# Patient Record
Sex: Male | Born: 1965
Health system: Southern US, Community
[De-identification: ages and names within clinical notes are randomized; demographics above are authoritative.]

## PROBLEM LIST (undated history)

## (undated) ENCOUNTER — Ambulatory Visit: Admission: EM | Source: Home / Self Care

## (undated) DIAGNOSIS — E042 Nontoxic multinodular goiter: Secondary | ICD-10-CM

## (undated) DIAGNOSIS — F528 Other sexual dysfunction not due to a substance or known physiological condition: Secondary | ICD-10-CM

## (undated) DIAGNOSIS — E119 Type 2 diabetes mellitus without complications: Secondary | ICD-10-CM

## (undated) DIAGNOSIS — E785 Hyperlipidemia, unspecified: Secondary | ICD-10-CM

## (undated) DIAGNOSIS — R7989 Other specified abnormal findings of blood chemistry: Secondary | ICD-10-CM

## (undated) DIAGNOSIS — J309 Allergic rhinitis, unspecified: Secondary | ICD-10-CM

## (undated) DIAGNOSIS — N183 Chronic kidney disease, stage 3 unspecified: Secondary | ICD-10-CM

## (undated) DIAGNOSIS — N189 Chronic kidney disease, unspecified: Secondary | ICD-10-CM

## (undated) DIAGNOSIS — H532 Diplopia: Secondary | ICD-10-CM

## (undated) HISTORY — DX: Other sexual dysfunction not due to a substance or known physiological condition: F52.8

## (undated) HISTORY — DX: Diplopia: H53.2

## (undated) HISTORY — DX: Hyperlipidemia, unspecified: E78.5

## (undated) HISTORY — DX: Nontoxic multinodular goiter: E04.2

## (undated) HISTORY — DX: Chronic kidney disease, unspecified: N18.9

## (undated) HISTORY — DX: Type 2 diabetes mellitus without complications: E11.9

## (undated) HISTORY — DX: Chronic kidney disease, stage 3 unspecified: N18.30

## (undated) HISTORY — DX: Allergic rhinitis, unspecified: J30.9

## (undated) HISTORY — DX: Other specified abnormal findings of blood chemistry: R79.89

---

## 1998-07-18 ENCOUNTER — Encounter: Payer: Self-pay | Admitting: Internal Medicine

## 1998-07-18 ENCOUNTER — Ambulatory Visit (HOSPITAL_COMMUNITY): Admission: RE | Admit: 1998-07-18 | Discharge: 1998-07-18 | Payer: Self-pay | Admitting: Internal Medicine

## 2002-02-08 ENCOUNTER — Encounter: Payer: Self-pay | Admitting: Family Medicine

## 2002-02-08 ENCOUNTER — Encounter: Admission: RE | Admit: 2002-02-08 | Discharge: 2002-02-08 | Payer: Self-pay | Admitting: Family Medicine

## 2003-06-11 ENCOUNTER — Encounter: Admission: RE | Admit: 2003-06-11 | Discharge: 2003-09-09 | Payer: Self-pay | Admitting: Family Medicine

## 2003-09-18 ENCOUNTER — Encounter: Admission: RE | Admit: 2003-09-18 | Discharge: 2003-09-18 | Payer: Self-pay | Admitting: Family Medicine

## 2004-07-15 ENCOUNTER — Emergency Department (HOSPITAL_COMMUNITY): Admission: EM | Admit: 2004-07-15 | Discharge: 2004-07-15 | Payer: Self-pay | Admitting: Emergency Medicine

## 2006-07-29 ENCOUNTER — Ambulatory Visit: Payer: Self-pay | Admitting: Family Medicine

## 2006-08-06 ENCOUNTER — Ambulatory Visit: Payer: Self-pay | Admitting: Family Medicine

## 2006-11-03 ENCOUNTER — Ambulatory Visit: Payer: Self-pay | Admitting: Family Medicine

## 2006-11-04 ENCOUNTER — Ambulatory Visit: Payer: Self-pay | Admitting: Family Medicine

## 2006-11-04 LAB — CONVERTED CEMR LAB
ALT: 30 units/L (ref 0–40)
AST: 38 units/L — ABNORMAL HIGH (ref 0–37)
Chol/HDL Ratio, serum: 4.9
Cholesterol: 178 mg/dL (ref 0–200)
Creatinine,U: 191.1 mg/dL
HCT: 43.8 % (ref 39.0–52.0)
HDL: 36.1 mg/dL — ABNORMAL LOW (ref >39.0)
Hemoglobin: 14.5 g/dL (ref 13.0–17.0)
Hgb A1c MFr Bld: 5.9 % (ref 4.6–6.0)
LDL Cholesterol: 109 mg/dL — ABNORMAL HIGH (ref 0–99)
MCHC: 33 g/dL (ref 30.0–36.0)
MCV: 88.1 fL (ref 78.0–100.0)
Microalb Creat Ratio: 28.3 mg/g (ref 0.0–30.0)
Microalb, Ur: 5.4 mg/dL — ABNORMAL HIGH (ref 0.0–1.9)
Platelets: 227 10*3/uL (ref 150–400)
RBC: 4.98 M/uL (ref 4.22–5.81)
RDW: 11.3 % — ABNORMAL LOW (ref 11.5–14.6)
Triglyceride fasting, serum: 164 mg/dL — ABNORMAL HIGH (ref 0–149)
VLDL: 33 mg/dL (ref 0–40)
WBC: 4.2 10*3/uL — ABNORMAL LOW (ref 4.5–10.5)

## 2006-12-07 ENCOUNTER — Ambulatory Visit: Payer: Self-pay | Admitting: Family Medicine

## 2006-12-07 LAB — CONVERTED CEMR LAB
ALT: 33 units/L (ref 0–40)
AST: 38 units/L — ABNORMAL HIGH (ref 0–37)

## 2007-02-02 ENCOUNTER — Ambulatory Visit: Payer: Self-pay | Admitting: Family Medicine

## 2007-02-02 LAB — CONVERTED CEMR LAB
ALT: 30 units/L (ref 0–40)
AST: 35 units/L (ref 0–37)
BUN: 17 mg/dL (ref 6–23)
CO2: 30 meq/L (ref 19–32)
Calcium: 9.4 mg/dL (ref 8.4–10.5)
Chloride: 106 meq/L (ref 96–112)
Cholesterol: 172 mg/dL (ref 0–200)
Creatinine, Ser: 1 mg/dL (ref 0.4–1.5)
Creatinine,U: 191.4 mg/dL
GFR calc Af Amer: 105 mL/min
GFR calc non Af Amer: 87 mL/min
Glucose, Bld: 123 mg/dL — ABNORMAL HIGH (ref 70–99)
HDL: 37.5 mg/dL — ABNORMAL LOW (ref >39.0)
Hgb A1c MFr Bld: 5.6 % (ref 4.6–6.0)
LDL Cholesterol: 103 mg/dL — ABNORMAL HIGH (ref 0–99)
Microalb Creat Ratio: 46.5 mg/g — ABNORMAL HIGH (ref 0.0–30.0)
Microalb, Ur: 8.9 mg/dL — ABNORMAL HIGH (ref 0.0–1.9)
Potassium: 3.5 meq/L (ref 3.5–5.1)
Sodium: 141 meq/L (ref 135–145)
Total CHOL/HDL Ratio: 4.6
Triglycerides: 158 mg/dL — ABNORMAL HIGH (ref 0–149)
VLDL: 32 mg/dL (ref 0–40)

## 2007-03-11 ENCOUNTER — Ambulatory Visit: Payer: Self-pay | Admitting: Family Medicine

## 2007-03-13 LAB — CONVERTED CEMR LAB
ALT: 27 units/L (ref 0–40)
AST: 34 units/L (ref 0–37)
Cholesterol: 138 mg/dL (ref 0–200)
Creatinine,U: 183.7 mg/dL
HDL: 35.8 mg/dL — ABNORMAL LOW (ref >39.0)
Hgb A1c MFr Bld: 5.7 % (ref 4.6–6.0)
LDL Cholesterol: 80 mg/dL (ref 0–99)
Microalb Creat Ratio: 30.5 mg/g — ABNORMAL HIGH (ref 0.0–30.0)
Microalb, Ur: 5.6 mg/dL — ABNORMAL HIGH (ref 0.0–1.9)
Total CHOL/HDL Ratio: 3.9
Triglycerides: 109 mg/dL (ref 0–149)
VLDL: 22 mg/dL (ref 0–40)

## 2007-03-15 DIAGNOSIS — J309 Allergic rhinitis, unspecified: Secondary | ICD-10-CM

## 2007-03-15 DIAGNOSIS — E785 Hyperlipidemia, unspecified: Secondary | ICD-10-CM | POA: Insufficient documentation

## 2007-03-15 DIAGNOSIS — F528 Other sexual dysfunction not due to a substance or known physiological condition: Secondary | ICD-10-CM

## 2007-03-15 DIAGNOSIS — E119 Type 2 diabetes mellitus without complications: Secondary | ICD-10-CM

## 2007-03-15 HISTORY — DX: Type 2 diabetes mellitus without complications: E11.9

## 2007-03-15 HISTORY — DX: Hyperlipidemia, unspecified: E78.5

## 2007-03-15 HISTORY — DX: Allergic rhinitis, unspecified: J30.9

## 2007-03-15 HISTORY — DX: Other sexual dysfunction not due to a substance or known physiological condition: F52.8

## 2007-04-12 ENCOUNTER — Telehealth (INDEPENDENT_AMBULATORY_CARE_PROVIDER_SITE_OTHER): Payer: Self-pay | Admitting: *Deleted

## 2007-04-18 ENCOUNTER — Telehealth (INDEPENDENT_AMBULATORY_CARE_PROVIDER_SITE_OTHER): Payer: Self-pay | Admitting: *Deleted

## 2007-05-06 ENCOUNTER — Ambulatory Visit: Payer: Self-pay | Admitting: Family Medicine

## 2007-08-03 ENCOUNTER — Ambulatory Visit: Payer: Self-pay | Admitting: Family Medicine

## 2007-08-04 ENCOUNTER — Encounter (INDEPENDENT_AMBULATORY_CARE_PROVIDER_SITE_OTHER): Payer: Self-pay | Admitting: *Deleted

## 2007-08-04 ENCOUNTER — Telehealth (INDEPENDENT_AMBULATORY_CARE_PROVIDER_SITE_OTHER): Payer: Self-pay | Admitting: *Deleted

## 2007-08-04 LAB — CONVERTED CEMR LAB
ALT: 35 units/L (ref 0–53)
AST: 36 units/L (ref 0–37)
BUN: 18 mg/dL (ref 6–23)
CO2: 30 meq/L (ref 19–32)
Calcium: 9.3 mg/dL (ref 8.4–10.5)
Chloride: 106 meq/L (ref 96–112)
Cholesterol: 185 mg/dL (ref 0–200)
Creatinine, Ser: 1.1 mg/dL (ref 0.4–1.5)
Creatinine,U: 219 mg/dL
GFR calc Af Amer: 94 mL/min
GFR calc non Af Amer: 78 mL/min
Glucose, Bld: 110 mg/dL — ABNORMAL HIGH (ref 70–99)
HDL: 35.8 mg/dL — ABNORMAL LOW (ref >39.0)
Hgb A1c MFr Bld: 5.5 % (ref 4.6–6.0)
LDL Cholesterol: 130 mg/dL — ABNORMAL HIGH (ref 0–99)
Microalb Creat Ratio: 22.4 mg/g (ref 0.0–30.0)
Microalb, Ur: 4.9 mg/dL — ABNORMAL HIGH (ref 0.0–1.9)
Potassium: 3.8 meq/L (ref 3.5–5.1)
Sodium: 143 meq/L (ref 135–145)
Total CHOL/HDL Ratio: 5.2
Triglycerides: 98 mg/dL (ref 0–149)
VLDL: 20 mg/dL (ref 0–40)

## 2007-08-19 ENCOUNTER — Ambulatory Visit: Payer: Self-pay | Admitting: Family Medicine

## 2007-11-21 ENCOUNTER — Ambulatory Visit: Payer: Self-pay | Admitting: Family Medicine

## 2007-11-22 ENCOUNTER — Encounter (INDEPENDENT_AMBULATORY_CARE_PROVIDER_SITE_OTHER): Payer: Self-pay | Admitting: *Deleted

## 2007-11-22 LAB — CONVERTED CEMR LAB
ALT: 39 units/L (ref 0–53)
AST: 35 units/L (ref 0–37)
Cholesterol: 117 mg/dL (ref 0–200)
HDL: 33.1 mg/dL — ABNORMAL LOW (ref >39.0)
Hgb A1c MFr Bld: 5.5 % (ref 4.6–6.0)
LDL Cholesterol: 69 mg/dL (ref 0–99)
PSA: 0.89 ng/mL (ref 0.10–4.00)
Total CHOL/HDL Ratio: 3.5
Triglycerides: 74 mg/dL (ref 0–149)
VLDL: 15 mg/dL (ref 0–40)

## 2008-02-23 ENCOUNTER — Encounter: Payer: Self-pay | Admitting: Internal Medicine

## 2008-02-28 ENCOUNTER — Ambulatory Visit: Payer: Self-pay | Admitting: Internal Medicine

## 2008-03-07 ENCOUNTER — Telehealth (INDEPENDENT_AMBULATORY_CARE_PROVIDER_SITE_OTHER): Payer: Self-pay | Admitting: *Deleted

## 2008-03-07 LAB — CONVERTED CEMR LAB
ALT: 43 units/L (ref 0–53)
AST: 41 units/L — ABNORMAL HIGH (ref 0–37)
BUN: 25 mg/dL — ABNORMAL HIGH (ref 6–23)
Basophils Absolute: 0 10*3/uL (ref 0.0–0.1)
Basophils Relative: 0.8 % (ref 0.0–1.0)
CO2: 30 meq/L (ref 19–32)
Calcium: 9.2 mg/dL (ref 8.4–10.5)
Chloride: 105 meq/L (ref 96–112)
Cholesterol: 155 mg/dL (ref 0–200)
Creatinine, Ser: 1.1 mg/dL (ref 0.4–1.5)
Eosinophils Absolute: 0.2 10*3/uL (ref 0.0–0.7)
Eosinophils Relative: 4.2 % (ref 0.0–5.0)
GFR calc Af Amer: 94 mL/min
GFR calc non Af Amer: 77 mL/min
Glucose, Bld: 129 mg/dL — ABNORMAL HIGH (ref 70–99)
HCT: 41.4 % (ref 39.0–52.0)
HDL: 30.8 mg/dL — ABNORMAL LOW (ref >39.0)
Hemoglobin: 13.7 g/dL (ref 13.0–17.0)
LDL Cholesterol: 109 mg/dL — ABNORMAL HIGH (ref 0–99)
Lymphocytes Relative: 25.9 % (ref 12.0–46.0)
MCHC: 33.1 g/dL (ref 30.0–36.0)
MCV: 88.1 fL (ref 78.0–100.0)
Monocytes Absolute: 0.8 10*3/uL (ref 0.1–1.0)
Monocytes Relative: 19.4 % — ABNORMAL HIGH (ref 3.0–12.0)
Neutro Abs: 2 10*3/uL (ref 1.4–7.7)
Neutrophils Relative %: 49.7 % (ref 43.0–77.0)
Platelets: 198 10*3/uL (ref 150–400)
Potassium: 3.6 meq/L (ref 3.5–5.1)
RBC: 4.69 M/uL (ref 4.22–5.81)
RDW: 11.8 % (ref 11.5–14.6)
Sodium: 140 meq/L (ref 135–145)
TSH: 0.69 microintl units/mL (ref 0.35–5.50)
Total CHOL/HDL Ratio: 5
Triglycerides: 75 mg/dL (ref 0–149)
VLDL: 15 mg/dL (ref 0–40)
WBC: 4 10*3/uL — ABNORMAL LOW (ref 4.5–10.5)

## 2008-03-13 ENCOUNTER — Ambulatory Visit: Payer: Self-pay | Admitting: Internal Medicine

## 2008-06-14 ENCOUNTER — Ambulatory Visit: Payer: Self-pay | Admitting: Internal Medicine

## 2008-06-19 ENCOUNTER — Telehealth (INDEPENDENT_AMBULATORY_CARE_PROVIDER_SITE_OTHER): Payer: Self-pay | Admitting: *Deleted

## 2008-06-19 LAB — CONVERTED CEMR LAB
ALT: 65 units/L — ABNORMAL HIGH (ref 0–53)
AST: 52 units/L — ABNORMAL HIGH (ref 0–37)
Cholesterol: 191 mg/dL (ref 0–200)
HDL: 33.3 mg/dL — ABNORMAL LOW (ref >39.0)
Hgb A1c MFr Bld: 5.9 % (ref 4.6–6.0)
LDL Cholesterol: 140 mg/dL — ABNORMAL HIGH (ref 0–99)
Total CHOL/HDL Ratio: 5.7
Triglycerides: 87 mg/dL (ref 0–149)
VLDL: 17 mg/dL (ref 0–40)

## 2008-11-02 ENCOUNTER — Telehealth (INDEPENDENT_AMBULATORY_CARE_PROVIDER_SITE_OTHER): Payer: Self-pay | Admitting: *Deleted

## 2008-11-28 ENCOUNTER — Ambulatory Visit: Payer: Self-pay | Admitting: Internal Medicine

## 2008-11-28 LAB — CONVERTED CEMR LAB
Creatinine,U: 209.1 mg/dL
Hgb A1c MFr Bld: 5.8 % (ref 4.6–6.0)
Microalb Creat Ratio: 47.8 mg/g — ABNORMAL HIGH (ref 0.0–30.0)
Microalb, Ur: 10 mg/dL — ABNORMAL HIGH (ref 0.0–1.9)

## 2008-12-03 ENCOUNTER — Telehealth (INDEPENDENT_AMBULATORY_CARE_PROVIDER_SITE_OTHER): Payer: Self-pay | Admitting: *Deleted

## 2008-12-03 ENCOUNTER — Ambulatory Visit: Payer: Self-pay | Admitting: Internal Medicine

## 2008-12-05 ENCOUNTER — Ambulatory Visit: Payer: Self-pay | Admitting: Internal Medicine

## 2008-12-10 ENCOUNTER — Encounter: Payer: Self-pay | Admitting: Internal Medicine

## 2008-12-10 LAB — CONVERTED CEMR LAB
BUN: 18 mg/dL (ref 6–23)
CO2: 31 meq/L (ref 19–32)
Calcium: 9.3 mg/dL (ref 8.4–10.5)
Chloride: 106 meq/L (ref 96–112)
Cholesterol: 179 mg/dL (ref 0–200)
Creatinine, Ser: 1.1 mg/dL (ref 0.4–1.5)
GFR calc Af Amer: 94 mL/min
GFR calc non Af Amer: 77 mL/min
Glucose, Bld: 122 mg/dL — ABNORMAL HIGH (ref 70–99)
HDL: 35.4 mg/dL — ABNORMAL LOW (ref >39.0)
Hgb A1c MFr Bld: 5.8 % (ref 4.6–6.0)
LDL Cholesterol: 123 mg/dL — ABNORMAL HIGH (ref 0–99)
Potassium: 3.6 meq/L (ref 3.5–5.1)
Sodium: 141 meq/L (ref 135–145)
Total CHOL/HDL Ratio: 5.1
Triglycerides: 101 mg/dL (ref 0–149)
VLDL: 20 mg/dL (ref 0–40)

## 2009-02-26 ENCOUNTER — Ambulatory Visit: Payer: Self-pay | Admitting: Internal Medicine

## 2009-06-04 ENCOUNTER — Encounter: Payer: Self-pay | Admitting: Internal Medicine

## 2009-06-04 LAB — HM DIABETES EYE EXAM: HM Diabetic Eye Exam: NORMAL

## 2009-06-24 ENCOUNTER — Encounter: Payer: Self-pay | Admitting: Internal Medicine

## 2009-06-27 ENCOUNTER — Ambulatory Visit: Payer: Self-pay | Admitting: Internal Medicine

## 2009-07-03 ENCOUNTER — Telehealth: Payer: Self-pay | Admitting: Internal Medicine

## 2009-07-03 LAB — CONVERTED CEMR LAB
ALT: 31 units/L (ref 0–53)
AST: 33 units/L (ref 0–37)
BUN: 19 mg/dL (ref 6–23)
Basophils Absolute: 0 10*3/uL (ref 0.0–0.1)
Basophils Relative: 0.1 % (ref 0.0–3.0)
CO2: 29 meq/L (ref 19–32)
Calcium: 9.4 mg/dL (ref 8.4–10.5)
Chloride: 101 meq/L (ref 96–112)
Cholesterol: 182 mg/dL (ref 0–200)
Creatinine, Ser: 1.1 mg/dL (ref 0.4–1.5)
Creatinine,U: 185.3 mg/dL
Eosinophils Absolute: 0.2 10*3/uL (ref 0.0–0.7)
Eosinophils Relative: 3.2 % (ref 0.0–5.0)
GFR calc non Af Amer: 92.95 mL/min (ref >60)
Glucose, Bld: 109 mg/dL — ABNORMAL HIGH (ref 70–99)
HCT: 40.2 % (ref 39.0–52.0)
HDL: 35.6 mg/dL — ABNORMAL LOW (ref >39.00)
Hemoglobin: 13.9 g/dL (ref 13.0–17.0)
LDL Cholesterol: 126 mg/dL — ABNORMAL HIGH (ref 0–99)
Lymphocytes Relative: 18 % (ref 12.0–46.0)
Lymphs Abs: 0.9 10*3/uL (ref 0.7–4.0)
MCHC: 34.6 g/dL (ref 30.0–36.0)
MCV: 85.2 fL (ref 78.0–100.0)
Microalb Creat Ratio: 20.5 mg/g (ref 0.0–30.0)
Microalb, Ur: 3.8 mg/dL — ABNORMAL HIGH (ref 0.0–1.9)
Monocytes Absolute: 0.9 10*3/uL (ref 0.1–1.0)
Monocytes Relative: 18.4 % — ABNORMAL HIGH (ref 3.0–12.0)
Neutro Abs: 2.8 10*3/uL (ref 1.4–7.7)
Neutrophils Relative %: 60.3 % (ref 43.0–77.0)
PSA: 0.91 ng/mL (ref 0.10–4.00)
Platelets: 205 10*3/uL (ref 150.0–400.0)
Potassium: 3.8 meq/L (ref 3.5–5.1)
RBC: 4.71 M/uL (ref 4.22–5.81)
RDW: 12 % (ref 11.5–14.6)
Sodium: 138 meq/L (ref 135–145)
TSH: 0.4 microintl units/mL (ref 0.35–5.50)
Total CHOL/HDL Ratio: 5
Triglycerides: 104 mg/dL (ref 0.0–149.0)
VLDL: 20.8 mg/dL (ref 0.0–40.0)
WBC: 4.8 10*3/uL (ref 4.5–10.5)

## 2009-07-04 ENCOUNTER — Encounter (INDEPENDENT_AMBULATORY_CARE_PROVIDER_SITE_OTHER): Payer: Self-pay | Admitting: *Deleted

## 2009-07-04 LAB — CONVERTED CEMR LAB: Hgb A1c MFr Bld: 5.6 % (ref 4.6–6.5)

## 2009-07-12 ENCOUNTER — Ambulatory Visit: Payer: Self-pay | Admitting: Internal Medicine

## 2009-07-12 LAB — HM DIABETES FOOT EXAM

## 2009-11-20 ENCOUNTER — Ambulatory Visit: Payer: Self-pay | Admitting: Internal Medicine

## 2009-11-21 ENCOUNTER — Ambulatory Visit: Payer: Self-pay | Admitting: Internal Medicine

## 2009-11-25 LAB — CONVERTED CEMR LAB
ALT: 34 units/L (ref 0–53)
AST: 28 units/L (ref 0–37)
Cholesterol: 119 mg/dL (ref 0–200)
HDL: 35.4 mg/dL — ABNORMAL LOW (ref >39.00)
Hgb A1c MFr Bld: 9 % — ABNORMAL HIGH (ref 4.6–6.5)
LDL Cholesterol: 64 mg/dL (ref 0–99)
Total CHOL/HDL Ratio: 3
Triglycerides: 97 mg/dL (ref 0.0–149.0)
VLDL: 19.4 mg/dL (ref 0.0–40.0)

## 2009-12-03 ENCOUNTER — Ambulatory Visit: Payer: Self-pay | Admitting: Internal Medicine

## 2009-12-04 LAB — CONVERTED CEMR LAB: Hgb A1c MFr Bld: 10.3 % — ABNORMAL HIGH (ref 4.6–6.5)

## 2009-12-09 ENCOUNTER — Ambulatory Visit: Payer: Self-pay | Admitting: Internal Medicine

## 2009-12-09 ENCOUNTER — Telehealth (INDEPENDENT_AMBULATORY_CARE_PROVIDER_SITE_OTHER): Payer: Self-pay | Admitting: *Deleted

## 2009-12-09 ENCOUNTER — Encounter (INDEPENDENT_AMBULATORY_CARE_PROVIDER_SITE_OTHER): Payer: Self-pay | Admitting: *Deleted

## 2009-12-09 LAB — CONVERTED CEMR LAB: Blood Glucose, Fingerstick: 422

## 2009-12-11 ENCOUNTER — Telehealth (INDEPENDENT_AMBULATORY_CARE_PROVIDER_SITE_OTHER): Payer: Self-pay | Admitting: *Deleted

## 2009-12-13 ENCOUNTER — Telehealth (INDEPENDENT_AMBULATORY_CARE_PROVIDER_SITE_OTHER): Payer: Self-pay | Admitting: *Deleted

## 2009-12-16 ENCOUNTER — Telehealth (INDEPENDENT_AMBULATORY_CARE_PROVIDER_SITE_OTHER): Payer: Self-pay | Admitting: *Deleted

## 2009-12-23 ENCOUNTER — Telehealth (INDEPENDENT_AMBULATORY_CARE_PROVIDER_SITE_OTHER): Payer: Self-pay | Admitting: *Deleted

## 2009-12-24 ENCOUNTER — Ambulatory Visit: Payer: Self-pay | Admitting: Internal Medicine

## 2009-12-24 DIAGNOSIS — H532 Diplopia: Secondary | ICD-10-CM

## 2009-12-24 HISTORY — DX: Diplopia: H53.2

## 2009-12-30 ENCOUNTER — Ambulatory Visit: Payer: Self-pay | Admitting: Endocrinology

## 2010-01-01 ENCOUNTER — Telehealth: Payer: Self-pay | Admitting: Endocrinology

## 2010-01-09 ENCOUNTER — Encounter: Admission: RE | Admit: 2010-01-09 | Discharge: 2010-01-09 | Payer: Self-pay | Admitting: Endocrinology

## 2010-01-13 ENCOUNTER — Ambulatory Visit: Payer: Self-pay | Admitting: Endocrinology

## 2010-03-21 ENCOUNTER — Ambulatory Visit: Payer: Self-pay | Admitting: Endocrinology

## 2010-03-21 DIAGNOSIS — E042 Nontoxic multinodular goiter: Secondary | ICD-10-CM | POA: Insufficient documentation

## 2010-03-21 HISTORY — DX: Nontoxic multinodular goiter: E04.2

## 2010-03-21 LAB — CONVERTED CEMR LAB
Cholesterol: 157 mg/dL (ref 0–200)
HDL: 40.9 mg/dL (ref >39.00)
Hgb A1c MFr Bld: 7.3 % — ABNORMAL HIGH (ref 4.6–6.5)
LDL Cholesterol: 97 mg/dL (ref 0–99)
Total CHOL/HDL Ratio: 4
Triglycerides: 95 mg/dL (ref 0.0–149.0)
VLDL: 19 mg/dL (ref 0.0–40.0)

## 2010-06-19 ENCOUNTER — Ambulatory Visit: Payer: Self-pay | Admitting: Endocrinology

## 2010-06-19 LAB — CONVERTED CEMR LAB
Cholesterol: 132 mg/dL (ref 0–200)
Direct LDL: 56.7 mg/dL
HDL: 31.6 mg/dL — ABNORMAL LOW (ref >39.00)
Hgb A1c MFr Bld: 8.4 % — ABNORMAL HIGH (ref 4.6–6.5)
Total CHOL/HDL Ratio: 4
Triglycerides: 224 mg/dL — ABNORMAL HIGH (ref 0.0–149.0)
VLDL: 44.8 mg/dL — ABNORMAL HIGH (ref 0.0–40.0)

## 2010-09-22 ENCOUNTER — Ambulatory Visit: Payer: Self-pay | Admitting: Endocrinology

## 2010-09-22 LAB — CONVERTED CEMR LAB: Hgb A1c MFr Bld: 8.1 % — ABNORMAL HIGH (ref 4.6–6.5)

## 2010-11-12 ENCOUNTER — Ambulatory Visit
Admission: RE | Admit: 2010-11-12 | Discharge: 2010-11-12 | Payer: Self-pay | Source: Home / Self Care | Attending: Internal Medicine | Admitting: Internal Medicine

## 2010-11-12 ENCOUNTER — Other Ambulatory Visit: Payer: Self-pay | Admitting: Internal Medicine

## 2010-11-12 LAB — CBC WITH DIFFERENTIAL/PLATELET
Basophils Absolute: 0 10*3/uL (ref 0.0–0.1)
Basophils Relative: 0.9 % (ref 0.0–3.0)
Eosinophils Absolute: 0.4 10*3/uL (ref 0.0–0.7)
Eosinophils Relative: 8.4 % — ABNORMAL HIGH (ref 0.0–5.0)
HCT: 37.7 % — ABNORMAL LOW (ref 39.0–52.0)
Hemoglobin: 13.1 g/dL (ref 13.0–17.0)
Lymphocytes Relative: 17.1 % (ref 12.0–46.0)
Lymphs Abs: 0.8 10*3/uL (ref 0.7–4.0)
MCHC: 34.9 g/dL (ref 30.0–36.0)
MCV: 88 fl (ref 78.0–100.0)
Monocytes Absolute: 0.9 10*3/uL (ref 0.1–1.0)
Monocytes Relative: 20 % — ABNORMAL HIGH (ref 3.0–12.0)
Neutro Abs: 2.4 10*3/uL (ref 1.4–7.7)
Neutrophils Relative %: 53.6 % (ref 43.0–77.0)
Platelets: 214 10*3/uL (ref 150.0–400.0)
RBC: 4.28 Mil/uL (ref 4.22–5.81)
RDW: 12.1 % (ref 11.5–14.6)
WBC: 4.4 10*3/uL — ABNORMAL LOW (ref 4.5–10.5)

## 2010-11-12 LAB — HEPATIC FUNCTION PANEL
ALT: 72 U/L — ABNORMAL HIGH (ref 0–53)
AST: 52 U/L — ABNORMAL HIGH (ref 0–37)
Albumin: 4.3 g/dL (ref 3.5–5.2)
Alkaline Phosphatase: 96 U/L (ref 39–117)
Bilirubin, Direct: 0.2 mg/dL (ref 0.0–0.3)
Total Bilirubin: 1.4 mg/dL — ABNORMAL HIGH (ref 0.3–1.2)
Total Protein: 8 g/dL (ref 6.0–8.3)

## 2010-11-12 LAB — PSA: PSA: 1.02 ng/mL (ref 0.10–4.00)

## 2010-11-12 LAB — BASIC METABOLIC PANEL
BUN: 27 mg/dL — ABNORMAL HIGH (ref 6–23)
CO2: 28 mEq/L (ref 19–32)
Calcium: 10.2 mg/dL (ref 8.4–10.5)
Chloride: 105 mEq/L (ref 96–112)
Creatinine, Ser: 1.3 mg/dL (ref 0.4–1.5)
GFR: 76.19 mL/min (ref >60.00)
Glucose, Bld: 80 mg/dL (ref 70–99)
Potassium: 4.5 mEq/L (ref 3.5–5.1)
Sodium: 139 mEq/L (ref 135–145)

## 2010-11-27 NOTE — Assessment & Plan Note (Signed)
Summary: 3 MO FU -OYU   Vital Signs:  Patient profile:   45 year old male Height:      72 inches (182.88 cm) Weight:      213.50 pounds (97.05 kg) BMI:     29.06 O2 Sat:      94 % on Room air Temp:     97.2 degrees F (36.22 degrees C) oral Pulse rate:   70 / minute BP sitting:   120 / 78  (left arm) Cuff size:   large  Vitals Entered By: Brenton Grills MA (June 19, 2010 8:12 AM)  O2 Flow:  Room air CC: 3 month F/U/aj Is Patient Diabetic? Yes   Referring Provider:  Willow Ora MD Primary Provider:  Nolon Rod. Paz MD  CC:  3 month F/U/aj.  History of Present Illness: the status of at least 3 ongoing medical problems is addressed today: dm:  pt states he feels well in general.  no cbg record, but states cbg's vary from 78 (10 am, after he took am insulin but did not eat) up to 200's (am).  no hypoglycemic sxs dyslipidemia:  he takes zocor as rx'ed allergic rhinitis:  nasonex works well, but it is expensive.  Current Medications (verified): 1)  Lisinopril 10 Mg  Tabs (Lisinopril) .... Take One Tablet Daily 2)  Nasonex 50 Mcg/act Susp (Mometasone Furoate) .... 2 Sprays On Each Side Once Daily 3)  Claritin 10 Mg Tabs (Loratadine) .Marland Kitchen.. 1 By Mouth At Bedtime As Needed Allergies 4)  One Touch Ultra Test Strips .... Checks Bx 3x/day Dx 250.00 5)  Pen Needles 5/16" 31g X 8 Mm Misc (Insulin Pen Needle) .... As Directed 6)  Onetouch Delica Lancets  Misc (Lancets) .... Checks Blood Sugar 3x/day Dx 250.00 7)  Humalog Mix 75/25 Kwikpen 75-25 % Susp (Insulin Lispro Prot & Lispro) .... 40 Units Each Am, and Pen Needles Once Daily 8)  Simvastatin 80 Mg Tabs (Simvastatin) .Marland Kitchen.. 1 Once Daily  Allergies (verified): No Known Drug Allergies  Past History:  Past Medical History: Last updated: 11/20/2009 Allergic rhinitis Diabetes mellitus, type II HYPERLIPIDEMIA  ERECTILE DYSFUNCTION   Review of Systems  The patient denies weight loss, weight gain, and syncope.    Physical  Exam  General:  normal appearance.   Pulses:  dorsalis pedis intact bilat.   Extremities:  no deformity.  no ulcer on the feet.  feet are of normal color and temp.  no edema  Neurologic:  sensation is intact to touch on the feet  Additional Exam:  Hemoglobin A1C       [H]  8.4 %                       4.6-6.5  Cholesterol LDL   56.7 mg/dL   Impression & Recommendations:  Problem # 1:  DIABETES MELLITUS, TYPE II (ICD-250.00) needs increased rx  Problem # 2:  ALLERGIC RHINITIS (ICD-477.9) well-controlled  Problem # 3:  HYPERLIPIDEMIA (ICD-272.4) well-controlled  Medications Added to Medication List This Visit: 1)  Humalog Mix 75/25 Kwikpen 75-25 % Susp (Insulin lispro prot & lispro) .... 45 units each am, and pen needles once daily 2)  Fluticasone Propionate 50 Mcg/act Susp (Fluticasone propionate) .Marland Kitchen.. 1 spray each nostril once daily  Other Orders: TLB-Lipid Panel (80061-LIPID) TLB-A1C / Hgb A1C (Glycohemoglobin) (83036-A1C) Est. Patient Level IV (03474)  Patient Instructions: 1)  blood tests are being ordered for you today.  please call [phone] to hear your test results.  2)  pending the test results, please continue humalog 75/25, 40 units once daily. 3)  Please schedule a follow-up appointment in 3 months. 4)  change nasonex to fluticasone spray 1 spray each nostril once daily. 5)  (update: i left message on phone-tree:  increase humalog 75/25 to 45 units each am). Prescriptions: FLUTICASONE PROPIONATE 50 MCG/ACT SUSP (FLUTICASONE PROPIONATE) 1 spray each nostril once daily  #1 device x 11   Entered and Authorized by:   Minus Breeding MD   Signed by:   Minus Breeding MD on 06/19/2010   Method used:   Electronically to        [facility] # [phone]* (retail)       [address] [place], [place]  [address]       Ph: [phone]       Fax: [phone]   RxID:   [id number]

## 2010-11-27 NOTE — Progress Notes (Signed)
Summary: redraw labs  Phone Note From Other Clinic   Caller: Tonya Call For: Chemira Summary of Call: Received labs that were drawn today and the green showed a pour off from the purple.  Labs need to be recollected per Clydie Braun. (which means that when the blood was drawn it was poured from a purple tube into a green tube).  Patient needs to be called and have him come back in. Ardyth Man  December 03, 2008 5:28 PM  Initial call taken by: Ardyth Man,  December 03, 2008 5:28 PM  Follow-up for Phone Call        left message on machine .............Marland KitchenDoristine Devoid  December 04, 2008 8:42 AM   spoke with patient will comeback for labs to be done tomorrow informed to be done at no charge............Marland KitchenDoristine Devoid  December 04, 2008 9:25 AM

## 2010-11-27 NOTE — Assessment & Plan Note (Signed)
Summary: 3 month f/u//ca   Vital Signs:  Patient Profile:   45 Years Old Male Height:     72 inches Weight:      207.8 pounds BMI:     28.28 Pulse rate:   68 / minute BP sitting:   118 / 70  Vitals Entered By: Shary Decamp (June 14, 2008 11:13 AM)                 Chief Complaint:  rov -- avg fasting BS 114-120.  History of Present Illness: f/u    Prior Medication List:  ACTOPLUS MET 15-500 MG  TABS (PIOGLITAZONE HCL-METFORMIN HCL) 1 by mouth two times a day LISINOPRIL 10 MG  TABS (LISINOPRIL) TAKE ONE TABLET DAILY LIPITOR 10 MG  TABS (ATORVASTATIN CALCIUM) TAKE ONE TABLET DAILY VERAMYST 27.5 MCG/SPRAY  SUSP (FLUTICASONE FUROATE) as directed   Current Allergies (reviewed today): No known allergies   Past Medical History:    Reviewed history from 03/13/2008 and no changes required:       Allergic rhinitis       Diabetes mellitus, type II       HYPERLIPIDEMIA (ICD-272.4)       ERECTILE DYSFUNCTION (ICD-302.72)         Past Surgical History:    Reviewed history from 03/13/2008 and no changes required:       no     Review of Systems       now on Actos  denies swelling--SOB-nausea CBGs -- fasting are 114-120 (thinks better in Actos than in Avandia)   Physical Exam  General:     alert and well-developed.   Lungs:     normal respiratory effort, no intercostal retractions, no accessory muscle use, and normal breath sounds.   Heart:     normal rate, regular rhythm, and no murmur.   Extremities:     no pretibial edema bilaterally     Impression & Recommendations:  Problem # 1:  HYPERLIPIDEMIA (ICD-272.4) labs will see if profile improved now that he is on  actos His updated medication list for this problem includes:    Lipitor 10 Mg Tabs (Atorvastatin calcium) .Marland Kitchen... Take one tablet daily  Orders: Venipuncture (16109) TLB-Lipid Panel (80061-LIPID) TLB-ALT (SGPT) (84460-ALT) TLB-AST (SGOT) (84450-SGOT)   Problem # 2:  DIABETES MELLITUS, TYPE  II (ICD-250.00) recently switched from avandia labs His updated medication list for this problem includes:    Actoplus Met 15-500 Mg Tabs (Pioglitazone hcl-metformin hcl) .Marland Kitchen... 1 by mouth two times a day    Lisinopril 10 Mg Tabs (Lisinopril) .Marland Kitchen... Take one tablet daily  Orders: TLB-A1C / Hgb A1C (Glycohemoglobin) (83036-A1C)   Complete Medication List: 1)  Actoplus Met 15-500 Mg Tabs (Pioglitazone hcl-metformin hcl) .Marland Kitchen.. 1 by mouth two times a day 2)  Lisinopril 10 Mg Tabs (Lisinopril) .... Take one tablet daily 3)  Lipitor 10 Mg Tabs (Atorvastatin calcium) .... Take one tablet daily 4)  Veramyst 27.5 Mcg/spray Susp (Fluticasone furoate) .... As directed   Patient Instructions: 1)  Please schedule a follow-up appointment in 4 months.   ]

## 2010-11-27 NOTE — Progress Notes (Signed)
Summary: FYI FASTING CBG  Phone Note Call from Patient Call back at [facility] Phone [phone]   Caller: Patient Summary of Call: pt called with fasting CBG.  BS this am fasting =377,   BS fasting yesterday am =334 .   Pt called back says checked BS  2hours after breakfast and it was 452  .Kandice Hams  December 13, 2009 8:31 AM  Initial call taken by: Kandice Hams,  December 13, 2009 8:31 AM  Follow-up for Phone Call        increase Lantus from 20 to 25 units call with CBGs  in 3 days Follow-up by: [facility] E. Paz MD,  December 13, 2009 10:19 AM  Additional Follow-up for Phone Call Additional follow up Details #1::        pt informed of Dr Drue Novel recommnedations re; CBGs .Kandice Hams  December 13, 2009 11:05 AM  Additional Follow-up by: Kandice Hams,  December 13, 2009 11:06 AM

## 2010-11-27 NOTE — Progress Notes (Signed)
Summary: Insulin  Phone Note Call from Patient Call back at Home Phone [phone]   Caller: Patient Summary of Call: pt was told to stopped Janumet and take 30u of insulin at bedtime. pt states that he increase insulin by 5u because insulin was in the high 200s but it has not helped. please advise Initial call taken by: Margaret Pyle, CMA,  January 01, 2010 4:50 PM  Follow-up for Phone Call        increase to 45 units once daily  Follow-up by: Minus Breeding MD,  January 01, 2010 5:47 PM  Additional Follow-up for Phone Call Additional follow up Details #1::        left mesage on pt's vm to call the office back Additional Follow-up by: Sherese ChristopherMarch 10, 2011 8:21 AM    Additional Follow-up for Phone Call Additional follow up Details #2::    spoke to pt, and informed him of the above. Follow-up by: Sherese Christopher January 02, 2010 11:01 AM  New/Updated Medications: LANTUS SOLOSTAR 100 UNIT/ML SOLN (INSULIN GLARGINE) 45 units at bedtime

## 2010-11-27 NOTE — Assessment & Plan Note (Signed)
Summary: elevated BS, followup from ED/alr   Vital Signs:  Patient profile:   45 year old [place] Height:      72 inches Weight:      208 pounds BMI:     28.31 Pulse rate:   88 / minute BP sitting:   136 / 80  Vitals Entered By: Shary Decamp (December 09, 2009 12:48 PM) CC: elevated glucose CBG Result 422 Comments  - went to ED over weekend for CBG 619, was given insulin  - fasting @ 5:31am CBG was 324  - 9:12am today - 378  - pt states that he is lightheaded & after eating wants to sleep Shary Decamp  December 09, 2009 12:50 PM    History of Present Illness: went to the ER 12/06/2009  [facility] [place]) labs are reviewed: Blood sugar is 619, potassium 3.5, sodium 130, creatinine 1.3 urinalysis without ketones hemoglobin A1c is 11.6 WBCs 4.8, hemoglobin 14.0, platelets 193 he received NPH 40 units, regular insulin 10 units  (unable to scan the  copies due to the poor quality of the fax)  Current Medications (verified): 1)  Lisinopril 10 Mg  Tabs (Lisinopril) .... Take One Tablet Daily 2)  Janumet 50-1000 Mg Tabs (Sitagliptin-Metformin Hcl) .Marland Kitchen.. 1 By Mouth Two Times A Day 3)  Simvastatin 20 Mg Tabs (Simvastatin) .Marland Kitchen.. 1 By Mouth At Bedtime 4)  Nasonex 50 Mcg/act Susp (Mometasone Furoate) .... 2 Sprays On Each Side Once Daily 5)  Claritin 10 Mg Tabs (Loratadine) .Marland Kitchen.. 1 By Mouth At Bedtime As Needed Allergies  Allergies (verified): No Known Drug Allergies  Past History:  Past Medical History: Reviewed history from 11/20/2009 and no changes required. Allergic rhinitis Diabetes mellitus, type II HYPERLIPIDEMIA  ERECTILE DYSFUNCTION   Past Surgical History: Reviewed history from 03/13/2008 and no changes required. no  Review of Systems       few hours after the ER visit (after  40 units of NPH)  his   CBG went down to 184 his blood sugars has been around 300 to 340  fasting @ 5:31am today CBG was 324 9:12am today - 378, after breakfast  - pt states that he is lightheaded  & after eating wants to sleep  Physical Exam  General:  alert, well-developed, and well-nourished.  no apparent distress Psych:  Cognition and judgment appear intact. Alert and cooperative with normal attention span and concentration.     Impression & Recommendations:  Problem # 1:  DIABETES MELLITUS, TYPE II (ICD-250.00) poorly controlled discussed with the patient the need to start insulin I am not sure if he will be able to go back to oral medications alone  for now will start Lantus 15 units, first dose now, then one shot every night review with the patient in low blood sugar symptoms see instructions His updated medication list for this problem includes:    Lisinopril 10 Mg Tabs (Lisinopril) .Marland Kitchen... Take one tablet daily    Janumet 50-1000 Mg Tabs (Sitagliptin-metformin hcl) .Marland Kitchen... 1 by mouth two times a day    Lantus Solostar 100 Unit/ml Soln (Insulin glargine) .Marland KitchenMarland KitchenMarland KitchenMarland Kitchen 15 u at bedtime  Complete Medication List: 1)  Lisinopril 10 Mg Tabs (Lisinopril) .... Take one tablet daily 2)  Janumet 50-1000 Mg Tabs (Sitagliptin-metformin hcl) .Marland Kitchen.. 1 by mouth two times a day 3)  Simvastatin 20 Mg Tabs (Simvastatin) .Marland Kitchen.. 1 by mouth at bedtime 4)  Nasonex 50 Mcg/act Susp (Mometasone furoate) .... 2 sprays on each side once daily 5)  Claritin 10 Mg  Tabs (Loratadine) .Marland Kitchen.. 1 by mouth at bedtime as needed allergies 6)  One Touch Ultra Test Strips  .... Checks bx 3x/day dx 250.00 7)  Lantus Solostar 100 Unit/ml Soln (Insulin glargine) .Marland Kitchen.. 15 u at bedtime 8)  Pen Needles 5/16" 31g X 8 Mm Misc (Insulin pen needle) .... As directed 9)  Onetouch Delica Lancets Misc (Lancets) .... Checks blood sugar 3x/day dx 250.00  Other Orders: Capillary Blood Glucose/CBG (04540) Capillary Blood Glucose/CBG (98119)  Patient Instructions: 1)  take Lantus 15 units every night 2)  check your sugar  two or 3 times a day 3)  check the sugar every morning for sure  4)  call with readings in two days 5)  watch for   low sugar symptoms 6)  continue all other medications as before 7)  Please schedule a follow-up appointment in 1 month.  Prescriptions: ONETOUCH DELICA LANCETS  MISC (LANCETS) checks blood sugar 3x/day dx 250.00  #100 x 0   Entered by:   Shary Decamp   Authorized by:   Nolon Rod. Osker Ayoub MD   Signed by:   Shary Decamp on 12/09/2009   Method used:   Print then Give to Patient   RxID:   [id number] PEN NEEDLES 5/16" 31G X 8 MM MISC (INSULIN PEN NEEDLE) as directed  #100 x 1   Entered by:   Shary Decamp   Authorized by:   Nolon Rod. Clyda Smyth MD   Signed by:   Shary Decamp on 12/09/2009   Method used:   Print then Give to Patient   RxID:   [id number] LANTUS SOLOSTAR 100 UNIT/ML SOLN (INSULIN GLARGINE) 15 u at bedtime  #1 mo supply x 1   Entered by:   Shary Decamp   Authorized by:   Nolon Rod. Cindra Austad MD   Signed by:   Shary Decamp on 12/09/2009   Method used:   Print then Give to Patient   RxID:   [id number] ONE TOUCH ULTRA TEST STRIPS checks bx 3x/day dx 250.00  #100 x 11   Entered by:   Shary Decamp   Authorized by:   Nolon Rod. Mckala Pantaleon MD   Signed by:   Shary Decamp on 12/09/2009   Method used:   Print then Give to Patient   RxID:   [id number]

## 2010-11-27 NOTE — Progress Notes (Signed)
  Phone Note Call from Patient   Caller: Patient Summary of Call: pt went to ED [place]  friday night, BS went  up to 619, was given 40 unit IV, and a 10 unit shot per patient BS this am 331. Pt is on Janumet 50-1000  bid   Ov scheduled Initial call taken by: Kandice Hams,  December 09, 2009 9:06 AM

## 2010-11-27 NOTE — Assessment & Plan Note (Signed)
Summary: 3 MONTH OV    PH   Vital Signs:  Patient Profile:   45 Years Old Male Weight:      210 pounds Temp:     98 degrees F oral Pulse rate:   72 / minute Resp:     14 per minute BP sitting:   102 / 70  (right arm)  Pt. in pain?   no  Vitals Entered By: Ardyth Man (May 06, 2007 12:49 PM)                Chief Complaint:  3 month follow up and Type 2 diabetes mellitus follow-up.  History of Present Illness:  Type 2 Diabetes Mellitus Follow-Up      This is a 45 year old man who presents for Type 2 diabetes mellitus follow-up.  The patient denies polyuria, polydipsia, blurred vision, self managed hypoglycemia, hypoglycemia requiring help, weight loss, weight gain, and numbness of extremities.  The patient denies the following symptoms: neuropathic pain, chest pain, vomiting, orthostatic symptoms, intermittent claudication, vision loss, and foot ulcer.  Since the last visit the patient reports good dietary compliance, exercising regularly, and monitoring blood glucose.  The patient has been measuring capillary blood glucose before breakfast and after dinner.  Since the last visit, the patient reports having had eye care by an ophthalmologist.   Missed several days secondary to confusion with medication at the pharmacy.        Hyperlipidemia Follow-Up      The patient also presents for Hyperlipidemia follow-up.  The patient denies muscle aches, GI upset, constipation, and fatigue.  The patient denies the following symptoms: chest pain/pressure.  Compliance with medications (by patient report) has been near 100%.  Dietary compliance has been good.          Physical Exam  General:     overweight-appearing.   Neck:     No deformities, masses, or tenderness noted. Lungs:     Normal respiratory effort, chest expands symmetrically. Lungs are clear to auscultation, no crackles or wheezes. Heart:     Normal rate and regular rhythm. S1 and S2 normal without gallop, murmur,  click, rub or other extra sounds.  Diabetes Management Exam:    Foot Exam (with socks and/or shoes not present):       Sensory-Pinprick/Light touch:          Left medial foot (L-4): normal          Left dorsal foot (L-5): normal          Left lateral foot (S-1): normal          Right medial foot (L-4): normal          Right dorsal foot (L-5): normal          Right lateral foot (S-1): normal       Sensory-Monofilament:          Left foot: normal          Right foot: normal       Inspection:          Left foot: normal          Right foot: normal       Nails:          Left foot: normal          Right foot: normal    Impression & Recommendations:  Problem # 1:  DIABETES MELLITUS, TYPE II (ICD-250.00) At goal His updated medication list for this problem  includes:    Avandamet 11-998 Mg Tabs (Rosiglitazone-metformin) .Marland Kitchen... Take one tablet twice daily    Lisinopril 10 Mg Tabs (Lisinopril) .Marland Kitchen... Take one tablet daily  Labs Reviewed: HgBA1c: 5.7 (03/11/2007)   Creat: 1.0 (02/02/2007)      Problem # 2:  HYPERLIPIDEMIA (ICD-272.4) At goal His updated medication list for this problem includes:    Lipitor 10 Mg Tabs (Atorvastatin calcium) .Marland Kitchen... Take one tablet daily  Labs Reviewed: Chol: 138 (03/11/2007)   HDL: 35.8 (03/11/2007)   LDL: 80 (03/11/2007)   TG: 109 (03/11/2007) SGOT: 34 (03/11/2007)   SGPT: 27 (03/11/2007)   Medications Added to Medication List This Visit: 1)  Lisinopril 10 Mg Tabs (Lisinopril) .... Take one tablet daily 2)  Lipitor 10 Mg Tabs (Atorvastatin calcium) .... Take one tablet daily   Patient Instructions: 1)  Please schedule a follow-up appointment in 3 months. 2)  Please return for lab work one (1) week before your next appointment.  (HgbA1c, Lipids, BMET,AST,ALT, microalbumin,) 250.00, 272.4, 401.1    Prescriptions: LISINOPRIL 10 MG  TABS (LISINOPRIL) TAKE ONE TABLET DAILY  #30 x 3   Entered by:   Ardyth Man   Authorized by:   Leanne Chang MD   Signed by:   Leanne Chang MD on 05/06/2007   Method used:   Historical   RxID:   [id number]

## 2010-11-27 NOTE — Assessment & Plan Note (Signed)
Summary: ro4 6 months,cbs   Vital Signs:  Patient Profile:   45 Years Old Male Height:     72 inches Weight:      216.0 pounds Pulse rate:   64 / minute Pulse rhythm:   regular BP sitting:   130 / 96  (left arm) Cuff size:   large  Vitals Entered By: Shary Decamp (November 28, 2008 11:22 AM)                 Chief Complaint:  rov - not fasting; pt out of lipitor x several months -- did not refill because of $$.  History of Present Illness: rov - not fasting cholesterol--pt out of lipitor x several months -- did not refill because of $$. Diet is improving  DM-- back on Avandament (LFTs were elevated w/ Actosplusmet)    Updated Prior Medication List: LISINOPRIL 10 MG  TABS (LISINOPRIL) TAKE ONE TABLET DAILY AVANDAMET 11-998 MG TABS (ROSIGLITAZONE-METFORMIN) two times a day  Current Allergies (reviewed today): No known allergies   Past Medical History:    Reviewed history from 03/13/2008 and no changes required:       Allergic rhinitis       Diabetes mellitus, type II       HYPERLIPIDEMIA (ICD-272.4)       ERECTILE DYSFUNCTION (ICD-302.72)         Past Surgical History:    Reviewed history from 03/13/2008 and no changes required:       no   Family History:    CAD - M (CABG) onset  in her early 4s    DM - M    HTN - M    Stroke - no    colon Ca - no    prostate Ca - F  Social History:    Reviewed history from 03/13/2008 and no changes required:       Divorced       1 child   Risk Factors: Tobacco use:  never Passive smoke exposure:  no Drug use:  no Alcohol use:  no Exercise:  yes    Times per week:  2    Type:  lift weights -- walking   Review of Systems  CV      Denies chest pain or discomfort and swelling of feet.  Resp      Denies shortness of breath.   Physical Exam  General:     alert and well-developed.   Lungs:     normal respiratory effort, no intercostal retractions, no accessory muscle use, and normal breath sounds.     Heart:     normal rate, regular rhythm, and no murmur.   Extremities:     no pretibial edema bilaterally     Impression & Recommendations:  Problem # 1:  HYPERLIPIDEMIA (ICD-272.4) labs lipitor $$, has never try a different med, will re-Rx another statin w/  results ( if appropiate)  The following medications were removed from the medication list:    Lipitor 10 Mg Tabs (Atorvastatin calcium) .Marland Kitchen... Take one tablet daily  Orders: Venipuncture (04540)  Labs Reviewed: Chol: 191 (06/14/2008)   HDL: 33.3 (06/14/2008)   LDL: 140 (06/14/2008)   TG: 87 (06/14/2008) SGOT: 52 (06/14/2008)   SGPT: 65 (06/14/2008)   Problem # 2:  DIABETES MELLITUS, TYPE II (ICD-250.00) labs  Actoplusmet caused increased LFTs  start ASA gi s/e discussed   His updated medication list for this problem includes:    Lisinopril 10 Mg Tabs (  Lisinopril) .Marland Kitchen... Take one tablet daily    Avandamet 11-998 Mg Tabs (Rosiglitazone-metformin) .Marland Kitchen..Marland Kitchen Two times a day    Aspirin Adult Low Strength 81 Mg Tbec (Aspirin) .Marland Kitchen... 1 a day   Problem # 3:  HEALTH SCREENING (ICD-V70.0) declined H1N1, explained benefits   Complete Medication List: 1)  Lisinopril 10 Mg Tabs (Lisinopril) .... Take one tablet daily 2)  Avandamet 11-998 Mg Tabs (Rosiglitazone-metformin) .... Two times a day 3)  Aspirin Adult Low Strength 81 Mg Tbec (Aspirin) .Marland Kitchen.. 1 a day   Patient Instructions: 1)  came back fasting  2)  A1C microalb BMP DX DM 3)  FLP hepatic panel Dx cholesterol 4)  Please schedule a follow-up appointment in 3 months.   Prescriptions: LISINOPRIL 10 MG  TABS (LISINOPRIL) TAKE ONE TABLET DAILY  #30 Each x 6   Entered and Authorized by:   Nolon Rod. Paz MD   Signed by:   Nolon Rod. Paz MD on 11/28/2008   Method used:   Print then Give to Patient   RxID:   [id number] AVANDAMET 11-998 MG TABS (ROSIGLITAZONE-METFORMIN) two times a day  #60 Tablet x 6   Entered and Authorized by:   Nolon Rod. Paz MD   Signed by:   Nolon Rod. Paz MD  on 11/28/2008   Method used:   Print then Give to Patient   RxID:   [id number]

## 2010-11-27 NOTE — Assessment & Plan Note (Signed)
Summary: 3 mos f/u #/cd   Vital Signs:  Patient profile:   45 year old male Height:      72 inches (182.88 cm) Weight:      209 pounds (95.00 kg) BMI:     28.45 O2 Sat:      95 % on Room air Temp:     97.8 degrees F (36.56 degrees C) oral Pulse rate:   87 / minute BP sitting:   120 / 72  (left arm) Cuff size:   large  Vitals Entered By: Brenton Grills CMA Duncan Dull) (September 22, 2010 8:14 AM)  O2 Flow:  Room air CC: 3 month F/U/pt is no longer taking Claritin/aj Is Patient Diabetic? Yes   Referring Provider:  Willow Ora MD Primary Provider:  Nolon Rod. Paz MD  CC:  3 month F/U/pt is no longer taking Claritin/aj.  History of Present Illness: pt states he feels well in general.  he takes 48 units of insulin each am.  no cbg record, but states cbg's are less than 100 during the day, except 180 before breakfast.    Current Medications (verified): 1)  Lisinopril 10 Mg  Tabs (Lisinopril) .... Take One Tablet Daily. Due For Office Visit. 2)  Claritin 10 Mg Tabs (Loratadine) .Marland Kitchen.. 1 By Mouth At Bedtime As Needed Allergies 3)  One Touch Ultra Test Strips .... Checks Bx 3x/day Dx 250.00 4)  Pen Needles 5/16" 31g X 8 Mm Misc (Insulin Pen Needle) .... As Directed 5)  Onetouch Delica Lancets  Misc (Lancets) .... Checks Blood Sugar 3x/day Dx 250.00 6)  Humalog Mix 75/25 Kwikpen 75-25 % Susp (Insulin Lispro Prot & Lispro) .... 45 Units Each Am, and Pen Needles Once Daily 7)  Simvastatin 80 Mg Tabs (Simvastatin) .Marland Kitchen.. 1 Once Daily 8)  Fluticasone Propionate 50 Mcg/act Susp (Fluticasone Propionate) .Marland Kitchen.. 1 Spray Each Nostril Once Daily  Allergies (verified): No Known Drug Allergies  Past History:  Past Medical History: Last updated: 11/20/2009 Allergic rhinitis Diabetes mellitus, type II HYPERLIPIDEMIA  ERECTILE DYSFUNCTION   Review of Systems  The patient denies hypoglycemia.    Physical Exam  General:  normal appearance.   Skin:  injection sites at anterior abdomen are without  lesions.   Additional Exam:   Hemoglobin A1C       [H]  8.1 %      Impression & Recommendations:  Problem # 1:  DIABETES MELLITUS, TYPE II (ICD-250.00) he needs some adjustment in his therapy  Medications Added to Medication List This Visit: 1)  Humalog Mix 75/25 Kwikpen 75-25 % Susp (Insulin lispro prot & lispro) .... 40 units each am, and 5 units with the evening meal, and pen needles two times a day  Other Orders: TLB-A1C / Hgb A1C (Glycohemoglobin) (83036-A1C) Est. Patient Level III (54098)  Patient Instructions: 1)  blood tests are being ordered for you today.  please call [phone] to hear your test results. 2)  pending the test results, please change humalog 75/25, to 40 units each am, and 5 units with the evening meal.   3)  Please schedule a follow-up appointment in 3 months. Prescriptions: FLUTICASONE PROPIONATE 50 MCG/ACT SUSP (FLUTICASONE PROPIONATE) 1 spray each nostril once daily  #1 device x 11   Entered and Authorized by:   Minus Breeding MD   Signed by:   Minus Breeding MD on 09/22/2010   Method used:   Electronically to        [facility] (retail)  [address]       [place], [place]  [address]       Ph: [phone]       Fax: [phone]   RxID:   [id number] LISINOPRIL 10 MG  TABS (LISINOPRIL) TAKE ONE TABLET DAILY. DUE FOR OFFICE VISIT.  #90 Each x 3   Entered and Authorized by:   Minus Breeding MD   Signed by:   Minus Breeding MD on 09/22/2010   Method used:   Electronically to        [facility] (retail)       [address]       [place], [place]  [address]       Ph: [phone]       Fax: [phone]   RxID:   [id number] SIMVASTATIN 80 MG TABS (SIMVASTATIN) 1 once daily  #90 Each x 3   Entered and Authorized by:   Minus Breeding MD   Signed by:   Minus Breeding MD on 09/22/2010   Method used:   Electronically to        [facility] (retail)       [address]       [place], [place]   [address]       Ph: [phone]       Fax: [phone]   RxID:   [id number] HUMALOG MIX 75/25 KWIKPEN 75-25 % SUSP (INSULIN LISPRO PROT & LISPRO) 40 units each am, and 5 units with the evening meal, and pen needles two times a day  #1 box x 11   Entered and Authorized by:   Minus Breeding MD   Signed by:   Minus Breeding MD on 09/22/2010   Method used:   Print then Give to Patient   RxID:   [id number] HUMALOG MIX 75/25 KWIKPEN 75-25 % SUSP (INSULIN LISPRO PROT & LISPRO) 40 units each am, and 5 units with the evening meal, and pen needles two times a day  #1 box x 11   Entered and Authorized by:   Minus Breeding MD   Signed by:   Minus Breeding MD on 09/22/2010   Method used:   Print then Give to Patient   RxID:   [id number]    Orders Added: 1)  TLB-A1C / Hgb A1C (Glycohemoglobin) [83036-A1C] 2)  Est. Patient Level III [82[address]]

## 2010-11-27 NOTE — Letter (Signed)
Summary: Results Follow up Letter   at [facility]  [address] [place], [place] [address]   Phone: [phone]  Fax: [phone]    07/04/2009 MRN: [id number]  Wayne Webb [address] [place], [place]  [address]  Dear Mr. JUARBE,  The following are the results of your recent test(s):  Test         Result    Pap Smear:        Normal _____  Not Normal _____ Comments: ______________________________________________________ Cholesterol: LDL(Bad cholesterol):         Your goal is less than:         HDL (Good cholesterol):       Your goal is more than: Comments:  ______________________________________________________ Mammogram:        Normal _____  Not Normal _____ Comments:  ___________________________________________________________________ Hemoccult:        Normal _____  Not normal _______ Comments:    _____________________________________________________________________ Other Tests:   Attached is a copy of your lab results.  Please schedule a follow up visit with Dr Drue Novel in 2 months.  Call me if you have any questions. Alena Bills [phone] ext [phone]

## 2010-11-27 NOTE — Assessment & Plan Note (Signed)
Summary: new endo consult/type 2 diabetes mellitis/bcbs/#/lb   Vital Signs:  Patient profile:   45 year old male Height:      72 inches (182.88 cm) Weight:      209.13 pounds (95.06 kg) O2 Sat:      96 % on Room air Temp:     97.7 degrees F (36.50 degrees C) oral Pulse rate:   91 / minute BP sitting:   108 / 60  (left arm) Cuff size:   large  Vitals Entered By: Josph Macho RMA (December 30, 2009 10:49 AM)  O2 Flow:  Room air CC: New Endo: Diabetes/ CF Is Patient Diabetic? Yes   Referring Provider:  Willow Ora MD Primary Provider:  Nolon Rod. Paz MD  CC:  New Endo: Diabetes/ CF.  History of Present Illness: pt states 7 years h/o dm.  he denies knowing of any chronic complications.  he has been on insulin x 1 month.  he takes lantus 30 units qd.  no cbg record, but states cbg's are improved to the mid-100's in am. pt says his diet is "good," and exercise is "ok."   symptomatically, pt states 2 years of slight blurry vision, but no associated numbness of the feet.   Current Medications (verified): 1)  Lisinopril 10 Mg  Tabs (Lisinopril) .... Take One Tablet Daily 2)  Janumet 50-1000 Mg Tabs (Sitagliptin-Metformin Hcl) .Marland Kitchen.. 1 By Mouth Two Times A Day 3)  Simvastatin 20 Mg Tabs (Simvastatin) .Marland Kitchen.. 1 By Mouth At Bedtime 4)  Nasonex 50 Mcg/act Susp (Mometasone Furoate) .... 2 Sprays On Each Side Once Daily 5)  Claritin 10 Mg Tabs (Loratadine) .Marland Kitchen.. 1 By Mouth At Bedtime As Needed Allergies 6)  One Touch Ultra Test Strips .... Checks Bx 3x/day Dx 250.00 7)  Lantus Solostar 100 Unit/ml Soln (Insulin Glargine) .... 30 Units At Bedtime 8)  Pen Needles 5/16" 31g X 8 Mm Misc (Insulin Pen Needle) .... As Directed 9)  Onetouch Delica Lancets  Misc (Lancets) .... Checks Blood Sugar 3x/day Dx 250.00  Allergies (verified): No Known Drug Allergies  Past History:  Past Medical History: Last updated: 11/20/2009 Allergic rhinitis Diabetes mellitus, type II HYPERLIPIDEMIA  ERECTILE DYSFUNCTION    Family History: Reviewed history from 11/28/2008 and no changes required. CAD - M (CABG) onset  in her early 71s DM - M HTN - M Stroke - no colon Ca - no prostate Ca - F  Social History: Reviewed history from 11/20/2009 and no changes required. Divorced.  engaged to be remarried 1 child tobacco - quit 1995 works at [employer]. ETOH - no exercise 3x/week diet-- does watch , gain wt during Xmas but now loosing wt   Review of Systems  The patient denies syncope.         denies chest pain, sob, n/v, cramps, excessive diaphoresis, memory loss, hypoglycemia, and easy bruising.  he has lost a few lbs, due to his efforts.  headache and polyuria are much better.  he has mild depression.   Physical Exam  General:  normal appearance.   Head:  head: no deformity eyes: no periorbital swelling, no proptosis external nose and ears are normal mouth: no lesion seen Neck:  there is a diffuse goiter, approx 2x normal size.  no nodule palpable. Lungs:  Clear to auscultation bilaterally. Normal respiratory effort.  Heart:  Regular rate and rhythm without murmurs or gallops noted. Normal S1,S2.   Msk:  muscle bulk and strength are grossly normal.  no  obvious joint swelling.  gait is normal and steady  Pulses:  dorsalis pedis intact bilat.  no carotid bruit  Extremities:  no deformity.  no ulcer on the feet.  feet are of normal color and temp.  no edema  Neurologic:  cn 2-12 grossly intact.   readily moves all 4's.   sensation is intact to touch on the feet  Skin:  normal texture and temp.  no rash.  not diaphoretic  Cervical Nodes:  No significant adenopathy.  Psych:  Alert and cooperative; normal mood and affect; normal attention span and concentration.   Additional Exam:  Hemoglobin A1C       [H]  10.3 %     Impression & Recommendations:  Problem # 1:  DIABETES MELLITUS, TYPE II (ICD-250.00) needs increased rx  Problem # 2:  UNSPECIFIED DISORDER OF THYROID  (ICD-246.9) goiter is suggested by exam  Problem # 3:  blurry vision ? due to #1  Other Orders: Radiology Referral (Radiology) Consultation Level IV [phone])  Patient Instructions: 1)  we discussed the importance of diet and exercise therapy and the risks of diabetes.  you should see an eye doctor every year. 2)  it is very important to keep good control of blood pressure and cholesterol, especially in those with diabetes.  stopping smoking also reduces the damage diabetes does to your body.  please discuss these with your doctor.  you should take an aspirin every day, unless you have been advised by a doctor not to. 3)  check your blood sugar 2 times a day.  vary the time of day when you check, between before the 3 meals, and at bedtime.  also check if you have symptoms of your blood sugar being too high or too low.  please keep a record of the readings and bring it to your next appointment here.  please call us sooner if you are having low blood sugar episodes. 4)  i told pt we will need to take this complex situation in stages 5)  stop janumet. 6)  then, if necesssary, increase the lantus until your blood sugar returns to the low-100's. 7)  check thyroid ultrasound.  you will be called with a day and time for an appointment. 8)  Please schedule a follow-up appointment in 2 weeks.

## 2010-11-27 NOTE — Letter (Signed)
Summary: Results Follow-up Letter  [place] at [facility]  [address] [place], [place] [address]   Phone: [phone]  Fax: [phone]    08/04/2007        Wayne Webb [address] [place], [place]  [address]  Dear [name],   The following are the results of your recent test(s):  Test     Result     Pap Smear    Normal_______  Not Normal_____       Comments: _________________________________________________________ Cholesterol LDL(Bad cholesterol):          Your goal is less than:         HDL (Good cholesterol):        Your goal is more than: _________________________________________________________ Other Tests:   _________________________________________________________  Please call for an appointment Or Please see attached._________________________________________________________ _________________________________________________________ _________________________________________________________  Sincerely,  Ardyth Man Cylinder at [facility]

## 2010-11-27 NOTE — Letter (Signed)
Summary: negative---Diabetic Eye Examination  Diabetic Eye Examination   Imported By: Freddy Jaksch 03/07/2008 10:41:32  _____________________________________________________________________  External Attachment:    Type:   Image     Comment:   External Document

## 2010-11-27 NOTE — Assessment & Plan Note (Signed)
Summary: ro4--3 month ov//ph   Vital Signs:  Patient profile:   45 year old male Height:      72 inches Weight:      210.2 pounds Pulse rate:   84 / minute Pulse rhythm:   regular BP sitting:   144 / 92  (left arm) Cuff size:   large  Vitals Entered By: Shary Decamp (Feb 26, 2009 10:11 AM) Comments rov - fasting Shary Decamp  Feb 26, 2009 10:11 AM    History of Present Illness: ROV feels well  to have his eyes check soon  Current Medications (verified): 1)  Lisinopril 10 Mg  Tabs (Lisinopril) .... Take One Tablet Daily 2)  Avandamet 11-998 Mg Tabs (Rosiglitazone-Metformin) .... Two Times A Day  Allergies (verified): No Known Drug Allergies  Past History:  Past Medical History:    Reviewed history from 03/13/2008 and no changes required:    Allergic rhinitis    Diabetes mellitus, type II    HYPERLIPIDEMIA (ICD-272.4)    ERECTILE DYSFUNCTION (ICD-302.72)  Past Surgical History:    Reviewed history from 03/13/2008 and no changes required:    no  Review of Systems CV:  Denies chest pain or discomfort and swelling of feet. Resp:  Denies cough and shortness of breath. GI:  Denies bloody stools, nausea, and vomiting. Endo:  no symptoms of low CBGs.  Physical Exam  General:  alert and well-developed.   Lungs:  normal respiratory effort, no intercostal retractions, no accessory muscle use, and normal breath sounds.   Heart:  normal rate, regular rhythm, and no murmur.   Extremities:  no pretibial edema bilaterally    Impression & Recommendations:  Problem # 1:  DIABETES MELLITUS, TYPE II (ICD-250.00) well control on avandia, h/o increased LFTs w/  actos we discussed risk of CV events w/avandia and agreed to switch to janumet BP slightly  elevated, on ACEi, see instructions  The following medications were removed from the medication list:    Avandamet 11-998 Mg Tabs (Rosiglitazone-metformin) .Marland Kitchen..Marland Kitchen Two times a day    Aspirin Adult Low Strength 81 Mg Tbec  (Aspirin) .Marland Kitchen... 1 a day His updated medication list for this problem includes:    Lisinopril 10 Mg Tabs (Lisinopril) .Marland Kitchen... Take one tablet daily    Janumet 50-1000 Mg Tabs (Sitagliptin-metformin hcl) .Marland Kitchen... 1 by mouth two times a day  Problem # 2:  HYPERLIPIDEMIA (ICD-272.4) never got our letter regards last FLP so did not start simvastatin. Will re asses FLP on RTC  and consider drugs then (I don't like to change too many meds in one day) The following medications were removed from the medication list:    Simvastatin 20 Mg Tabs (Simvastatin) .Marland Kitchen... 1 by mouth qhs  Complete Medication List: 1)  Lisinopril 10 Mg Tabs (Lisinopril) .... Take one tablet daily 2)  Janumet 50-1000 Mg Tabs (Sitagliptin-metformin hcl) .Marland Kitchen.. 1 by mouth two times a day  Patient Instructions: 1)  Check your blood pressure 2 or 3 times a week. If it is more than 140/85 consistently,please let us know  2)  schedule a PHYSICAL  in 8 to 10 weeks, fasting  Prescriptions: JANUMET 50-1000 MG TABS (SITAGLIPTIN-METFORMIN HCL) 1 by mouth two times a day  #60 x 3   Entered and Authorized by:   Elita Quick E. Paz MD   Signed by:   Nolon Rod. Paz MD on 02/26/2009   Method used:   Print then Give to Patient   RxID:   [id number]

## 2010-11-27 NOTE — Letter (Signed)
Summary: Results Follow-up Letter  [place] at [facility]  [address] [place], [place] [address]   Phone: [phone]  Fax: [phone]    11/22/2007        Wayne Webb [address] [place], [place]  [address]  Dear [name],   The following are the results of your recent test(s):  Test     Result     Pap Smear    Normal_______  Not Normal_____       Comments: _________________________________________________________ Cholesterol LDL(Bad cholesterol):          Your goal is less than:         HDL (Good cholesterol):        Your goal is more than: _________________________________________________________ Other Tests:   _________________________________________________________  Please call for an appointment Or _Please see attached.________________________________________________________ _________________________________________________________ _________________________________________________________  Sincerely,  Ardyth Man [place] Walk at [facility]

## 2010-11-27 NOTE — Progress Notes (Signed)
Summary: rx mail order - dr Blossom Hoops  Phone Note Call from Patient   Caller: Patient Summary of Call: needs rx for mail order avandamet 2/1000mg  - 2 a day   pt called again @ 2:13 [phone] wanting to know if the rx could be faxed to [facility] [phone] is there fax number if your able to do that. tiffany lewis Initial call taken by: Okey Regal [place],  April 12, 2007 2:02 PM  Follow-up for Phone Call        LM for pt. that we can't fax to [facility] unless they send a request for medication refills.  I let the pt. know that he may pick up the rx tomorrow afternoon, But, to please call first. ...................................................................Ardyth Man  April 12, 2007 2:56 PM   Follow-up by: Ardyth Man,  April 12, 2007 2:56 PM

## 2010-11-27 NOTE — Letter (Signed)
Summary: Diabetic Eye Exam/Dr. Marcille Buffy  Diabetic Eye Exam/Dr. Marcille Buffy   Imported By: Lanelle Bal 06/26/2009 13:07:55  _____________________________________________________________________  External Attachment:    Type:   Image     Comment:   External Document

## 2010-11-27 NOTE — Progress Notes (Signed)
Summary: [facility]--rx  Phone Note Refill Request   Refills Requested: Medication #1:  LISINOPRIL 10 MG  TABS TAKE ONE TABLET DAILY [facility] [address]--ph-[phone] fax--[phone]-- last filled--12.8.09  Initial call taken by: Freddy Jaksch,  November 02, 2008 9:01 AM      Prescriptions: LISINOPRIL 10 MG  TABS (LISINOPRIL) TAKE ONE TABLET DAILY  #30 Each x 3   Entered by:   Kandice Hams   Authorized by:   Nolon Rod. Paz MD   Signed by:   Kandice Hams on 11/02/2008   Method used:   Faxed to ...       [facility] [address] (retail)       [address]       [place], [place]  [address]       Ph: [phone]       Fax: [phone]   RxID:   [id number]

## 2010-11-27 NOTE — Progress Notes (Signed)
Summary: FYI BLOOD SUGAR READINGS  Phone Note Call from Patient   Caller: Patient Summary of Call: Pt called with BS readings; 5:22am BS =337, after cereal  BS =351,  took meds around 5:30 this am Yesterday 5:31am BS=524,   10:46 am BS=432,  4:15 pm BS=386, last night 8pm BS=351, Pt took insulin 11pm last night Initial call taken by: Kandice Hams,  December 11, 2009 8:40 AM  Follow-up for Phone Call        increase lantus from 15 to 20u at bedtime, call w/ fasting CBGs on Friday Follow-up by: [facility] E. Paz MD,  December 11, 2009 12:11 PM  Additional Follow-up for Phone Call Additional follow up Details #1::        pt informed of Dr Drue Novel recommendations Kandice Hams  December 11, 2009 12:15 PM  Additional Follow-up by: Kandice Hams,  December 11, 2009 12:15 PM

## 2010-11-27 NOTE — Letter (Signed)
Summary: Handout Printed  Printed Handout:  - *[facility] Patient Instructions 

## 2010-11-27 NOTE — Assessment & Plan Note (Signed)
Summary: 4 MTH FU/NS/KDC   Vital Signs:  Patient profile:   45 year old male Height:      72 inches Weight:      207 pounds BMI:     28.18 Pulse rate:   72 / minute BP sitting:   120 / 80  Vitals Entered By: Shary Decamp (November 20, 2009 1:35 PM) CC: ROV - NOT FASTING Is Patient Diabetic? Yes   History of Present Illness: ROV, feels well  Current Medications (verified): 1)  Lisinopril 10 Mg  Tabs (Lisinopril) .... Take One Tablet Daily 2)  Janumet 50-1000 Mg Tabs (Sitagliptin-Metformin Hcl) .Marland Kitchen.. 1 By Mouth Two Times A Day 3)  Simvastatin 20 Mg Tabs (Simvastatin) .Marland Kitchen.. 1 By Mouth At Bedtime 4)  Nasonex 50 Mcg/act Susp (Mometasone Furoate) .... 2 Sprays On Each Side Once Daily 5)  Claritin 10 Mg Tabs (Loratadine) .Marland Kitchen.. 1 By Mouth At Bedtime As Needed Allergies  Allergies (verified): No Known Drug Allergies  Past History:  Past Medical History: Allergic rhinitis Diabetes mellitus, type II HYPERLIPIDEMIA  ERECTILE DYSFUNCTION   Past Surgical History: Reviewed history from 03/13/2008 and no changes required. no  Social History: Divorced 1 child tobacco - quit 1995 ETOH - no exercise 3x/week diet-- does watch , gain wt during Xmas but now loosing wt   Review of Systems       no recent CBGs he has dry cough in the mornings that gets better after he cough up  mild amount of  sputum.  Symptoms started with the winter two months ago had a single episode of chest pain: Left-sided, sharp, lasting 5 minutes, no associated nausea, diaphoresis.  No radiation, at rest   Physical Exam  General:  alert, well-developed, and well-nourished.   Chest Wall:  no tender to palpation Lungs:  normal respiratory effort, no intercostal retractions, no accessory muscle use, and normal breath sounds.   Heart:  normal rate, regular rhythm, no murmur, and no gallop.   Extremities:  lower extremity: no edema, symmetric   Impression & Recommendations:  Problem # 1:  HYPERLIPIDEMIA  (ICD-272.4) labs  His updated medication list for this problem includes:    Simvastatin 20 Mg Tabs (Simvastatin) .Marland Kitchen... 1 by mouth at bedtime  Labs Reviewed: SGOT: 33 (06/27/2009)   SGPT: 31 (06/27/2009)   HDL:35.60 (06/27/2009), 35.4 (12/05/2008)  LDL:126 (06/27/2009), 123 (12/05/2008)  Chol:182 (06/27/2009), 179 (12/05/2008)  Trig:104.0 (06/27/2009), 101 (12/05/2008)  Problem # 2:  DIABETES MELLITUS, TYPE II (ICD-250.00) labs  He has a mild seasonal cough, if he continue with this ,  consider discontinue lisinopril   His updated medication list for this problem includes:    Lisinopril 10 Mg Tabs (Lisinopril) .Marland Kitchen... Take one tablet daily    Janumet 50-1000 Mg Tabs (Sitagliptin-metformin hcl) .Marland Kitchen... 1 by mouth two times a day  Labs Reviewed: Creat: 1.1 (06/27/2009)     Last Eye Exam: normal (06/04/2009) Reviewed HgBA1c results: 5.6 (06/27/2009)  5.8 (12/05/2008)  Problem # 3:  CHEST PAIN (ICD-786.50) see review of systems, single episode of chest pain EKG today essentially unchanged from previous patient to let  us know if chest pain resurface  Orders: EKG w/ Interpretation (93[address])  Complete Medication List: 1)  Lisinopril 10 Mg Tabs (Lisinopril) .... Take one tablet daily 2)  Janumet 50-1000 Mg Tabs (Sitagliptin-metformin hcl) .Marland Kitchen.. 1 by mouth two times a day 3)  Simvastatin 20 Mg Tabs (Simvastatin) .Marland Kitchen.. 1 by mouth at bedtime 4)  Nasonex 50 Mcg/act Susp (Mometasone furoate) .Marland KitchenMarland KitchenMarland Kitchen  2 sprays on each side once daily 5)  Claritin 10 Mg Tabs (Loratadine) .Marland Kitchen.. 1 by mouth at bedtime as needed allergies   Patient Instructions: 1)  come back fasting: 2)  FLP , AST, ALT----dx  high cholesterol 3)  hemoglobin A1c-----dx  diabetes 4)  Please schedule a follow-up appointment in 4 months .

## 2010-11-27 NOTE — Miscellaneous (Signed)
Summary: eye exam  NEGATIVE for diabetic retinopathy  Clinical Lists Changes  Observations: Added new observation of DMEYEEXAMNXT: 05/2010 (06/24/2009 13:52) Added new observation of DMEYEEXMRES: normal (06/04/2009 13:52) Added new observation of EYE EXAM BY: Marcille Buffy, MD (HP) (06/04/2009 13:52) Added new observation of DIAB EYE EX: normal (06/04/2009 13:52)       Diabetes Management Exam:    Eye Exam:       Eye Exam done elsewhere          Date: 06/04/2009          Results: normal          Done by: Marcille Buffy, MD (HP)

## 2010-11-27 NOTE — Progress Notes (Signed)
Summary: PT NEEDS SOME MEDS UNTILL HE GETS MAIL ORDER  Phone Note Outgoing Call Call back at [facility] Phone [phone]   Caller: Patient Reason for Call: Refill Medication Summary of Call: PT HAS BEEN OUT OF MED SINCE FRIDAY AND NEEDS TO TAKE HIS MED AVANDAMET.PT ALREADY SEND RX IN MAIL ORDER BUT IT WILL TAKE 7-10 DAYS TO GET.CAN SOME MED BE CALLED IN UNTILL HE GETS HIS OVER MAIL. PHARMACY IS [facility] ON [address]. Initial call taken by: Job Founds,  April 18, 2007 9:09 AM Action Taken: Phone Call Completed Summary of Call: Rx called into [facility] in [place] and pt. aware. ...................................................................Ardyth Man  April 18, 2007 10:00 AM

## 2010-11-27 NOTE — Assessment & Plan Note (Signed)
Summary: BS/ double vision/alr   Vital Signs:  Patient profile:   45 year old male Height:      72 inches Weight:      206.6 pounds BMI:     28.12 Pulse rate:   74 / minute BP sitting:   110 / 70  Vitals Entered By: Shary Decamp (December 24, 2009 9:03 AM) CC: blurred vision, getting worse since blood sugar has improved, fasting blood sugar this am was 221 Is Patient Diabetic? Yes   History of Present Illness: here at my request besides blurred vision he is also having diplopia for one week diplopia is steady  Current Medications (verified): 1)  Lisinopril 10 Mg  Tabs (Lisinopril) .... Take One Tablet Daily 2)  Janumet 50-1000 Mg Tabs (Sitagliptin-Metformin Hcl) .Marland Kitchen.. 1 By Mouth Two Times A Day 3)  Simvastatin 20 Mg Tabs (Simvastatin) .Marland Kitchen.. 1 By Mouth At Bedtime 4)  Nasonex 50 Mcg/act Susp (Mometasone Furoate) .... 2 Sprays On Each Side Once Daily 5)  Claritin 10 Mg Tabs (Loratadine) .Marland Kitchen.. 1 By Mouth At Bedtime As Needed Allergies 6)  One Touch Ultra Test Strips .... Checks Bx 3x/day Dx 250.00 7)  Lantus Solostar 100 Unit/ml Soln (Insulin Glargine) .... 30 Units At Bedtime 8)  Pen Needles 5/16" 31g X 8 Mm Misc (Insulin Pen Needle) .... As Directed 9)  Onetouch Delica Lancets  Misc (Lancets) .... Checks Blood Sugar 3x/day Dx 250.00  Allergies (verified): No Known Drug Allergies  Past History:  Past Medical History: Reviewed history from 11/20/2009 and no changes required. Allergic rhinitis Diabetes mellitus, type II HYPERLIPIDEMIA  ERECTILE DYSFUNCTION   Past Surgical History: Reviewed history from 03/13/2008 and no changes required. no  Social History: Reviewed history from 11/20/2009 and no changes required. Divorced 1 child tobacco - quit 1995 ETOH - no exercise 3x/week diet-- does watch , gain wt during Xmas but now loosing wt   Review of Systems       admits to mild headache, mostly in the left temple, denies stiff neck mild dizziness on and off during the  day, mostly when he stands up.  At the time of the dizziness, his CBGs has been in the 150s to 180s  Physical Exam  General:  alert and well-developed.  alert and well-developed.   Ears:  EOMI pupils equal and reactive ant. chambers normal  Neurologic:  speech normal motor, and gait are within normal as well face symmetric   Impression & Recommendations:  Problem # 1:  DIPLOPIA (ICD-368.2) patient complaining of blurred vision and diplopia in the sitting of uncontrolled diabetes. Currently his sugars are going down as we titrate up his insulin unclear to me why he has diplopia, his neurological exam is essentially normal. Will refer to ophthalmology,  if they  recommend so, will proceed with a neurological workup (MRI?)  Orders: Ophthalmology Referral (Ophthalmology)  Problem # 2:  DIABETES MELLITUS, TYPE II (ICD-250.00) currently on 30 u lantus , CBGs varies from 124 to 200 patient  quite concerned about his sugars and the fact that his previously well controlled DM is now requiring insulin  will refer to endocrinology   His updated medication list for this problem includes:    Lisinopril 10 Mg Tabs (Lisinopril) .Marland Kitchen... Take one tablet daily    Janumet 50-1000 Mg Tabs (Sitagliptin-metformin hcl) .Marland Kitchen... 1 by mouth two times a day    Lantus Solostar 100 Unit/ml Soln (Insulin glargine) .Marland KitchenMarland KitchenMarland KitchenMarland Kitchen 30 units at bedtime  His updated medication list for  this problem includes:    Lisinopril 10 Mg Tabs (Lisinopril) .Marland Kitchen... Take one tablet daily    Janumet 50-1000 Mg Tabs (Sitagliptin-metformin hcl) .Marland Kitchen... 1 by mouth two times a day    Lantus Solostar 100 Unit/ml Soln (Insulin glargine) .Marland KitchenMarland KitchenMarland KitchenMarland Kitchen 30 units at bedtime  Orders: Endocrinology Referral (Endocrine)  Complete Medication List: 1)  Lisinopril 10 Mg Tabs (Lisinopril) .... Take one tablet daily 2)  Janumet 50-1000 Mg Tabs (Sitagliptin-metformin hcl) .Marland Kitchen.. 1 by mouth two times a day 3)  Simvastatin 20 Mg Tabs (Simvastatin) .Marland Kitchen.. 1 by mouth at  bedtime 4)  Nasonex 50 Mcg/act Susp (Mometasone furoate) .... 2 sprays on each side once daily 5)  Claritin 10 Mg Tabs (Loratadine) .Marland Kitchen.. 1 by mouth at bedtime as needed allergies 6)  One Touch Ultra Test Strips  .... Checks bx 3x/day dx 250.00 7)  Lantus Solostar 100 Unit/ml Soln (Insulin glargine) .... 30 units at bedtime 8)  Pen Needles 5/16" 31g X 8 Mm Misc (Insulin pen needle) .... As directed 9)  Onetouch Delica Lancets Misc (Lancets) .... Checks blood sugar 3x/day dx 250.00  Patient Instructions: 1)  continue w/ lantus 30u a day

## 2010-11-27 NOTE — Assessment & Plan Note (Signed)
Summary: CPX--DISCUSS LABS///[facility]   Vital Signs:  Patient profile:   45 year old male Height:      72 inches Weight:      210.2 pounds BMI:     28.61 Pulse rate:   80 / minute Pulse rhythm:   regular BP sitting:   130 / 82  (left arm) Cuff size:   large  Vitals Entered By: Shary Decamp (July 12, 2009 9:50 AM) CC: cpx Comments  - c/o of PN drip @ night -- "constantly trying to clear throat in the morning" Shary Decamp  July 12, 2009 9:53 AM    History of Present Illness: CPX ---c/o of PN drip @ night  x a while -- "constantly trying to clear throat in the morning" ---high chol, likes to discuss labs ---needs to dicuss DM labs   Current Medications (verified): 1)  Lisinopril 10 Mg  Tabs (Lisinopril) .... Take One Tablet Daily 2)  Janumet 50-1000 Mg Tabs (Sitagliptin-Metformin Hcl) .Marland Kitchen.. 1 By Mouth Two Times A Day 3)  Simvastatin 20 Mg Tabs (Simvastatin) .Marland Kitchen.. 1 By Mouth At Bedtime  Allergies (verified): No Known Drug Allergies  Past History:  Past Medical History: Allergic rhinitis Diabetes mellitus, type II HYPERLIPIDEMIA  ERECTILE DYSFUNCTION   Past Surgical History: Reviewed history from 03/13/2008 and no changes required. no  Family History: Reviewed history from 11/28/2008 and no changes required. CAD - M (CABG) onset  in her early 30s DM - M HTN - M Stroke - no colon Ca - no prostate Ca - F  Social History: Divorced 1 child tobacco - quit 1995 ETOH - no exercise 3x/week diet-- does watch   Review of Systems General:  Denies fatigue, fever, and weakness. CV:  Denies chest pain or discomfort, palpitations, and swelling of feet. Resp:  Denies wheezing; occasionally cough thinks from PN drip . GI:  Denies bloody stools, diarrhea, nausea, and vomiting. GU:  Denies dysuria, hematuria, and urinary hesitancy. Psych:  Denies anxiety and depression. Allergy:  no itchy eyes no itchy nose sneezing occasionally .  Physical Exam  General:   alert, well-developed, and well-nourished.   Neck:  no masses and no thyromegaly.   Lungs:  normal respiratory effort, no intercostal retractions, no accessory muscle use, and normal breath sounds.   Heart:  normal rate, regular rhythm, no murmur, and no gallop.   Abdomen:  soft, non-tender, no distention, no masses, no guarding, and no rigidity.   Rectal:  No external abnormalities noted. Normal sphincter tone. No rectal masses or tenderness. Prostate:  Prostate gland firm and smooth, no enlargement, nodularity, tenderness, mass, asymmetry or induration. Pulses:  normal pedal pulses bilaterally  Extremities:  no pretibial edema bilaterally   Diabetes Management Exam:    Foot Exam (with socks and/or shoes not present):       Sensory-Pinprick/Light touch:          Left medial foot (L-4): normal          Left dorsal foot (L-5): normal          Left lateral foot (S-1): normal          Right medial foot (L-4): normal          Right dorsal foot (L-5): normal          Right lateral foot (S-1): normal       Sensory-Monofilament:          Left foot: normal          Right foot:  normal       Inspection:          Left foot: normal          Right foot: normal       Nails:          Left foot: normal          Right foot: normal    Impression & Recommendations:  Problem # 1:  HEALTH SCREENING (ICD-V70.0) Td 2006 pneumonia shot 2007 rec to continue  healthy life style  labs reviewed   Problem # 2:  DIABETES MELLITUS, TYPE II (ICD-250.00) was switch to avandia to current meds 02-2009, doing great eye check (-) 3 weeks ago  per patient   His updated medication list for this problem includes:    Lisinopril 10 Mg Tabs (Lisinopril) .Marland Kitchen... Take one tablet daily    Janumet 50-1000 Mg Tabs (Sitagliptin-metformin hcl) .Marland Kitchen... 1 by mouth two times a day  Labs Reviewed: Creat: 1.1 (06/27/2009)     Last Eye Exam: normal (06/04/2009) Reviewed HgBA1c results: 5.6 (06/27/2009)  5.8  (12/05/2008)  Problem # 3:  HYPERLIPIDEMIA (ICD-272.4) not at goal, was rec to start simva His updated medication list for this problem includes:    Simvastatin 20 Mg Tabs (Simvastatin) .Marland Kitchen... 1 by mouth at bedtime  Labs Reviewed: SGOT: 33 (06/27/2009)   SGPT: 31 (06/27/2009)   HDL:35.60 (06/27/2009), 35.4 (12/05/2008)  LDL:126 (06/27/2009), 123 (12/05/2008)  Chol:182 (06/27/2009), 179 (12/05/2008)  Trig:104.0 (06/27/2009), 101 (12/05/2008)  Problem # 4:  ALLERGIC RHINITIS (ICD-477.9)  not well controlled see HPI Rx nasonex and otc claritin  His updated medication list for this problem includes:    Nasonex 50 Mcg/act Susp (Mometasone furoate) .Marland Kitchen... 2 sprays on each side once daily    Claritin 10 Mg Tabs (Loratadine) .Marland Kitchen... 1 by mouth at bedtime as needed allergies  Complete Medication List: 1)  Lisinopril 10 Mg Tabs (Lisinopril) .... Take one tablet daily 2)  Janumet 50-1000 Mg Tabs (Sitagliptin-metformin hcl) .Marland Kitchen.. 1 by mouth two times a day 3)  Simvastatin 20 Mg Tabs (Simvastatin) .Marland Kitchen.. 1 by mouth at bedtime 4)  Nasonex 50 Mcg/act Susp (Mometasone furoate) .... 2 sprays on each side once daily 5)  Claritin 10 Mg Tabs (Loratadine) .Marland Kitchen.. 1 by mouth at bedtime as needed allergies  Patient Instructions: 1)  call any time if you have symptoms of low blood sugar 2)  call if your sugars are consistently below 75 3)  Please schedule a follow-up appointment in 4 months .  Prescriptions: NASONEX 50 MCG/ACT SUSP (MOMETASONE FUROATE) 2 sprays on each side once daily  #1 x 12   Entered and Authorized by:   Jose E. Paz MD   Signed by:   Nolon Rod. Paz MD on 07/12/2009   Method used:   Print then Give to Patient   RxID:   [id number] LISINOPRIL 10 MG  TABS (LISINOPRIL) TAKE ONE TABLET DAILY  #30 Tablet x 5   Entered by:   Shary Decamp   Authorized by:   Nolon Rod. Paz MD   Signed by:   Shary Decamp on 07/12/2009   Method used:   Electronically to        [facility] (retail)        [address]       [place], [place]  [address]       Ph: [phone]       Fax: [phone]   RxID:   [id number] JANUMET 50-1000 MG TABS (SITAGLIPTIN-METFORMIN  HCL) 1 by mouth two times a day  #60 x 5   Entered by:   Shary Decamp   Authorized by:   Nolon Rod. Paz MD   Signed by:   Shary Decamp on 07/12/2009   Method used:   Electronically to        [facility] (retail)       [address]       [place], [place]  [address]       Ph: [phone]       Fax: [phone]   RxID:   [id number]    Family History:    Reviewed history from 11/28/2008 and no changes required:       CAD - M (CABG) onset  in her early 18s       DM - M       HTN - M       Stroke - no       colon Ca - no       prostate Ca - F  Social History:    Divorced    1 child    tobacco - quit 1995    ETOH - no    exercise 3x/week    diet-- does watch      Preventive Care Screening  Prior Values:    PSA:  0.91 (06/27/2009)    Last Tetanus Booster:  Td (10/26/2004)    Last Flu Shot:  Fluvax Non-MCR (08/19/2007)    Last Pneumovax:  Pneumovax (10/26/2005)

## 2010-11-27 NOTE — Progress Notes (Signed)
Summary: FYI  ref CBG /Dr paz see please  Phone Note Call from Patient   Caller: Patient Summary of Call: pt called this am CBG this am fasting=264    Pt confirmed taking 25 units Lantus -Sun am fasting  BS= 318, BS ran 350-400 all day at 10pm BS=444,had HA and felt groggy per patient -Sat am fasting BS=306,   12:30 pm BS=355, 4:30pm BS=396      .Kandice Hams  December 16, 2009 12:59 PM   Initial call taken by: Kandice Hams,  December 16, 2009 12:59 PM  Follow-up for Phone Call        increase lantus to 32 u at bedtime , call in 3 days will call in AM (called a minute ago ----> no answer) Jose E. Paz MD  December 16, 2009 5:26 PM   Spoke with pt this am--- FYI  fasting BS this am =150 7pm last night =197 11pm last night= 190 Pt says has been watching diet, eating steamed foods,.  -Still increase lantus? Kandice Hams  December 17, 2009 9:23 AM      Additional Follow-up for Phone Call Additional follow up Details #1::        increase from 25 to 30u (not 32) call in 3 days w/  CBGs Additional Follow-up by: [facility] E. Paz MD,  December 17, 2009 12:49 PM    Additional Follow-up for Phone Call Additional follow up Details #2::    pt informed of Dr Drue Novel recommendations .Kandice Hams  December 17, 2009 2:06 PM  Follow-up by: Kandice Hams,  December 17, 2009 2:06 PM

## 2010-11-27 NOTE — Letter (Signed)
Summary: Handout Printed  Printed Handout:  - *Patient Instructions 

## 2010-11-27 NOTE — Assessment & Plan Note (Signed)
Summary: cpx,lab,diabetes check.cbs   Vital Signs:  Patient Profile:   45 Years Old Male Weight:      208.6 pounds Pulse rate:   52 / minute BP sitting:   128 / 80  Vitals Entered By: Shary Decamp (Mar 13, 2008 12:58 PM)             Is Patient Diabetic? Yes  Comments blood sugar at home -- 7 day average 117                                     14 & 30 day average 130 would like sample of veramyst .................Marland KitchenShary Decamp  Mar 13, 2008 1:04 PM       Chief Complaint:  cpx .  History of Present Illness: CPX     Updated Prior Medication List: AVANDAMET 11-998 MG  TABS (ROSIGLITAZONE-METFORMIN) TAKE ONE TABLET TWICE DAILY LISINOPRIL 10 MG  TABS (LISINOPRIL) TAKE ONE TABLET DAILY LIPITOR 10 MG  TABS (ATORVASTATIN CALCIUM) TAKE ONE TABLET DAILY VERAMYST 27.5 MCG/SPRAY  SUSP (FLUTICASONE FUROATE) as directed  Current Allergies (reviewed today): No known allergies   Past Medical History:    Reviewed history from 03/15/2007 and no changes required:       Allergic rhinitis       Diabetes mellitus, type II       HYPERLIPIDEMIA (ICD-272.4)       ERECTILE DYSFUNCTION (ICD-302.72)         Past Surgical History:    no   Family History:    Reviewed history and no changes required:       CAD - M (CABG) onset DAC in her early 25s       DM - M       HTN - M       Stroke - no       colon Ca - no       prostate Ca - F  Social History:    Reviewed history and no changes required:       Divorced       1 child   Risk Factors:  Tobacco use:  never Passive smoke exposure:  no Drug use:  no Alcohol use:  no Exercise:  yes    Times per week:  2    Type:  lift weights -- walking   Review of Systems  ENT      veramyst  helps w/ phlegm and  w/  PNdrip  CV      Denies chest pain or discomfort and swelling of feet.  Resp      Denies cough and shortness of breath.  GI      Denies abdominal pain and bloody stools.  GU      Denies dysuria and urinary  hesitancy.  Endo      CBGs sometimes in the 90s w/  a feeling that he "needs to eat" CBG 60 x 1    Physical Exam  General:     alert, well-developed, and well-nourished.   Lungs:     normal respiratory effort, no intercostal retractions, no accessory muscle use, and normal breath sounds.   Heart:     normal rate, regular rhythm, and no murmur.   Abdomen:     soft, non-tender, no hepatomegaly, and no splenomegaly.   Pulses:     normal B pedal Extremities:     NO EDEMA  Diabetes Management Exam:    Foot Exam (with socks and/or shoes not present):       Sensory-Pinprick/Light touch:          Left medial foot (L-4): normal          Left dorsal foot (L-5): normal          Left lateral foot (S-1): normal          Right medial foot (L-4): normal          Right dorsal foot (L-5): normal          Right lateral foot (S-1): normal       Sensory-Monofilament:          Left foot: normal          Right foot: normal       Inspection:          Left foot: normal          Right foot: normal       Nails:          Left foot: normal          Right foot: normal    Impression & Recommendations:  Problem # 1:  HEALTH SCREENING (ICD-V70.0) labs reviewed w/ pt UTD on shots EKG NSR Orders: EKG w/ Interpretation (93000)   Problem # 2:  DIABETES MELLITUS, TYPE II (ICD-250.00) DM well control occ low CBGs my personal preference is Actos over Avandia, pt willing to try switch to actosplusmet 15/500 (less metformin b/c is having some low CBGs) His updated medication list for this problem includes:    Actoplus Met 15-500 Mg Tabs (Pioglitazone hcl-metformin hcl) .Marland Kitchen... 1 by mouth two times a day    Lisinopril 10 Mg Tabs (Lisinopril) .Marland Kitchen... Take one tablet daily  Labs Reviewed: HgBA1c: 5.5 (11/21/2007)   Creat: 1.1 (02/28/2008)      Problem # 3:  HYPERLIPIDEMIA (ICD-272.4) not optimal control per last FLP reasses after switching to Actos His updated medication list for this problem  includes:    Lipitor 10 Mg Tabs (Atorvastatin calcium) .Marland Kitchen... Take one tablet daily  Labs Reviewed: Chol: 155 (02/28/2008)   HDL: 30.8 (02/28/2008)   LDL: 109 (02/28/2008)   TG: 75 (02/28/2008) SGOT: 41 (02/28/2008)   SGPT: 43 (02/28/2008)   Complete Medication List: 1)  Actoplus Met 15-500 Mg Tabs (Pioglitazone hcl-metformin hcl) .Marland Kitchen.. 1 by mouth two times a day 2)  Lisinopril 10 Mg Tabs (Lisinopril) .... Take one tablet daily 3)  Lipitor 10 Mg Tabs (Atorvastatin calcium) .... Take one tablet daily 4)  Veramyst 27.5 Mcg/spray Susp (Fluticasone furoate) .... As directed   Patient Instructions: 1)  Please schedule a follow-up appointment in 3 months.   Prescriptions: LIPITOR 10 MG  TABS (ATORVASTATIN CALCIUM) TAKE ONE TABLET DAILY  #30 Each x 3   Entered by:   Shary Decamp   Authorized by:   Nolon Rod. Paz MD   Signed by:   Shary Decamp on 03/13/2008   Method used:   Print then Give to Patient   RxID:   [id number] LISINOPRIL 10 MG  TABS (LISINOPRIL) TAKE ONE TABLET DAILY  #30 Each x 3   Entered by:   Shary Decamp   Authorized by:   Nolon Rod. Paz MD   Signed by:   Shary Decamp on 03/13/2008   Method used:   Print then Give to Patient   RxID:   [id number] VERAMYST 27.5 MCG/SPRAY  SUSP (FLUTICASONE FUROATE) as directed  #3  x 1   Entered and Authorized by:   Nolon Rod. Paz MD   Signed by:   Nolon Rod. Paz MD on 03/13/2008   Method used:   Print then Give to Patient   RxID:   [id number] ACTOPLUS MET 15-500 MG  TABS (PIOGLITAZONE HCL-METFORMIN HCL) 1 by mouth two times a day  #180 x 1   Entered and Authorized by:   Elita Quick E. Paz MD   Signed by:   Nolon Rod. Paz MD on 03/13/2008   Method used:   Print then Give to Patient   RxID:   [id number]  ]

## 2010-11-27 NOTE — Assessment & Plan Note (Signed)
Summary: roa 3 months,cbs   Vital Signs:  Patient Profile:   45 Years Old Male Weight:      215.38 pounds Temp:     98.2 degrees F oral Pulse rate:   72 / minute Resp:     14 per minute BP sitting:   120 / 90  (right arm)  Pt. in pain?   no  Vitals Entered By: Ardyth Man (August 19, 2007 8:21 AM)                  Chief Complaint:  3 month follow up for Diabetes and Type 2 diabetes mellitus follow-up.  History of Present Illness:  Type 2 Diabetes Mellitus Follow-Up      This is a 45 year old man who presents for Type 2 diabetes mellitus follow-up.  The patient denies self managed hypoglycemia and hypoglycemia requiring help.  The patient denies the following symptoms: chest pain.  Since the last visit the patient reports good dietary compliance and monitoring blood glucose.  The patient has been measuring capillary blood glucose before breakfast.  Since the last visit, the patient reports having had eye care by an ophthalmologist.     Hyperlipidemia Follow-Up      The patient also presents for Hyperlipidemia follow-up.  Compliance with medications (by patient report) has been intermittent.  Dietary compliance has been good.   Reports he missed 1.5  months of lipitor because he didn't get it from mail order. Hypertension Follow-Up      The patient also presents for Hypertension follow-up.  Compliance with medications (by patient report) has been near 100%.    Reports increase congestion with PND with change in weather. No other symptoms.Patient has allergies        Physical Exam  General:     Well-developed,well-nourished,in no acute distress; alert,appropriate and cooperative throughout examination Chest Wall:     No deformities, masses, tenderness or gynecomastia noted. Lungs:     Normal respiratory effort, chest expands symmetrically. Lungs are clear to auscultation, no crackles or wheezes. Heart:     Normal rate and regular rhythm. S1 and S2 normal without  gallop, murmur, click, rub or other extra sounds.  Diabetes Management Exam:    Foot Exam (with socks and/or shoes not present):       Sensory-Pinprick/Light touch:          Left medial foot (L-4): normal          Left dorsal foot (L-5): normal          Left lateral foot (S-1): normal          Right medial foot (L-4): normal          Right dorsal foot (L-5): normal          Right lateral foot (S-1): normal       Sensory-Monofilament:          Left foot: normal          Right foot: normal       Inspection:          Left foot: normal          Right foot: normal       Nails:          Left foot: normal          Right foot: normal    Impression & Recommendations:  Problem # 1:  DIABETES MELLITUS, TYPE II (ICD-250.00) At goal  His updated medication list for  this problem includes:    Avandamet 11-998 Mg Tabs (Rosiglitazone-metformin) .Marland Kitchen... Take one tablet twice daily    Lisinopril 10 Mg Tabs (Lisinopril) .Marland Kitchen... Take one tablet daily  Labs Reviewed: HgBA1c: 5.5 (08/03/2007)   Creat: 1.1 (08/03/2007)      Problem # 2:  HYPERLIPIDEMIA (ICD-272.4) No changes. Patient back on track with lipitor Recheck at next visit His updated medication list for this problem includes:    Lipitor 10 Mg Tabs (Atorvastatin calcium) .Marland Kitchen... Take one tablet daily  Labs Reviewed: Chol: 185 (08/03/2007)   HDL: 35.8 (08/03/2007)   LDL: 130 (08/03/2007)   TG: 98 (08/03/2007) SGOT: 36 (08/03/2007)   SGPT: 35 (08/03/2007)   Problem # 3:  ALLERGIC RHINITIS (ICD-477.9) Recommended Veramyst F/u if no improvement The following medications were removed from the medication list:    Clarinex 5 Mg Tabs (Desloratadine)  His updated medication list for this problem includes:    Veramyst 27.5 Mcg/spray Susp (Fluticasone furoate) .Marland Kitchen... As directed   Complete Medication List: 1)  Avandamet 11-998 Mg Tabs (Rosiglitazone-metformin) .... Take one tablet twice daily 2)  Ginseng Complex/royal Jelly 800-200 Mg Caps  (Ginsengs-royal jelly) 3)  Multivitamin/iron Chew (Ped multiple vitamins w/ minerals chew) 4)  Lisinopril 10 Mg Tabs (Lisinopril) .... Take one tablet daily 5)  Lipitor 10 Mg Tabs (Atorvastatin calcium) .... Take one tablet daily 6)  Veramyst 27.5 Mcg/spray Susp (Fluticasone furoate) .... As directed  Other Orders: Influenza Vaccine NON MCR ([address])   Patient Instructions: 1)  Please schedule a follow-up appointment in 3 months. 2)  Please return for lab work one (1) week before your next appointment.  (HgbA1c, Lipids, BMET,AST,ALT,) 250.00, 272.4, 401.1    ]  Influenza Vaccine    Vaccine Type: Fluvax Non-MCR    Site: right deltoid    Mfr: Sanofi Pasteur    Dose: 0.5 ml    Route: IM    Given by: Ardyth Man    Exp. Date: 04/24/2008    Lot #: [id number]    VIS given: 04/24/05 version given August 19, 2007.

## 2010-11-27 NOTE — Assessment & Plan Note (Signed)
Summary: 2 WK FU  STC   Vital Signs:  Patient profile:   45 year old male Height:      72 inches (182.88 cm) Weight:      210.38 pounds (95.63 kg) O2 Sat:      95 % on Room air Temp:     97.2 degrees F (36.22 degrees C) oral Pulse rate:   75 / minute BP sitting:   130 / 80  (left arm) Cuff size:   large  Vitals Entered By: Josph Macho RMA (January 13, 2010 1:13 PM)  O2 Flow:  Room air CC: 2 week follow up/ CF Is Patient Diabetic? Yes   Referring Provider:  Willow Ora MD Primary Provider:  Nolon Rod. Paz MD  CC:  2 week follow up/ CF.  History of Present Illness: pt takes lantus 45 units once daily.  he brings a record of his cbg's which i have reviewed today.  it varies from 74-200's.  it is in general higher as the day goes on.  he says he is willing to take insulin two times a day.   he is unable to say why his dm deteriorated over the past 6 mos or so.  specifically, he denies, weight change, steriods, or pancreatitis.  Current Medications (verified): 1)  Lisinopril 10 Mg  Tabs (Lisinopril) .... Take One Tablet Daily 2)  Simvastatin 20 Mg Tabs (Simvastatin) .Marland Kitchen.. 1 By Mouth At Bedtime 3)  Nasonex 50 Mcg/act Susp (Mometasone Furoate) .... 2 Sprays On Each Side Once Daily 4)  Claritin 10 Mg Tabs (Loratadine) .Marland Kitchen.. 1 By Mouth At Bedtime As Needed Allergies 5)  One Touch Ultra Test Strips .... Checks Bx 3x/day Dx 250.00 6)  Lantus Solostar 100 Unit/ml Soln (Insulin Glargine) .... 45 Units At Bedtime 7)  Pen Needles 5/16" 31g X 8 Mm Misc (Insulin Pen Needle) .... As Directed 8)  Onetouch Delica Lancets  Misc (Lancets) .... Checks Blood Sugar 3x/day Dx 250.00  Allergies (verified): No Known Drug Allergies  Past History:  Past Medical History: Last updated: 11/20/2009 Allergic rhinitis Diabetes mellitus, type II HYPERLIPIDEMIA  ERECTILE DYSFUNCTION   Family History: Reviewed history from 11/28/2008 and no changes required. CAD - M (CABG) onset  in her early 51s DM - M  (deceased, had dm from her 8's) HTN - M Stroke - no colon Ca - no prostate Ca - F  Review of Systems  The patient denies hypoglycemia.    Physical Exam  General:  normal appearance.   Skin:  insulin injection sites at anterior abdomen are normal    Impression & Recommendations:  Problem # 1:  DIABETES MELLITUS, TYPE II (ICD-250.00) reason for deterioration is unclear.    Medications Added to Medication List This Visit: 1)  Humalog Mix 75/25 Kwikpen 75-25 % Susp (Insulin lispro prot & lispro) .... 30 units each am, and pen needles once daily  Other Orders: Est. Patient Level III ([address])  Patient Instructions: 1)  check your blood sugar 2 times a day.  vary the time of day when you check, between before the 3 meals, and at bedtime.  also check if you have symptoms of your blood sugar being too high or too low.  please keep a record of the readings and bring it to your next appointment here.  please call us sooner if you are having low blood sugar episodes. 2)  ok to use-up your current insulin, but you should reduce to 40 units once daily. 3)  when  you finish, change lantus to humalog 75/25, 30 units each am.  if necessary, then increase by 5, every few day, until blood sugar at some trime of day is in the low-100's. 4)  Please have a follow-up appointment 2 weeks later. Prescriptions: HUMALOG MIX 75/25 KWIKPEN 75-25 % SUSP (INSULIN LISPRO PROT & LISPRO) 30 units each am, and pen needles once daily  #1 box x 11   Entered and Authorized by:   Minus Breeding MD   Signed by:   Minus Breeding MD on 01/13/2010   Method used:   Print then Give to Patient   RxID:   [id number]

## 2010-11-27 NOTE — Progress Notes (Signed)
  Phone Note Outgoing Call Call back at [facility] Phone [phone]   Call placed by: Ardyth Man,  August 04, 2007 12:40 PM Call placed to: Patient Summary of Call: [facility] and mailed lab letter ...................................................................Ardyth Man  August 04, 2007 12:41 PM  Initial call taken by: Ardyth Man,  August 04, 2007 12:41 PM

## 2010-11-27 NOTE — Letter (Signed)
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  Please take this into consideration when reviewing the time away from work/school.    Special Instructions:  [  ] None  []  To be off the remainder of today, returning to the normal work / school schedule tomorrow.  [  ] To be off until the next scheduled appointment on ______________________.  [  ] Other    Patient needs to be off 2/14 & 2/15   Sincerely yours,   Shary Decamp

## 2010-11-27 NOTE — Assessment & Plan Note (Signed)
Summary: cpx///sph   Vital Signs:  Patient profile:   45 year old male Weight:      211 pounds Pulse rate:   82 / minute Pulse rhythm:   regular BP sitting:   128 / 84  (left arm) Cuff size:   large  Vitals Entered By: Army Fossa CMA (November 12, 2010 1:55 PM) CC: CPX, not fasting  Comments no complaints  [facility] [address]  [address]    History of Present Illness: CPX few weeks history of pain at the left wrist, dorsal aspect, worse with moving his fingers. No redness or swelling  Current Medications (verified): 1)  Lisinopril 10 Mg  Tabs (Lisinopril) .... Take One Tablet Daily. Due For Office Visit. 2)  One Touch Ultra Test Strips .... Checks Bx 3x/day Dx 250.00 3)  Pen Needles 5/16" 31g X 8 Mm Misc (Insulin Pen Needle) .... As Directed 4)  Onetouch Delica Lancets  Misc (Lancets) .... Checks Blood Sugar 3x/day Dx 250.00 5)  Humalog Mix 75/25 Kwikpen 75-25 % Susp (Insulin Lispro Prot & Lispro) .... 40 Units Each Am, and 5 Units With The Evening Meal, and Pen Needles Two Times A Day 6)  Simvastatin 80 Mg Tabs (Simvastatin) .Marland Kitchen.. 1 Once Daily 7)  Fluticasone Propionate 50 Mcg/act Susp (Fluticasone Propionate) .Marland Kitchen.. 1 Spray Each Nostril Once Daily 8)  Cinnamon Pills  Allergies (verified): No Known Drug Allergies  Past History:  Past Medical History: Reviewed history from 11/20/2009 and no changes required. Allergic rhinitis Diabetes mellitus, type II HYPERLIPIDEMIA  ERECTILE DYSFUNCTION   Past Surgical History: Reviewed history from 03/13/2008 and no changes required. no  Family History: Reviewed history from 01/13/2010 and no changes required. CAD - M (CABG) onset  in her early 4s DM - M (deceased, had dm from her 25's) HTN - M Stroke - no colon Ca - no prostate Ca - F  Social History: Divorced.  engaged to be remarried 1 child tobacco - quit 1995 works at [employer]. ETOH -- rarely  exercise   ~ 1 x/week diet-- does watch   Review of  Systems CV:  Denies chest pain or discomfort and swelling of feet. Resp:  Denies shortness of breath; cough in AM, once he has his nasal spray he feels well. GI:  Denies bloody stools, diarrhea, nausea, and vomiting. GU:  Denies dysuria, hematuria, urinary frequency, and urinary hesitancy.  Physical Exam  General:  alert, well-developed, and well-nourished.   Neck:  no masses and no thyromegaly.   Lungs:  normal respiratory effort, no intercostal retractions, no accessory muscle use, and normal breath sounds.   Heart:  normal rate, regular rhythm, no murmur, and no gallop.   Abdomen:  soft, non-tender, no distention, no masses, no guarding, and no rigidity.   Rectal:  No external abnormalities noted. Normal sphincter tone. No rectal masses or tenderness. brown stools, Hemoccult negative Prostate:  Prostate gland firm and smooth, no enlargement, nodularity, tenderness, mass, asymmetry or induration. Extremities:  no lower extremity edema R wrist normal L wrist normal, slightly  tender and puffy at the proximal-dorsal aspect of the hand. no redness   Psych:  Cognition and judgment appear intact. Alert and cooperative with normal attention span and concentration.  not anxious appearing and not depressed appearing.     Impression & Recommendations:  Problem # 1:  HEALTH SCREENING (ICD-V70.0)  Td 2006 pneumonia shot 2007 flu shot  @ his job  patient is 6, will start doing PSAs and Hemoccults every  2 years until age 82 He never had a colonoscopy  diet exercise discussed  Orders: Venipuncture ([address]) TLB-BMP (Basic Metabolic Panel-BMET) (80048-METABOL) TLB-Hepatic/Liver Function Pnl (80076-HEPATIC) TLB-CBC Platelet - w/Differential (85025-CBCD) TLB-PSA (Prostate Specific Antigen) (84153-PSA) Specimen Handling ([address]) Specimen Handling ([address]11)  Problem # 2:  DIABETES MELLITUS, TYPE II (ICD-250.00)  per Dr. Everardo All His updated medication list for this problem includes:     Humalog Mix 75/25 Kwikpen 75-25 % Susp (Insulin lispro prot & lispro) .Marland KitchenMarland KitchenMarland KitchenMarland Kitchen 40 units each am, and 5 units with the evening meal, and pen needles two times a day    Lisinopril 10 Mg Tabs (Lisinopril) .Marland Kitchen... Take one tablet daily. due for office visit.  Labs Reviewed: Creat: 1.1 (06/27/2009)     Last Eye Exam: normal (06/04/2009) Reviewed HgBA1c results: 8.1 (09/22/2010)  8.4 (06/19/2010)  Problem # 3:  ?tendinitis L hand wil try a splinter, motrin as needed and ice. call if no better   Complete Medication List: 1)  Simvastatin 80 Mg Tabs (Simvastatin) .Marland Kitchen.. 1 once daily 2)  Humalog Mix 75/25 Kwikpen 75-25 % Susp (Insulin lispro prot & lispro) .... 40 units each am, and 5 units with the evening meal, and pen needles two times a day 3)  Lisinopril 10 Mg Tabs (Lisinopril) .... Take one tablet daily. due for office visit. 4)  Fluticasone Propionate 50 Mcg/act Susp (Fluticasone propionate) .Marland Kitchen.. 1 spray each nostril once daily 5)  Pen Needles 5/16" 31g X 8 Mm Misc (Insulin pen needle) .... As directed 6)  One Touch Ultra Test Strips  .... Checks bx 3x/day dx 250.00 7)  Onetouch Delica Lancets Misc (Lancets) .... Checks blood sugar 3x/day dx 250.00 8)  Cinnamon Pills   Patient Instructions: 1)  Please schedule a follow-up appointment in 1 year.    Orders Added: 1)  Venipuncture [36415] 2)  TLB-BMP (Basic Metabolic Panel-BMET) [80048-METABOL] 3)  TLB-Hepatic/Liver Function Pnl [80076-HEPATIC] 4)  TLB-CBC Platelet - w/Differential [85025-CBCD] 5)  TLB-PSA (Prostate Specific Antigen) [84153-PSA] 6)  Specimen Handling [99000] 7)  Specimen Handling [99000] 8)  Est. Patient age 22-64 [phone]

## 2010-11-27 NOTE — Miscellaneous (Signed)
Summary: simvastatin rx  Clinical Lists Changes  Medications: Added new medication of SIMVASTATIN 20 MG TABS (SIMVASTATIN) 1 by mouth qhs - Signed Rx of SIMVASTATIN 20 MG TABS (SIMVASTATIN) 1 by mouth qhs;  #30 x 6;  Signed;  Entered by: Shary Decamp;  Authorized by: Nolon Rod Paz MD;  Method used: Print then Mail to Patient    Prescriptions: SIMVASTATIN 20 MG TABS (SIMVASTATIN) 1 by mouth qhs  #30 x 6   Entered by:   Shary Decamp   Authorized by:   Nolon Rod. Paz MD   Signed by:   Shary Decamp on 12/10/2008   Method used:   Print then Mail to Patient   RxID:   [id number]

## 2010-11-27 NOTE — Assessment & Plan Note (Signed)
Summary: to go over lab work//lh   Vital Signs:  Patient profile:   45 year old male Weight:      208 pounds Pulse rate:   80 / minute BP sitting:   132 / 76  (right arm)  Vitals Entered By: Doristine Devoid (December 03, 2009 10:42 AM) CC: discuss labs    History of Present Illness: here to discuss  his most recent hemoglobin A1c, previously A1c's has been in the 6.0 range, the last A1c was 9  Allergies: No Known Drug Allergies  Past History:  Past Medical History: Reviewed history from 11/20/2009 and no changes required. Allergic rhinitis Diabetes mellitus, type II HYPERLIPIDEMIA  ERECTILE DYSFUNCTION   Past Surgical History: Reviewed history from 03/13/2008 and no changes required. no  Social History: Reviewed history from 11/20/2009 and no changes required. Divorced 1 child tobacco - quit 1995 ETOH - no exercise 3x/week diet-- does watch , gain wt during Xmas but now loosing wt   Review of Systems       since September 2010 his diet has changed very little he is exercising a lot less in the last few months good medication compliance with diabetic medicines has no checked his sugars lately  Physical Exam  General:  alert, well-developed, and well-nourished.     Impression & Recommendations:  Problem # 1:  DIABETES MELLITUS, TYPE II (ICD-250.00) we discuss his A1C results today we agreed that he will go back to exercise as before no change in medication patient was counseled about diet as well, printed material provided regards ambulatory CBG goals a new glucometer was provided will recheck a hemoglobin A1c to rule out a lab error   His updated medication list for this problem includes:    Lisinopril 10 Mg Tabs (Lisinopril) .Marland Kitchen... Take one tablet daily    Janumet 50-1000 Mg Tabs (Sitagliptin-metformin hcl) .Marland Kitchen... 1 by mouth two times a day  Orders: Venipuncture (65784) TLB-A1C / Hgb A1C (Glycohemoglobin) (83036-A1C)  Labs Reviewed: Creat: 1.1  (06/27/2009)     Last Eye Exam: normal (06/04/2009) Reviewed HgBA1c results: 9.0 (11/21/2009)  5.6 (06/27/2009)  Problem # 2:  time spent with the patient more than 15 minutes, more than 50% in counseling  Complete Medication List: 1)  Lisinopril 10 Mg Tabs (Lisinopril) .... Take one tablet daily 2)  Janumet 50-1000 Mg Tabs (Sitagliptin-metformin hcl) .Marland Kitchen.. 1 by mouth two times a day 3)  Simvastatin 20 Mg Tabs (Simvastatin) .Marland Kitchen.. 1 by mouth at bedtime 4)  Nasonex 50 Mcg/act Susp (Mometasone furoate) .... 2 sprays on each side once daily 5)  Claritin 10 Mg Tabs (Loratadine) .Marland Kitchen.. 1 by mouth at bedtime as needed allergies  Patient Instructions: 1)  Please schedule a follow-up appointment in 2 months.

## 2010-11-27 NOTE — Assessment & Plan Note (Signed)
Summary: per pt may appt--stc   Vital Signs:  Patient profile:   45 year old male Height:      72 inches (182.88 cm) Weight:      205.25 pounds (93.30 kg) O2 Sat:      93 % on Room air Temp:     98.3 degrees F (36.83 degrees C) oral Pulse rate:   85 / minute BP sitting:   110 / 80  (left arm) Cuff size:   large  Vitals Entered By: Josph Macho RMA (Mar 21, 2010 7:47 AM)  O2 Flow:  Room air CC: Follow-up visit/ pt needs refill on Claritin/ CF Is Patient Diabetic? Yes   Referring Provider:  Willow Ora MD Primary Provider:  Nolon Rod. Paz MD  CC:  Follow-up visit/ pt needs refill on Claritin/ CF.  History of Present Illness: the status of at least 3 ongoing medical problems is addressed today: dm:  pt has increased humalog 75/25 to 46 units once daily.  no cbg record, but states cbg's vary from 86-120.  no hypoglycemia.   goiter:  pt does not notice it. dyslipidemia:  he takes zocor as rx'ed, and tolerates well.    Current Medications (verified): 1)  Lisinopril 10 Mg  Tabs (Lisinopril) .... Take One Tablet Daily 2)  Simvastatin 20 Mg Tabs (Simvastatin) .Marland Kitchen.. 1 By Mouth At Bedtime 3)  Nasonex 50 Mcg/act Susp (Mometasone Furoate) .... 2 Sprays On Each Side Once Daily 4)  Claritin 10 Mg Tabs (Loratadine) .Marland Kitchen.. 1 By Mouth At Bedtime As Needed Allergies 5)  One Touch Ultra Test Strips .... Checks Bx 3x/day Dx 250.00 6)  Pen Needles 5/16" 31g X 8 Mm Misc (Insulin Pen Needle) .... As Directed 7)  Onetouch Delica Lancets  Misc (Lancets) .... Checks Blood Sugar 3x/day Dx 250.00 8)  Humalog Mix 75/25 Kwikpen 75-25 % Susp (Insulin Lispro Prot & Lispro) .... 30 Units Each Am, and Pen Needles Once Daily  Allergies (verified): No Known Drug Allergies  Past History:  Past Medical History: Last updated: 11/20/2009 Allergic rhinitis Diabetes mellitus, type II HYPERLIPIDEMIA  ERECTILE DYSFUNCTION   Review of Systems  The patient denies weight loss and weight gain.    Physical  Exam  General:  normal appearance.   Neck:  there is a diffuse goiter, approx 2x normal size.  no nodule palpable. Additional Exam:  LDL Cholesterol           97 mg/dL                    3-47 Hemoglobin A1C       [H]  7.3 %        Impression & Recommendations:  Problem # 1:  GOITER, MULTINODULAR (ICD-241.1) Assessment New  Problem # 2:  DIABETES MELLITUS, TYPE II (ICD-250.00) overcontrolled, given this regimen, which does not match insulin to her requirements  Problem # 3:  HYPERLIPIDEMIA (ICD-272.4) needs increased rx  Medications Added to Medication List This Visit: 1)  Humalog Mix 75/25 Kwikpen 75-25 % Susp (Insulin lispro prot & lispro) .... 40 units each am, and pen needles once daily 2)  Simvastatin 80 Mg Tabs (Simvastatin) .Marland Kitchen.. 1 once daily  Other Orders: TLB-Lipid Panel (80061-LIPID) TLB-A1C / Hgb A1C (Glycohemoglobin) (83036-A1C) Est. Patient Level IV ([address]95)  Patient Instructions: 1)  blood tests are being ordered for you today.  please call [phone] to hear your test results. 2)  pending the test results, please continue the same medications for now 3)  reduce humalog 75/25, to 40 units once daily. 4)  most of the time, a "lumpy thyroid" will eventually become overactive.  this is usually a slow process, happening over the span of many years. 5)  Please schedule a follow-up appointment in 3 months. 6)  (update: i left message on phone-tree:  increase zocor to 80/d) Prescriptions: SIMVASTATIN 80 MG TABS (SIMVASTATIN) 1 once daily  #30 x 11   Entered and Authorized by:   Minus Breeding MD   Signed by:   Minus Breeding MD on 03/21/2010   Method used:   Electronically to        [facility] # [phone]* (retail)       [address] [place], [place]  [address]       Ph: [phone]       Fax: [phone]   RxID:   [id number]

## 2010-11-27 NOTE — Progress Notes (Signed)
Summary: fyi blood sugar re  Phone Note Call from Patient Call back at Home Phone [phone]   Caller: Patient Summary of Call: blood sugar readings; --fri am ==164 --sat am -201   this am -124 Initial call taken by: Kandice Hams,  December 23, 2009 12:24 PM  Follow-up for Phone Call        likely once the diabetes is stabilized, the blurred vision will decrease let me know if he has a  headache, double vision or other symptoms otherwise continue with the same dose of Lantus and keep the follow-up with me as planned J. Paz MD  Called pt and left msg to call  re; Dr Drue Novel recommendatons .Kandice Hams  December 23, 2009 3:16 PM  Follow-up by:    Additional Follow-up for Phone Call Additional follow up Details #1::             Additional Follow-up for Phone Call Additional follow up Details #2::    pt called again states that he vision is very blury and is requesting a call back. Follow-up by: Barb Merino,  December 23, 2009 1:12 PM  Additional Follow-up for Phone Call Additional follow up Details #3:: Details for Additional Follow-up Action Taken: spoke with pt gave Dr Drue Novel recommendations; --pt is having double vision and headaches, symptoms  since 12/09/09  office visit --Tried to make visit with [facility] today, they would not see him until stable with pcp .Kandice Hams  December 23, 2009 4:55 PM  OV tomorrow please , ER if symptoms severe Jose E. Paz MD  December 23, 2009 5:10 PM  OV SCHEDULED  IN AM .Kandice Hams  December 23, 2009 5:17 PM  Additional Follow-up by: Kandice Hams,  December 23, 2009 5:17 PM

## 2010-11-28 NOTE — Consult Note (Signed)
Summary: [facility]   Imported By: Lanelle Bal 12/30/2009 10:41:14  _____________________________________________________________________  External Attachment:    Type:   Image     Comment:   External Document

## 2010-12-23 ENCOUNTER — Other Ambulatory Visit: Payer: Self-pay | Admitting: Endocrinology

## 2010-12-23 ENCOUNTER — Ambulatory Visit: Payer: Self-pay | Admitting: Endocrinology

## 2010-12-23 ENCOUNTER — Other Ambulatory Visit: Payer: BC Managed Care – PPO

## 2010-12-23 ENCOUNTER — Ambulatory Visit (INDEPENDENT_AMBULATORY_CARE_PROVIDER_SITE_OTHER): Payer: BC Managed Care – PPO | Admitting: Endocrinology

## 2010-12-23 ENCOUNTER — Encounter: Payer: Self-pay | Admitting: Endocrinology

## 2010-12-23 DIAGNOSIS — E119 Type 2 diabetes mellitus without complications: Secondary | ICD-10-CM

## 2010-12-23 LAB — HEMOGLOBIN A1C: Hgb A1c MFr Bld: 6.6 % — ABNORMAL HIGH (ref 4.6–6.5)

## 2011-01-01 NOTE — Assessment & Plan Note (Signed)
Summary: 3 MTH FU STC   Vital Signs:  Patient profile:   45 year old Webb Height:      72 inches (182.88 cm) Weight:      211.50 pounds (96.14 kg) BMI:     28.79 O2 Sat:      96 % on Room air Temp:     98.5 degrees F (36.94 degrees C) oral Pulse rate:   96 / minute Resp:     16 per minute BP sitting:   116 / 68  (left arm) Cuff size:   regular  Vitals Entered By: Burnard Leigh CMA(AAMA) (December 23, 2010 1:09 PM)  O2 Flow:  Room air CC: 50-month F/U.sls/cma   Referring Provider:  Willow Ora MD Primary Provider:  Nolon Rod. Paz MD  CC:  35-month F/U.sls/cma.  History of Present Illness: no cbg record, but states cbg's vary from 68 (before lunch, after an unexpectedly small breakfast) to 200's (other times of day).  pt states he feels well in general.  Current Medications (verified): 1)  Simvastatin 80 Mg Tabs (Simvastatin) .Marland Kitchen.. 1 Once Daily 2)  Humalog Mix 75/25 Kwikpen 75-25 % Susp (Insulin Lispro Prot & Lispro) .... 40 Units Each Am, and 5 Units With The Evening Meal, and Pen Needles Two Times A Day 3)  Lisinopril 10 Mg  Tabs (Lisinopril) .... Take One Tablet Daily. Due For Office Visit. 4)  Fluticasone Propionate 50 Mcg/act Susp (Fluticasone Propionate) .Marland Kitchen.. 1 Spray Each Nostril Once Daily 5)  Pen Needles 5/16" 31g X 8 Mm Misc (Insulin Pen Needle) .... As Directed 6)  One Touch Ultra Test Strips .... Checks Bx 3x/day Dx 250.00 7)  Onetouch Delica Lancets  Misc (Lancets) .... Checks Blood Sugar 3x/day Dx 250.00 8)  Cinnamon Pills  Allergies (verified): No Known Drug Allergies  Past History:  Past Medical History: Last updated: 11/20/2009 Allergic rhinitis Diabetes mellitus, type II HYPERLIPIDEMIA  ERECTILE DYSFUNCTION   Social History: Reviewed history from 11/12/2010 and no changes required. Divorced.  engaged to be remarried 1 child tobacco - quit 1995 works at [employer]. ETOH -- rarely exercise   ~ 1 x/week diet-- does watch   Review of Systems  The  patient denies syncope.    Physical Exam  General:  normal appearance.   Pulses:  dorsalis pedis intact bilat.   Extremities:  no deformity.  no ulcer on the feet.  feet are of normal color and temp.  no edema.  Neurologic:  sensation is intact to touch on the feet.  Additional Exam:  Hemoglobin A1C       [H]  6.6 %     Impression & Recommendations:  Problem # 1:  DIABETES MELLITUS, TYPE II (ICD-250.00) overcontrolled given this regimen, which does match insulin to his changing needs throughout the day  Medications Added to Medication List This Visit: 1)  Humalog Mix 75/25 Kwikpen 75-25 % Susp (Insulin lispro prot & lispro) .... 37 units each am, and 8 units with the evening meal, and pen needles two times a day 2)  Humalog Mix 75/25 Kwikpen 75-25 % Susp (Insulin lispro prot & lispro) .... 35 units each am, and 5 units with the evening meal, and pen needles two times a day 3)  Levitra 20 Mg Tabs (Vardenafil hcl) .... Take as needed  Other Orders: TLB-A1C / Hgb A1C (Glycohemoglobin) (83036-A1C) Est. Patient Level III (60454)  Patient Instructions: 1)  blood tests are being ordered for you today.  please call [phone] to hear  your test results. 2)  pending the test results, please change humalog 75/25, to 37 units each am, and 8 units with the evening meal.   3)  Please schedule a follow-up appointment in 3 months. 4)  i sent a precsription for levitra to your pharmacy.   5)  (update: i left message on phone-tree:  take just 35 units am and 5 units pm) Prescriptions: LEVITRA 20 MG TABS (VARDENAFIL HCL) take as needed  #10 x 11   Entered and Authorized by:   Minus Breeding MD   Signed by:   Minus Breeding MD on 12/23/2010   Method used:   Electronically to        [facility] (retail)       [address]       [place], [place]  [address]       Ph: [phone]       Fax: [phone]   RxID:   [id number]    Orders Added: 1)  TLB-A1C / Hgb A1C  (Glycohemoglobin) [83036-A1C] 2)  Est. Patient Level III [84132]

## 2011-03-10 ENCOUNTER — Telehealth: Payer: Self-pay

## 2011-03-10 NOTE — Telephone Encounter (Signed)
Pt called stating his CBGs have been elevated for the last 2 weeks. Pt has lowest reading in the morning 100's but by the end of the day raises to 200+. Pt is taking Insulin as prescribed and has appt 05/30 but is requesting advisement until upcoming appt.

## 2011-03-10 NOTE — Telephone Encounter (Signed)
Increase insulin to 2 units with breakfast, and continue 8 with supper. Ret as scheduled

## 2011-03-11 NOTE — Telephone Encounter (Signed)
Pt advised and will call if needed.

## 2011-03-11 NOTE — Telephone Encounter (Signed)
It should read 42 with breakfast, and 8 with supper.

## 2011-03-11 NOTE — Telephone Encounter (Signed)
Pt takes 37 units with breakfast, increase to 39?  please advise

## 2011-03-25 ENCOUNTER — Ambulatory Visit (INDEPENDENT_AMBULATORY_CARE_PROVIDER_SITE_OTHER): Payer: BC Managed Care – PPO | Admitting: Endocrinology

## 2011-03-25 ENCOUNTER — Other Ambulatory Visit (INDEPENDENT_AMBULATORY_CARE_PROVIDER_SITE_OTHER): Payer: BC Managed Care – PPO

## 2011-03-25 ENCOUNTER — Encounter: Payer: Self-pay | Admitting: Endocrinology

## 2011-03-25 VITALS — BP 104/66 | HR 86 | Temp 98.4°F | Ht 72.0 in | Wt 212.0 lb

## 2011-03-25 DIAGNOSIS — E119 Type 2 diabetes mellitus without complications: Secondary | ICD-10-CM

## 2011-03-25 LAB — HEMOGLOBIN A1C: Hgb A1c MFr Bld: 7.4 % — ABNORMAL HIGH (ref 4.6–6.5)

## 2011-03-25 NOTE — Patient Instructions (Addendum)
blood tests are being ordered for you today.  please call [phone] to hear your test results.  You will be prompted to enter the 9-digit "MRN" number that appears at the top left of this page, followed by #.  Then you will hear the message. Please make a follow-up appointment in 3 months check your blood sugar 2 times a day.  vary the time of day when you check, between before the 3 meals, and at bedtime.  also check if you have symptoms of your blood sugar being too high or too low.  please keep a record of the readings and bring it to your next appointment here.  please call us sooner if you are having low blood sugar episodes.

## 2011-03-25 NOTE — Progress Notes (Signed)
Subjective:    Patient ID: Wayne Webb, male    DOB: 05-Mar-1966, 45 y.o.   MRN: [id number]  HPI pt states he feels well in general.  He still takes 42 units am and 8 with the evening meal.  no cbg record, but states cbg's are seldom low, and these episodes are mild.  He says it is lowest in am.  Past Medical History  Diagnosis Date  . HYPERLIPIDEMIA 03/15/2007  . GOITER, MULTINODULAR 03/21/2010  . DIABETES MELLITUS, TYPE II 03/15/2007  . ERECTILE DYSFUNCTION 03/15/2007  . Diplopia 12/24/2009  . ALLERGIC RHINITIS 03/15/2007    No past surgical history on file.  History   Social History  . Marital Status: Divorced    Spouse Name: N/A    Number of Children: N/A  . Years of Education: N/A   Occupational History  .  [employer]   Social History Main Topics  . Smoking status: Former Smoker    Quit date: 10/26/1993  . Smokeless tobacco: Not on file  . Alcohol Use: Yes     rarely  . Drug Use:   . Sexually Active:    Other Topics Concern  . Not on file   Social History Narrative   Divorced. Engaged to be remarriedWorks at [employer] devicesExercise ~1 x/ weekDiet-does watch    Current Outpatient Prescriptions on File Prior to Visit  Medication Sig Dispense Refill  . Cinnamon 500 MG capsule Take 500 mg by mouth daily.        . fluticasone (FLONASE) 50 MCG/ACT nasal spray Place 1 spray into the nose daily.        Marland Kitchen glucose blood (ONE TOUCH ULTRA TEST) test strip Use as instructed to check blood sugar three times a day dx 250.00       . insulin lispro protamine-insulin lispro (HUMALOG MIX 75/25 KWIKPEN) (75-25) 100 UNIT/ML SUSP Inject into the skin. 42 units every morning and 8 units with the evening meal      . Insulin Pen Needle (PEN NEEDLES 31GX5/16") 31G X 8 MM MISC As directed two times a day       . lisinopril (PRINIVIL,ZESTRIL) 10 MG tablet Take 10 mg by mouth daily.        Letta Pate DELICA LANCETS MISC Use as directed three times a day dx 250.00       .  simvastatin (ZOCOR) 80 MG tablet Take 80 mg by mouth daily.        . vardenafil (LEVITRA) 20 MG tablet Take 20 mg by mouth as needed.          No Known Allergies  Family History  Problem Relation Age of Onset  . Heart disease Mother     CAD-CABG-onset in her early 64's  . Diabetes Mother 39    had DM from her 60's  . Hypertension Mother   . Cancer Father     Prostate Cancer  . Stroke Neg Hx     BP 104/66  Pulse 86  Temp(Src) 98.4 F (36.9 C) (Oral)  Ht 6' (1.829 m)  Wt 212 lb (96.163 kg)  BMI 28.75 kg/m2  SpO2 95%    Review of Systems Denies loc.    Objective:   Physical Exam Pulses: dorsalis pedis intact bilat.   Feet: no deformity.  no ulcer on the feet.  feet are of normal color and temp.  no edema Neuro: sensation is intact to touch on the feet.  Assessment & Plan:  Dm.  He may need another reduction of insulin, if a1c is below 7.

## 2011-05-14 ENCOUNTER — Other Ambulatory Visit: Payer: Self-pay | Admitting: Internal Medicine

## 2011-05-14 DIAGNOSIS — E785 Hyperlipidemia, unspecified: Secondary | ICD-10-CM

## 2011-05-15 ENCOUNTER — Other Ambulatory Visit (INDEPENDENT_AMBULATORY_CARE_PROVIDER_SITE_OTHER): Payer: BC Managed Care – PPO

## 2011-05-15 ENCOUNTER — Other Ambulatory Visit: Payer: Self-pay

## 2011-05-15 DIAGNOSIS — E119 Type 2 diabetes mellitus without complications: Secondary | ICD-10-CM

## 2011-05-15 DIAGNOSIS — E785 Hyperlipidemia, unspecified: Secondary | ICD-10-CM

## 2011-05-15 LAB — BASIC METABOLIC PANEL WITH GFR
BUN: 26 mg/dL — ABNORMAL HIGH (ref 6–23)
CO2: 28 meq/L (ref 19–32)
Calcium: 10.1 mg/dL (ref 8.4–10.5)
Chloride: 103 meq/L (ref 96–112)
Creatinine, Ser: 1.4 mg/dL (ref 0.4–1.5)
GFR: 69.22 mL/min
Glucose, Bld: 180 mg/dL — ABNORMAL HIGH (ref 70–99)
Potassium: 3.8 meq/L (ref 3.5–5.1)
Sodium: 138 meq/L (ref 135–145)

## 2011-05-15 LAB — ALT: ALT: 92 U/L — ABNORMAL HIGH (ref 0–53)

## 2011-05-15 LAB — AST: AST: 62 U/L — ABNORMAL HIGH (ref 0–37)

## 2011-05-18 NOTE — Progress Notes (Signed)
Labs only

## 2011-05-20 ENCOUNTER — Telehealth: Payer: Self-pay | Admitting: *Deleted

## 2011-05-20 DIAGNOSIS — N189 Chronic kidney disease, unspecified: Secondary | ICD-10-CM

## 2011-05-20 NOTE — Telephone Encounter (Signed)
Pt aware of labs and referral to be placed.

## 2011-05-20 NOTE — Telephone Encounter (Signed)
Message copied by Leanne Lovely on Wed May 20, 2011 10:13 AM ------      Message from: Willow Ora E      Created: Tue May 19, 2011  5:59 PM       These are followup labs :      LFTs continue to be elevated, kidney function has decreased slightly more.      Plan:      Discontinue Zocor      Schedule a renal ultrasound, DX chronic renal  insufficiency, diabetes      I believe he is taking over-the-counter herbal medicines for diabetes, I also rec, to discontinue those.      Schedule office visit here in 6 weeks

## 2011-05-20 NOTE — Telephone Encounter (Signed)
Message left for patient to return my call.  

## 2011-05-22 ENCOUNTER — Ambulatory Visit
Admission: RE | Admit: 2011-05-22 | Discharge: 2011-05-22 | Disposition: A | Discharge status: Home / Self Care | Payer: BC Managed Care – PPO | Source: Ambulatory Visit | Attending: Internal Medicine | Admitting: Internal Medicine

## 2011-05-22 DIAGNOSIS — N189 Chronic kidney disease, unspecified: Secondary | ICD-10-CM

## 2011-05-26 ENCOUNTER — Telehealth: Payer: Self-pay | Admitting: *Deleted

## 2011-05-26 NOTE — Telephone Encounter (Signed)
Message left for patient to return my call.  

## 2011-05-26 NOTE — Telephone Encounter (Signed)
Message copied by Leanne Lovely on Tue May 26, 2011 10:08 AM ------      Message from: Wanda Plump      Created: Tue May 26, 2011  8:09 AM       Advise patient:      U/s ok

## 2011-05-27 NOTE — Telephone Encounter (Signed)
Message left for patient to return my call.  

## 2011-05-27 NOTE — Telephone Encounter (Signed)
Pt is aware.  

## 2011-07-01 ENCOUNTER — Other Ambulatory Visit (INDEPENDENT_AMBULATORY_CARE_PROVIDER_SITE_OTHER): Payer: BC Managed Care – PPO

## 2011-07-01 ENCOUNTER — Ambulatory Visit (INDEPENDENT_AMBULATORY_CARE_PROVIDER_SITE_OTHER): Payer: BC Managed Care – PPO | Admitting: Endocrinology

## 2011-07-01 ENCOUNTER — Encounter: Payer: Self-pay | Admitting: Endocrinology

## 2011-07-01 VITALS — BP 102/76 | HR 87 | Temp 98.7°F | Ht 72.0 in | Wt 214.8 lb

## 2011-07-01 DIAGNOSIS — E119 Type 2 diabetes mellitus without complications: Secondary | ICD-10-CM

## 2011-07-01 LAB — HEMOGLOBIN A1C: Hgb A1c MFr Bld: 6 % (ref 4.6–6.5)

## 2011-07-01 MED ORDER — INSULIN PEN NEEDLE 31G X 5 MM MISC: 1.0000 | Freq: Two times a day (BID) | Status: DC

## 2011-07-01 MED ORDER — FLUTICASONE PROPIONATE 50 MCG/ACT NA SUSP: 1.0000 | Freq: Every day | NASAL | Status: DC

## 2011-07-01 NOTE — Progress Notes (Signed)
Subjective:    Patient ID: Wayne Webb, male    DOB: 02/02/1966, 45 y.o.   MRN: [id number]  HPI pt states he feels well in general.  no cbg record, but pt says he has had to takes less than the prescribed dosage, due to mild hypoglycemia during the day.   Pt states many years of intermittent congestion in the nose, and assoc rhinorrhea.   Past Medical History  Diagnosis Date  . HYPERLIPIDEMIA 03/15/2007  . GOITER, MULTINODULAR 03/21/2010  . DIABETES MELLITUS, TYPE II 03/15/2007  . ERECTILE DYSFUNCTION 03/15/2007  . Diplopia 12/24/2009  . ALLERGIC RHINITIS 03/15/2007    No past surgical history on file.  History   Social History  . Marital Status: Divorced    Spouse Name: N/A    Number of Children: N/A  . Years of Education: N/A   Occupational History  .  [employer]   Social History Main Topics  . Smoking status: Former Smoker    Quit date: 10/26/1993  . Smokeless tobacco: Not on file  . Alcohol Use: Yes     rarely  . Drug Use:   . Sexually Active:    Other Topics Concern  . Not on file   Social History Narrative   Divorced. Engaged to be remarriedWorks at [employer] devicesExercise ~1 x/ weekDiet-does watch    Current Outpatient Prescriptions on File Prior to Visit  Medication Sig Dispense Refill  . Cinnamon 500 MG capsule Take 500 mg by mouth daily.        . fluticasone (FLONASE) 50 MCG/ACT nasal spray Place 1 spray into the nose daily.        Marland Kitchen glucose blood (ONE TOUCH ULTRA TEST) test strip Use as instructed to check blood sugar three times a day dx 250.00       . insulin lispro protamine-insulin lispro (HUMALOG MIX 75/25 KWIKPEN) (75-25) 100 UNIT/ML SUSP Inject into the skin. 38 units with breakfast, and 8 units with the evening meal      . Insulin Pen Needle (PEN NEEDLES 31GX5/16") 31G X 8 MM MISC As directed two times a day       . lisinopril (PRINIVIL,ZESTRIL) 10 MG tablet Take 10 mg by mouth daily.        Letta Pate DELICA LANCETS MISC Use as  directed three times a day dx 250.00       . simvastatin (ZOCOR) 80 MG tablet Take 80 mg by mouth daily.        . vardenafil (LEVITRA) 20 MG tablet Take 20 mg by mouth as needed.         No Known Allergies  Family History  Problem Relation Age of Onset  . Heart disease Mother     CAD-CABG-onset in her early 89's  . Diabetes Mother 58    had DM from her 63's  . Hypertension Mother   . Cancer Father     Prostate Cancer  . Stroke Neg Hx    BP 102/76  Pulse 87  Temp(Src) 98.7 F (37.1 C) (Oral)  Ht 6' (1.829 m)  Wt 214 lb 12.8 oz (97.433 kg)  BMI 29.13 kg/m2  SpO2 96%  Review of Systems Denies loc and earache.    Objective:   Physical Exam VITAL SIGNS:  See vs page GENERAL: no distress head: no deformity eyes: no periorbital swelling, no proptosis external nose and ears are normal mouth: no lesion seen Both eac's and tm's are normal SKIN: Insulin injection  sites at the anterior abdomen are normal     Assessment & Plan:  Dm, therapy limited by noncompliance with cbg's.  i'll do the best i can.  Also, therapy is limited by pt's need for a simple regimen Allergic rhinitis, recurrent.

## 2011-07-01 NOTE — Patient Instructions (Addendum)
blood tests are being ordered for you today.  please call [phone] to hear your test results.  You will be prompted to enter the 9-digit "MRN" number that appears at the top left of this page, followed by #.  Then you will hear the message. pending the test results, please reduce the insulin to 38 units with breakfast, and 8 with the evening meal Please make a follow-up appointment in 3 months. check your blood sugar 2 times a day.  vary the time of day when you check, between before the 3 meals, and at bedtime.  also check if you have symptoms of your blood sugar being too high or too low.  please keep a record of the readings and bring it to your next appointment here.  please call us sooner if you are having low blood sugar episodes. i have sent a prescription to your pharmacy, to refill the flonase, and for shorter pen needles.

## 2011-09-08 ENCOUNTER — Other Ambulatory Visit: Payer: Self-pay | Admitting: Internal Medicine

## 2011-09-08 NOTE — Telephone Encounter (Signed)
Done

## 2011-09-28 ENCOUNTER — Other Ambulatory Visit: Payer: Self-pay | Admitting: Endocrinology

## 2011-09-30 ENCOUNTER — Ambulatory Visit: Payer: BC Managed Care – PPO | Admitting: Endocrinology

## 2011-10-07 ENCOUNTER — Ambulatory Visit (INDEPENDENT_AMBULATORY_CARE_PROVIDER_SITE_OTHER): Payer: BC Managed Care – PPO | Admitting: Endocrinology

## 2011-10-07 ENCOUNTER — Other Ambulatory Visit (INDEPENDENT_AMBULATORY_CARE_PROVIDER_SITE_OTHER): Payer: BC Managed Care – PPO

## 2011-10-07 ENCOUNTER — Encounter: Payer: Self-pay | Admitting: Endocrinology

## 2011-10-07 VITALS — BP 102/68 | HR 75 | Temp 98.1°F | Ht 72.0 in | Wt 214.0 lb

## 2011-10-07 DIAGNOSIS — E119 Type 2 diabetes mellitus without complications: Secondary | ICD-10-CM

## 2011-10-07 LAB — HEMOGLOBIN A1C: Hgb A1c MFr Bld: 8.3 % — ABNORMAL HIGH (ref 4.6–6.5)

## 2011-10-07 NOTE — Patient Instructions (Addendum)
blood tests are being ordered for you today.  please call [phone] to hear your test results.  You will be prompted to enter the 9-digit "MRN" number that appears at the top left of this page, followed by #.  Then you will hear the message. pending the test results, please take the insulin 38 units with breakfast, and 8 with the evening meal Please make a follow-up appointment in 3 months. check your blood sugar 2 times a day.  vary the time of day when you check, between before the 3 meals, and at bedtime.  also check if you have symptoms of your blood sugar being too high or too low.  please keep a record of the readings and bring it to your next appointment here.  please call us sooner if you are having low blood sugar episodes.  (update: i left message on phone-tree:  Take insulin 38 units am and 12 units pm).

## 2011-10-07 NOTE — Progress Notes (Signed)
Subjective:    Patient ID: Wayne Webb, male    DOB: 1966-01-07, 45 y.o.   MRN: [id number]  HPI Pt returns for f/u of insulin-requiring DM (2004).  He did not reduce insulin as rx'ed, because he says his cbg's are too high.  no cbg record, but states cbg's vary from 70-200.  It is in general lowest in the late morning. Past Medical History  Diagnosis Date  . HYPERLIPIDEMIA 03/15/2007  . GOITER, MULTINODULAR 03/21/2010  . DIABETES MELLITUS, TYPE II 03/15/2007  . ERECTILE DYSFUNCTION 03/15/2007  . Diplopia 12/24/2009  . ALLERGIC RHINITIS 03/15/2007    No past surgical history on file.  History   Social History  . Marital Status: Divorced    Spouse Name: N/A    Number of Children: N/A  . Years of Education: N/A   Occupational History  .  [employer]   Social History Main Topics  . Smoking status: Former Smoker    Quit date: 10/26/1993  . Smokeless tobacco: Not on file  . Alcohol Use: Yes     rarely  . Drug Use:   . Sexually Active:    Other Topics Concern  . Not on file   Social History Narrative   Divorced. Engaged to be remarriedWorks at [employer] devicesExercise ~1 x/ weekDiet-does watch    Current Outpatient Prescriptions on File Prior to Visit  Medication Sig Dispense Refill  . Cinnamon 500 MG capsule Take 500 mg by mouth daily.        . fluticasone (FLONASE) 50 MCG/ACT nasal spray Place 1 spray into the nose daily.  16 g  11  . glucose blood (ONE TOUCH ULTRA TEST) test strip Use as instructed to check blood sugar three times a day dx 250.00       . Insulin Pen Needle 31G X 5 MM MISC 1 Device by Does not apply route 2 (two) times daily.  60 each  11  . lisinopril (PRINIVIL,ZESTRIL) 10 MG tablet Take 10 mg by mouth daily.        Letta Pate DELICA LANCETS MISC USE 3 TIMES DAILY FOR CHECKING BLOOD SUGARS  100 each  3  . simvastatin (ZOCOR) 80 MG tablet Take 80 mg by mouth daily.        . vardenafil (LEVITRA) 20 MG tablet Take 20 mg by mouth as needed.           No Known Allergies  Family History  Problem Relation Age of Onset  . Heart disease Mother     CAD-CABG-onset in her early 32's  . Diabetes Mother 20    had DM from her 30's  . Hypertension Mother   . Cancer Father     Prostate Cancer  . Stroke Neg Hx     BP 102/68  Pulse 75  Temp(Src) 98.1 F (36.7 C) (Oral)  Ht 6' (1.829 m)  Wt 214 lb (97.07 kg)  BMI 29.02 kg/m2  SpO2 96%    Review of Systems denies hypoglycemia.    Objective:   Physical Exam VITAL SIGNS:  See vs page GENERAL: no distress. Pulses: dorsalis pedis intact bilat.   Feet: no deformity.  no ulcer on the feet.  feet are of normal color and temp.  no edema. Neuro: sensation is intact to touch on the feet.      Lab Results  Component Value Date   HGBA1C 8.3* 10/07/2011      Assessment & Plan:  DM, needs increased rx

## 2011-10-28 ENCOUNTER — Other Ambulatory Visit: Payer: Self-pay | Admitting: *Deleted

## 2011-10-28 MED ORDER — INSULIN LISPRO PROT & LISPRO (75-25 MIX) 100 UNIT/ML ~~LOC~~ SUSP: SUBCUTANEOUS | Status: DC

## 2011-10-28 NOTE — Telephone Encounter (Signed)
Pt called needing rx for Humalog 75/25 insulin sent to mail order pharmacy. Rx sent to [facility].

## 2011-11-06 ENCOUNTER — Other Ambulatory Visit: Payer: Self-pay | Admitting: Internal Medicine

## 2011-11-09 ENCOUNTER — Other Ambulatory Visit: Payer: Self-pay | Admitting: *Deleted

## 2011-11-09 MED ORDER — GLUCOSE BLOOD VI STRP: ORAL_STRIP | Status: DC

## 2011-11-09 MED ORDER — INSULIN PEN NEEDLE 31G X 5 MM MISC: Status: DC

## 2011-11-09 NOTE — Telephone Encounter (Signed)
R'cd fax from [facility] for refill of Pen needles and test strips

## 2011-11-20 ENCOUNTER — Ambulatory Visit (INDEPENDENT_AMBULATORY_CARE_PROVIDER_SITE_OTHER): Payer: BC Managed Care – PPO | Admitting: Internal Medicine

## 2011-11-20 DIAGNOSIS — R945 Abnormal results of liver function studies: Secondary | ICD-10-CM

## 2011-11-20 DIAGNOSIS — R7989 Other specified abnormal findings of blood chemistry: Secondary | ICD-10-CM

## 2011-11-20 DIAGNOSIS — E785 Hyperlipidemia, unspecified: Secondary | ICD-10-CM

## 2011-11-20 DIAGNOSIS — H579 Unspecified disorder of eye and adnexa: Secondary | ICD-10-CM

## 2011-11-20 DIAGNOSIS — R0989 Other specified symptoms and signs involving the circulatory and respiratory systems: Secondary | ICD-10-CM

## 2011-11-20 DIAGNOSIS — Z0000 Encounter for general adult medical examination without abnormal findings: Secondary | ICD-10-CM

## 2011-11-20 DIAGNOSIS — R9389 Abnormal findings on diagnostic imaging of other specified body structures: Secondary | ICD-10-CM

## 2011-11-20 DIAGNOSIS — R0609 Other forms of dyspnea: Secondary | ICD-10-CM | POA: Insufficient documentation

## 2011-11-20 DIAGNOSIS — R0600 Dyspnea, unspecified: Secondary | ICD-10-CM

## 2011-11-20 LAB — COMPREHENSIVE METABOLIC PANEL
ALT: 60 U/L — ABNORMAL HIGH (ref 0–53)
AST: 45 U/L — ABNORMAL HIGH (ref 0–37)
Albumin: 4.2 g/dL (ref 3.5–5.2)
Alkaline Phosphatase: 110 U/L (ref 39–117)
BUN: 28 mg/dL — ABNORMAL HIGH (ref 6–23)
CO2: 27 mEq/L (ref 19–32)
Calcium: 9.4 mg/dL (ref 8.4–10.5)
Chloride: 102 mEq/L (ref 96–112)
Creatinine, Ser: 1.2 mg/dL (ref 0.4–1.5)
GFR: 82.4 mL/min (ref >60.00)
Glucose, Bld: 153 mg/dL — ABNORMAL HIGH (ref 70–99)
Potassium: 3.8 mEq/L (ref 3.5–5.1)
Sodium: 138 mEq/L (ref 135–145)
Total Bilirubin: 1.5 mg/dL — ABNORMAL HIGH (ref 0.3–1.2)
Total Protein: 8.3 g/dL (ref 6.0–8.3)

## 2011-11-20 LAB — CBC WITH DIFFERENTIAL/PLATELET
Basophils Absolute: 0 10*3/uL (ref 0.0–0.1)
Basophils Relative: 0.9 % (ref 0.0–3.0)
Eosinophils Absolute: 0.2 10*3/uL (ref 0.0–0.7)
Eosinophils Relative: 6.1 % — ABNORMAL HIGH (ref 0.0–5.0)
HCT: 39.3 % (ref 39.0–52.0)
Hemoglobin: 13.6 g/dL (ref 13.0–17.0)
Lymphocytes Relative: 22.9 % (ref 12.0–46.0)
Lymphs Abs: 0.7 10*3/uL (ref 0.7–4.0)
MCHC: 34.5 g/dL (ref 30.0–36.0)
MCV: 87.2 fl (ref 78.0–100.0)
Monocytes Absolute: 0.6 10*3/uL (ref 0.1–1.0)
Monocytes Relative: 18.5 % — ABNORMAL HIGH (ref 3.0–12.0)
Neutro Abs: 1.7 10*3/uL (ref 1.4–7.7)
Neutrophils Relative %: 51.6 % (ref 43.0–77.0)
Platelets: 193 10*3/uL (ref 150.0–400.0)
RBC: 4.51 Mil/uL (ref 4.22–5.81)
RDW: 12.4 % (ref 11.5–14.6)
WBC: 3.2 10*3/uL — ABNORMAL LOW (ref 4.5–10.5)

## 2011-11-20 LAB — LIPID PANEL
Cholesterol: 185 mg/dL (ref 0–200)
HDL: 43.5 mg/dL (ref >39.00)
LDL Cholesterol: 121 mg/dL — ABNORMAL HIGH (ref 0–99)
Total CHOL/HDL Ratio: 4
Triglycerides: 101 mg/dL (ref 0.0–149.0)
VLDL: 20.2 mg/dL (ref 0.0–40.0)

## 2011-11-20 LAB — TSH: TSH: 0.39 u[IU]/mL (ref 0.35–5.50)

## 2011-11-20 LAB — SEDIMENTATION RATE: Sed Rate: 35 mm/hr — ABNORMAL HIGH (ref 0–22)

## 2011-11-20 NOTE — Assessment & Plan Note (Addendum)
Off simvastatin for several months due to increased LFTs. Recheck LFTs.

## 2011-11-20 NOTE — Assessment & Plan Note (Addendum)
Patient presents with dyspnea on exertion, see history of present illness. EKG at [facility] except for TWI AVF This could represent atypical CAD. We will schedule a stress test We'll also do general labs He has dry crackles at the bases date lungs, his ophthalmologist has noted lesions that could be from sarcoid. Will order a chest x-ray and PFTs.

## 2011-11-20 NOTE — Assessment & Plan Note (Addendum)
Td 2006 pneumonia shot 2007 flu shot  @ his job  Prostate cancer screening every 2 years, last PSA normal 10-2010, next PSA if in 2014. he never had a colonoscopy  diet discuss, recommend exercise once he's cleared

## 2011-11-20 NOTE — Assessment & Plan Note (Addendum)
I spoke with Dr. Gypsy Balsam in reference to the abnormal eye exam. He saw evidence of previous uveitis , h/o sarcoidosis?? For now I will check ACE level and a sedimentation rate.

## 2011-11-20 NOTE — Progress Notes (Signed)
  Subjective:    Patient ID: Wayne Webb, male    DOB: 05-Jan-1966, 46 y.o.   MRN: [id number]  HPI Complete physical exam, has also other issues. --Since November he has noted dyspnea on exertion. He gets short of breath with exercise or by doing any heavy lifting, previously was able to do all that without problems. No dyspnea on exertion with activities of daily living. --He reports a single episode of chest pain at the time of the onset of the symptoms, no radiation, he did "feel funny all over" at the time. Denies any lower extremity edema, airplane trip, orthopnea. --Also he was seen by his eye doctor, not reviewed, they noted a retinal lesion.  --Also simvastatin was discontinue few months ago due to increased LFTs, due for a checkup.  Past Medical History: Diabetes mellitus-- Dr Everardo All Hyperlipidemia CRI, creat 1.4 , renal u/s 04-2011 neg Goiter   ED Allergic rhinitis  Past Surgical History: no  Family History: CAD - M (CABG) onset  in her early 68s DM - M (deceased, had dm from her 28's) HTN - M Stroke - no colon Ca - no prostate Ca - F, age of Dx late 72s  Social History: Remarried, 1 child tobacco - quit 1995 ETOH-- rarely  works at [employer]. exercise  None recently diet-- does watch   Review of Systems No nausea, vomiting, diarrhea or blood in the stools. No dysuria gross hematuria. No anxiety or depression. Occasional dry cough.No heartburn.      Objective:   Physical Exam  Constitutional: He is oriented to person, place, and time. He appears well-developed and well-nourished. No distress.  HENT:  Head: Normocephalic and atraumatic.  Neck: No JVD present. No thyromegaly present.  Cardiovascular: Normal rate, regular rhythm and normal heart sounds.   No murmur heard.      Normal pedal pulses  Pulmonary/Chest:       Few dry crackles at the bases?. Otherwise clear.  Abdominal: Soft. Bowel sounds are normal. He exhibits no distension. There  is no tenderness. There is no rebound and no guarding.  Musculoskeletal: He exhibits no edema.  Neurological: He is alert and oriented to person, place, and time.  Skin: He is not diaphoretic.  Psychiatric: He has a normal mood and affect. His behavior is normal. Thought content normal.       Assessment & Plan:  In addition of his CPX we spent more than 25 minutes evaluation his DOE, discussing the case with ophthalmology, see a/p

## 2011-11-21 ENCOUNTER — Encounter: Payer: Self-pay | Admitting: Internal Medicine

## 2011-11-21 LAB — ANGIOTENSIN CONVERTING ENZYME: Angiotensin-Converting Enzyme: 13 U/L (ref 8–52)

## 2011-11-26 ENCOUNTER — Ambulatory Visit (INDEPENDENT_AMBULATORY_CARE_PROVIDER_SITE_OTHER)
Admission: RE | Admit: 2011-11-26 | Discharge: 2011-11-26 | Disposition: A | Discharge status: Home / Self Care | Payer: BC Managed Care – PPO | Source: Ambulatory Visit | Attending: Internal Medicine | Admitting: Internal Medicine

## 2011-11-26 DIAGNOSIS — R0989 Other specified symptoms and signs involving the circulatory and respiratory systems: Secondary | ICD-10-CM

## 2011-11-26 DIAGNOSIS — R0600 Dyspnea, unspecified: Secondary | ICD-10-CM

## 2011-11-26 DIAGNOSIS — R0609 Other forms of dyspnea: Secondary | ICD-10-CM

## 2011-11-29 ENCOUNTER — Telehealth: Payer: Self-pay | Admitting: Internal Medicine

## 2011-11-29 DIAGNOSIS — R9389 Abnormal findings on diagnostic imaging of other specified body structures: Secondary | ICD-10-CM

## 2011-11-29 NOTE — Telephone Encounter (Signed)
Advise patient LFTs still slightly elevated but better. Chest x-ray show a lot of "scarring". He needs to go ahead and do the PFTs and stress test. From my side I'm referring him to a pulmonologist for evaluation of the abnormal chest x-ray. I entered the referral.

## 2011-11-30 NOTE — Telephone Encounter (Signed)
LMOVM for pt to return call 

## 2011-12-03 ENCOUNTER — Encounter (HOSPITAL_COMMUNITY): Payer: BC Managed Care – PPO | Admitting: Radiology

## 2011-12-08 ENCOUNTER — Ambulatory Visit (INDEPENDENT_AMBULATORY_CARE_PROVIDER_SITE_OTHER): Payer: BC Managed Care – PPO | Admitting: Internal Medicine

## 2011-12-08 DIAGNOSIS — R0609 Other forms of dyspnea: Secondary | ICD-10-CM

## 2011-12-08 DIAGNOSIS — R0600 Dyspnea, unspecified: Secondary | ICD-10-CM

## 2011-12-08 DIAGNOSIS — R0989 Other specified symptoms and signs involving the circulatory and respiratory systems: Secondary | ICD-10-CM

## 2011-12-08 LAB — PULMONARY FUNCTION TEST

## 2011-12-08 NOTE — Progress Notes (Signed)
PFT done today. 

## 2011-12-09 ENCOUNTER — Ambulatory Visit (HOSPITAL_COMMUNITY): Payer: BC Managed Care – PPO | Attending: Cardiovascular Disease | Admitting: Radiology

## 2011-12-09 VITALS — BP 110/66 | Ht 72.0 in | Wt 210.0 lb

## 2011-12-09 DIAGNOSIS — E119 Type 2 diabetes mellitus without complications: Secondary | ICD-10-CM | POA: Insufficient documentation

## 2011-12-09 DIAGNOSIS — Z8249 Family history of ischemic heart disease and other diseases of the circulatory system: Secondary | ICD-10-CM | POA: Insufficient documentation

## 2011-12-09 DIAGNOSIS — R079 Chest pain, unspecified: Secondary | ICD-10-CM

## 2011-12-09 DIAGNOSIS — E785 Hyperlipidemia, unspecified: Secondary | ICD-10-CM | POA: Insufficient documentation

## 2011-12-09 DIAGNOSIS — R0600 Dyspnea, unspecified: Secondary | ICD-10-CM

## 2011-12-09 DIAGNOSIS — R0989 Other specified symptoms and signs involving the circulatory and respiratory systems: Secondary | ICD-10-CM | POA: Insufficient documentation

## 2011-12-09 DIAGNOSIS — R0602 Shortness of breath: Secondary | ICD-10-CM

## 2011-12-09 DIAGNOSIS — Z794 Long term (current) use of insulin: Secondary | ICD-10-CM | POA: Insufficient documentation

## 2011-12-09 DIAGNOSIS — Z87891 Personal history of nicotine dependence: Secondary | ICD-10-CM | POA: Insufficient documentation

## 2011-12-09 DIAGNOSIS — R0609 Other forms of dyspnea: Secondary | ICD-10-CM | POA: Insufficient documentation

## 2011-12-09 MED ORDER — TECHNETIUM TC 99M TETROFOSMIN IV KIT: 30.0000 | PACK | Freq: Once | INTRAVENOUS | Status: DC | PRN

## 2011-12-09 MED ORDER — TECHNETIUM TC 99M TETROFOSMIN IV KIT: 10.0000 | PACK | Freq: Once | INTRAVENOUS | Status: DC | PRN

## 2011-12-09 NOTE — Progress Notes (Signed)
[facility] SITE 3 NUCLEAR MED [address] [place] [address] [phone]  Cardiology Nuclear Med Study  Wayne Webb is a 46 y.o. male [id number] [birth year]   Nuclear Med Background Indication for Stress Test:  Evaluation for Ischemia History:  No previous documented CAD Cardiac Risk Factors: Family History - CAD, History of Smoking, IDDM Type 2 and Lipids  Symptoms:  Chest Pain and DOE   Nuclear Pre-Procedure Caffeine/Decaff Intake:  None NPO After: 11:00pm   Lungs:  clear IV 0.9% NS with Angio Cath:  20g  IV Site: R Antecubital  IV Started by:  Stanton Kidney, EMT-P  Chest Size (in):  46 Cup Size: n/a  Height: 6' (1.829 m)  Weight:  210 lb (95.255 kg)  BMI:  Body mass index is 28.48 kg/(m^2). Tech Comments:  NA    Nuclear Med Study 1 or 2 day study: 1 day  Stress Test Type:  Stress  Reading MD: Charlton Haws, MD  Order Authorizing Provider:  J.Paz  Resting Radionuclide: Technetium 22m Tetrofosmin  Resting Radionuclide Dose: 11.0 mCi   Stress Radionuclide:  Technetium 52m Tetrofosmin  Stress Radionuclide Dose: 33.0 mCi           Stress Protocol Rest HR: 72 Stress HR: 153  Rest BP: 110/66 Stress BP: 130/57  Exercise Time (min): 9:00 METS: 10.40   Predicted Max HR: 173 bpm % Max HR: 88.44 bpm Rate Pressure Product: 21308   Dose of Adenosine (mg):  n/a Dose of Lexiscan: n/a mg  Dose of Atropine (mg): n/a Dose of Dobutamine: n/a mcg/kg/min (at max HR)  Stress Test Technologist: Milana Na, EMT-P  Nuclear Technologist:  Domenic Polite, CNMT     Rest Procedure:  Myocardial perfusion imaging was performed at rest 45 minutes following the intravenous administration of Technetium 67m Tetrofosmin. Rest ECG: NSR  Stress Procedure:  The patient exercised for 9:00.  The patient stopped due to fatigue and denied any chest pain.  There were non specific ST-T wave changes.  Technetium 76m Tetrofosmin was injected at peak exercise and myocardial  perfusion imaging was performed after a brief delay. Stress ECG: No significant change from baseline ECG  QPS Raw Data Images:  Normal; no motion artifact; normal heart/lung ratio. Stress Images:  Normal homogeneous uptake in all areas of the myocardium. Rest Images:  Normal homogeneous uptake in all areas of the myocardium. Subtraction (SDS):  Normal Transient Ischemic Dilatation (Normal <1.22):  0.95 Lung/Heart Ratio (Normal <0.45):  0.43  Quantitative Gated Spect Images QGS EDV:  117 ml QGS ESV:  51 ml QGS cine images:  NL LV Function; NL Wall Motion QGS EF: 56%  Impression Exercise Capacity:  Good exercise capacity. BP Response:  Blunted BP response Clinical Symptoms:  No chest pain. ECG Impression:  No significant ST segment change suggestive of ischemia. Comparison with Prior Nuclear Study: No images to compare  Overall Impression:  Normal stress nuclear study.   Charlton Haws

## 2011-12-11 ENCOUNTER — Ambulatory Visit (INDEPENDENT_AMBULATORY_CARE_PROVIDER_SITE_OTHER): Payer: BC Managed Care – PPO | Admitting: Internal Medicine

## 2011-12-11 ENCOUNTER — Encounter: Payer: Self-pay | Admitting: Internal Medicine

## 2011-12-11 VITALS — BP 106/72 | HR 78 | Temp 98.0°F | Ht 72.0 in | Wt 213.6 lb

## 2011-12-11 DIAGNOSIS — R918 Other nonspecific abnormal finding of lung field: Secondary | ICD-10-CM

## 2011-12-11 DIAGNOSIS — R0609 Other forms of dyspnea: Secondary | ICD-10-CM

## 2011-12-11 DIAGNOSIS — R0600 Dyspnea, unspecified: Secondary | ICD-10-CM

## 2011-12-11 DIAGNOSIS — R9389 Abnormal findings on diagnostic imaging of other specified body structures: Secondary | ICD-10-CM

## 2011-12-11 DIAGNOSIS — I10 Essential (primary) hypertension: Secondary | ICD-10-CM

## 2011-12-11 DIAGNOSIS — R0989 Other specified symptoms and signs involving the circulatory and respiratory systems: Secondary | ICD-10-CM

## 2011-12-11 MED ORDER — OLMESARTAN MEDOXOMIL 20 MG PO TABS: ORAL_TABLET | ORAL | Status: DC

## 2011-12-11 NOTE — Patient Instructions (Signed)
Stop lisnopril  Benicar 20 mg one half daily in place of lisinopril  Please schedule a follow up office visit in 6 weeks, call sooner if needed

## 2011-12-11 NOTE — Progress Notes (Signed)
  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 Says he can here himself intemittently wheezing also with variable doe x more than slow adls  Sleeping ok without nocturnal  or early am exacerbation  of respiratory  c/o's or need for noct saba. Also denies any obvious fluctuation of symptoms with weather or environmental changes or other aggravating or alleviating factors except as outlined above   PMHx Eval by Dr Maple Hudson in 1999 for med adenopathy  Review of Systems  Constitutional: Negative for fever, chills, activity change, appetite change and unexpected weight change.  HENT: Negative for congestion, sore throat, rhinorrhea, sneezing, trouble swallowing, dental problem, voice change and postnasal drip.   Eyes: Negative for visual disturbance.  Respiratory: Positive for cough and shortness of breath. Negative for choking.   Cardiovascular: Negative for chest pain and leg swelling.  Gastrointestinal: Negative for nausea, vomiting and abdominal pain.  Genitourinary: Negative for difficulty urinating.  Musculoskeletal: Negative for arthralgias.  Skin: Negative for rash.  Psychiatric/Behavioral: Negative for behavioral problems and confusion.       Objective:   Physical Exam amb bm with classic voice fatigue and pseudoweeze  Wt 213 12/11/2011  HEENT: nl dentition, turbinates, and orophanx. Nl external ear canals without cough reflex   NECK :  without JVD/Nodes/TM/ nl carotid upstrokes bilaterally   LUNGS: no acc muscle use, clear to A  and P bilaterally without cough on insp or exp maneuvers   CV:  RRR  no s3 or murmur or increase in P2, no edema   ABD:  soft and nontender with nl excursion in the supine position. No bruits or organomegaly, bowel sounds nl  MS:  warm without deformities, calf tenderness, cyanosis or clubbing  SKIN: warm and dry without lesions    NEURO:  alert, approp, no deficits   cxr 11/20/11 Right paratracheal soft tissue and right hilar prominence may  be due to adenopathy. Chest CT with contrast is recommended. These  results will be called to the ordering clinician or representative  by the Radiologist Assistant, and communication documented in the  PACS Dashboard.  2. Bibasilar linear densities with possible bronchiectasis,  suggesting chronicity     Assessment & Plan:

## 2011-12-12 DIAGNOSIS — I10 Essential (primary) hypertension: Secondary | ICD-10-CM | POA: Insufficient documentation

## 2011-12-12 DIAGNOSIS — R9389 Abnormal findings on diagnostic imaging of other specified body structures: Secondary | ICD-10-CM | POA: Insufficient documentation

## 2011-12-12 NOTE — Assessment & Plan Note (Addendum)
Symptoms are markedly disproportionate to objective findings and not clear this is a lung problem but pt does appear to have difficult airway management issues.  DDX of  difficult airways managment all start with A and  include Adherence, Ace Inhibitors, Acid Reflux, Active Sinus Disease, Alpha 1 Antitripsin deficiency, Anxiety masquerading as Airways dz,  ABPA,  allergy(esp in young), Aspiration (esp in elderly), Adverse effects of DPI,  Active smokers, plus two Bs  = Bronchiectasis and Beta blocker use..and one C= CHF   ACE I common cause of this syndrome and worth trying off then regroup to work through the rest of the ddx.  See instructions for specific recommendations which were reviewed directly with the patient who was given a copy with highlighter outlining the key components.

## 2011-12-12 NOTE — Assessment & Plan Note (Signed)
ACE inhibitors are problematic in  pts with airway complaints because  even experienced pulmonologists can't always distinguish ace effects from copd/asthma.  By themselves they don't actually cause a problem, much like oxygen can't by itself start a fire, but they certainly serve as a powerful catalyst or enhancer for any "fire"  or inflammatory process in the upper airway, be it caused by an ET  tube or more commonly reflux (especially in the obese or pts with known GERD or who are on biphoshonates).    In the era of ARB near equivalency until we have a better handle on the reversibility of the airway problem, it just makes sense to avoid ACEI  entirely in the short run and then decide later, having established a level of airway control using a reasonable limited regimen, whether to add back ace but even then being very careful to observe the pt for worsening airway control and number of meds used/ needed to control symptoms.    Try benicar 20 mg one half daily then regroup.

## 2011-12-12 NOTE — Assessment & Plan Note (Addendum)
Strongly suspect asymptomatic sarcoidosis, will check old records if available

## 2011-12-17 ENCOUNTER — Encounter: Payer: Self-pay | Admitting: Internal Medicine

## 2012-01-05 ENCOUNTER — Encounter: Payer: Self-pay | Admitting: Endocrinology

## 2012-01-05 ENCOUNTER — Other Ambulatory Visit (INDEPENDENT_AMBULATORY_CARE_PROVIDER_SITE_OTHER): Payer: BC Managed Care – PPO

## 2012-01-05 ENCOUNTER — Ambulatory Visit (INDEPENDENT_AMBULATORY_CARE_PROVIDER_SITE_OTHER): Payer: BC Managed Care – PPO | Admitting: Endocrinology

## 2012-01-05 VITALS — BP 110/72 | HR 95 | Temp 98.2°F | Ht 72.0 in | Wt 209.8 lb

## 2012-01-05 DIAGNOSIS — E119 Type 2 diabetes mellitus without complications: Secondary | ICD-10-CM

## 2012-01-05 LAB — HEMOGLOBIN A1C: Hgb A1c MFr Bld: 9.8 % — ABNORMAL HIGH (ref 4.6–6.5)

## 2012-01-05 NOTE — Patient Instructions (Addendum)
blood tests are being ordered for you today.  please call [phone] to hear your test results.  You will be prompted to enter the 9-digit "MRN" number that appears at the top left of this page, followed by #.  Then you will hear the message. pending the test results, please increase the insulin to 40 units with breakfast, and 15 with the evening meal Please make a follow-up appointment in 3 months. check your blood sugar 2 times a day.  vary the time of day when you check, between before the 3 meals, and at bedtime.  also check if you have symptoms of your blood sugar being too high or too low.  please keep a record of the readings and bring it to your next appointment here.  please call us sooner if you are having low blood sugar episodes.   (update: i left message on phone-tree:  rx as we discussed)

## 2012-01-05 NOTE — Progress Notes (Signed)
  Subjective:    Patient ID: Wayne Webb, male    DOB: [birth year], 46 y.o.   MRN: [id number]  HPI Pt returns for f/u of insulin-requiring DM (2004). no cbg record, but states cbg's vary from 170-540.  It is highest at hs, and lowest in am.   Past Medical History  Diagnosis Date  . HYPERLIPIDEMIA 03/15/2007  . GOITER, MULTINODULAR 03/21/2010  . DIABETES MELLITUS, TYPE II 03/15/2007  . ERECTILE DYSFUNCTION 03/15/2007  . Diplopia 12/24/2009  . ALLERGIC RHINITIS 03/15/2007    No past surgical history on file.  History   Social History  . Marital Status: Married    Spouse Name: N/A    Number of Children: 1  . Years of Education: N/A   Occupational History  .  Wellsite geologist  .     Social History Main Topics  . Smoking status: Former Smoker -- 0.5 packs/day for 10 years    Types: Cigarettes    Quit date: 10/26/1993  . Smokeless tobacco: Never Used  . Alcohol Use: Yes     rarely  . Drug Use: No  . Sexually Active: Not on file   Other Topics Concern  . Not on file   Social History Narrative   Divorced. Engaged to be remarriedWorks at [employer] devicesExercise ~1 x/ weekDiet-does watch    Current Outpatient Prescriptions on File Prior to Visit  Medication Sig Dispense Refill  . Cinnamon 500 MG capsule Take 500 mg by mouth daily.        . fluticasone (FLONASE) 50 MCG/ACT nasal spray Place 1 spray into the nose daily.  16 g  11  . glucose blood (ONE TOUCH ULTRA TEST) test strip Use as instructed twice daily to check blood sugar  180 each  3  . Insulin Pen Needle 31G X 5 MM MISC Use as directed twice daily  180 each  3  . olmesartan (BENICAR) 20 MG tablet One half daily  30 tablet  11  . ONETOUCH DELICA LANCETS MISC USE 3 TIMES DAILY FOR CHECKING BLOOD SUGARS  100 each  3  . vardenafil (LEVITRA) 20 MG tablet Take 20 mg by mouth as needed.          No Known Allergies  Family History  Problem Relation Age of Onset  . Heart disease Mother    CAD-CABG-onset in her early 33's  . Diabetes Mother 48    had DM from her 33's  . Hypertension Mother   . Cancer Father     Prostate Cancer  . Stroke Neg Hx     BP 110/72  Pulse 95  Temp(Src) 98.2 F (36.8 C) (Oral)  Ht 6' (1.829 m)  Wt 209 lb 12.8 oz (95.165 kg)  BMI 28.45 kg/m2  SpO2 95%    Review of Systems denies hypoglycemia    Objective:   Physical Exam VITAL SIGNS:  See vs page GENERAL: no distress PSYCH: Alert and oriented x 3.  Does not appear anxious nor depressed.  Lab Results  Component Value Date   HGBA1C 9.8* 01/05/2012      Assessment & Plan:  DM, needs increased rx

## 2012-01-06 ENCOUNTER — Ambulatory Visit: Payer: BC Managed Care – PPO | Admitting: Endocrinology

## 2012-01-20 ENCOUNTER — Other Ambulatory Visit: Payer: Self-pay | Admitting: Endocrinology

## 2012-01-27 ENCOUNTER — Ambulatory Visit: Payer: BC Managed Care – PPO | Admitting: Internal Medicine

## 2012-02-18 ENCOUNTER — Ambulatory Visit: Payer: BC Managed Care – PPO | Admitting: Internal Medicine

## 2012-02-24 ENCOUNTER — Ambulatory Visit: Payer: BC Managed Care – PPO | Admitting: Internal Medicine

## 2012-04-06 ENCOUNTER — Other Ambulatory Visit (INDEPENDENT_AMBULATORY_CARE_PROVIDER_SITE_OTHER): Payer: BC Managed Care – PPO

## 2012-04-06 ENCOUNTER — Encounter: Payer: Self-pay | Admitting: Endocrinology

## 2012-04-06 ENCOUNTER — Ambulatory Visit (INDEPENDENT_AMBULATORY_CARE_PROVIDER_SITE_OTHER): Payer: BC Managed Care – PPO | Admitting: Endocrinology

## 2012-04-06 VITALS — BP 108/70 | HR 84 | Temp 98.1°F | Ht 72.0 in | Wt 214.0 lb

## 2012-04-06 DIAGNOSIS — E119 Type 2 diabetes mellitus without complications: Secondary | ICD-10-CM

## 2012-04-06 LAB — HEMOGLOBIN A1C: Hgb A1c MFr Bld: 6.7 % — ABNORMAL HIGH (ref 4.6–6.5)

## 2012-04-06 NOTE — Progress Notes (Signed)
Subjective:    Patient ID: Wayne Webb, male    DOB: 04/22/1966, 46 y.o.   MRN: [id number]  HPI Pt returns for f/u of insulin-requiring DM (dx'ed 2004; no known complications).  pt states he feels well in general.  no cbg record, but states cbg's are well-controlled.  There is no trend throughout the day.  He says he sometimes takes less than the prescribed amount of insulin.  Past Medical History  Diagnosis Date  . HYPERLIPIDEMIA 03/15/2007  . GOITER, MULTINODULAR 03/21/2010  . DIABETES MELLITUS, TYPE II 03/15/2007  . ERECTILE DYSFUNCTION 03/15/2007  . Diplopia 12/24/2009  . ALLERGIC RHINITIS 03/15/2007    No past surgical history on file.  History   Social History  . Marital Status: Married    Spouse Name: N/A    Number of Children: 1  . Years of Education: N/A   Occupational History  .  Wellsite geologist  .     Social History Main Topics  . Smoking status: Former Smoker -- 0.5 packs/day for 10 years    Types: Cigarettes    Quit date: 10/26/1993  . Smokeless tobacco: Never Used  . Alcohol Use: Yes     rarely  . Drug Use: No  . Sexually Active: Not on file   Other Topics Concern  . Not on file   Social History Narrative   Divorced. Engaged to be remarriedWorks at [employer] devicesExercise ~1 x/ weekDiet-does watch    Current Outpatient Prescriptions on File Prior to Visit  Medication Sig Dispense Refill  . Cinnamon 500 MG capsule Take 500 mg by mouth daily.        . fluticasone (FLONASE) 50 MCG/ACT nasal spray Place 1 spray into the nose daily.  16 g  11  . glucose blood (ONE TOUCH ULTRA TEST) test strip Use as instructed twice daily to check blood sugar  180 each  3  . insulin lispro protamine-insulin lispro (HUMALOG 75/25) (75-25) 100 UNIT/ML SUSP Inject into the skin 40 units with breakfast and 15 units with the evening meal      . Insulin Pen Needle 31G X 5 MM MISC Use as directed twice daily  180 each  3  . LEVITRA 20 MG tablet TAKE AS  NEEDED AS DIRECTED  10 tablet  4  . Multiple Vitamin (MULTIVITAMIN) tablet Take 1 tablet by mouth daily.      Marland Kitchen olmesartan (BENICAR) 20 MG tablet One half daily  30 tablet  11  . ONETOUCH DELICA LANCETS MISC USE 3 TIMES DAILY FOR CHECKING BLOOD SUGARS  100 each  3    No Known Allergies  Family History  Problem Relation Age of Onset  . Heart disease Mother     CAD-CABG-onset in her early 32's  . Diabetes Mother 80    had DM from her 78's  . Hypertension Mother   . Cancer Father     Prostate Cancer  . Stroke Neg Hx     BP 108/70  Pulse 84  Temp 98.1 F (36.7 C) (Oral)  Ht 6' (1.829 m)  Wt 214 lb (97.07 kg)  BMI 29.02 kg/m2  SpO2 96%  Review of Systems denies hypoglycemia.    Objective:   Physical Exam VITAL SIGNS:  See vs page GENERAL: no distress. Pulses: dorsalis pedis intact bilat.   Feet: no deformity.  no ulcer on the feet.  feet are of normal color and temp.  no edema Neuro: sensation is  intact to touch on the feet  Lab Results  Component Value Date   HGBA1C 6.7* 04/06/2012      Assessment & Plan:  DM.  As he has been taking less than the prescribed insulin dosage, we should reduce the prescription.

## 2012-04-06 NOTE — Patient Instructions (Addendum)
blood tests are being requested for you today.  You will receive a letter with results. pending the test results, please continue the same insulin: 40 units with breakfast, and 15 with the evening meal Please make a follow-up appointment in 3 months. check your blood sugar 2 times a day.  vary the time of day when you check, between before the 3 meals, and at bedtime.  also check if you have symptoms of your blood sugar being too high or too low.  please keep a record of the readings and bring it to your next appointment here.  please call us sooner if you are having low blood sugar episodes.

## 2012-04-07 ENCOUNTER — Telehealth: Payer: Self-pay | Admitting: *Deleted

## 2012-04-07 NOTE — Telephone Encounter (Signed)
Called pt to inform of lab results, left message for pt to callback office (letter also mailed to pt). 

## 2012-04-08 NOTE — Telephone Encounter (Signed)
Pt advised of letter and results.

## 2012-07-05 ENCOUNTER — Ambulatory Visit: Payer: BC Managed Care – PPO | Admitting: Endocrinology

## 2012-07-06 ENCOUNTER — Ambulatory Visit: Payer: BC Managed Care – PPO | Admitting: Endocrinology

## 2012-07-08 ENCOUNTER — Encounter: Payer: Self-pay | Admitting: Endocrinology

## 2012-07-08 ENCOUNTER — Ambulatory Visit (INDEPENDENT_AMBULATORY_CARE_PROVIDER_SITE_OTHER): Payer: BC Managed Care – PPO | Admitting: Endocrinology

## 2012-07-08 ENCOUNTER — Other Ambulatory Visit (INDEPENDENT_AMBULATORY_CARE_PROVIDER_SITE_OTHER): Payer: BC Managed Care – PPO

## 2012-07-08 VITALS — BP 102/78 | HR 88 | Temp 98.1°F | Ht 71.0 in | Wt 215.0 lb

## 2012-07-08 DIAGNOSIS — E119 Type 2 diabetes mellitus without complications: Secondary | ICD-10-CM

## 2012-07-08 LAB — HEMOGLOBIN A1C: Hgb A1c MFr Bld: 5.8 % (ref 4.6–6.5)

## 2012-07-08 NOTE — Patient Instructions (Addendum)
blood tests are being requested for you today.  You will receive a letter with results.  pending the test results, please continue to take insulin: 35 units with breakfast, and 20 with the evening meal Please make a follow-up appointment in 3 months.  check your blood sugar 2 times a day.  vary the time of day when you check, between before the 3 meals, and at bedtime.  also check if you have symptoms of your blood sugar being too high or too low.  please keep a record of the readings and bring it to your next appointment here.  please call us sooner if you are having low blood sugar episodes.

## 2012-07-08 NOTE — Progress Notes (Signed)
Subjective:    Patient ID: Wayne Webb, male    DOB: 1966/10/05, 46 y.o.   MRN: [id number]  HPI Pt returns for f/u of insulin-requiring DM (dx'ed 2004; no known complications; he has done better with a simpler bid insulin regimen).  pt states he feels well in general.  no cbg record, but states he has to take less than the prescribed amount in am to avoid hypoglycemia, and slightly more with the evening meal.   Past Medical History  Diagnosis Date  . HYPERLIPIDEMIA 03/15/2007  . GOITER, MULTINODULAR 03/21/2010  . DIABETES MELLITUS, TYPE II 03/15/2007  . ERECTILE DYSFUNCTION 03/15/2007  . Diplopia 12/24/2009  . ALLERGIC RHINITIS 03/15/2007    No past surgical history on file.  History   Social History  . Marital Status: Married    Spouse Name: N/A    Number of Children: 1  . Years of Education: N/A   Occupational History  .  Wellsite geologist  .     Social History Main Topics  . Smoking status: Former Smoker -- 0.5 packs/day for 10 years    Types: Cigarettes    Quit date: 10/26/1993  . Smokeless tobacco: Never Used  . Alcohol Use: Yes     rarely  . Drug Use: No  . Sexually Active: Not on file   Other Topics Concern  . Not on file   Social History Narrative   Divorced. Engaged to be remarriedWorks at [employer] devicesExercise ~1 x/ weekDiet-does watch    Current Outpatient Prescriptions on File Prior to Visit  Medication Sig Dispense Refill  . AMBULATORY NON FORMULARY MEDICATION Diabetes Nutrition packet daily      . AMBULATORY NON FORMULARY MEDICATION Nitropeak Protein as needed      . Cinnamon 500 MG capsule Take 500 mg by mouth daily.        . fluticasone (FLONASE) 50 MCG/ACT nasal spray Place 1 spray into the nose daily.  16 g  11  . glucose blood (ONE TOUCH ULTRA TEST) test strip Use as instructed twice daily to check blood sugar  180 each  3  . insulin lispro protamine-insulin lispro (HUMALOG 75/25) (75-25) 100 UNIT/ML SUSP Inject into the  skin 35 units with breakfast and 20 units with the evening meal      . Insulin Pen Needle 31G X 5 MM MISC Use as directed twice daily  180 each  3  . LEVITRA 20 MG tablet TAKE AS NEEDED AS DIRECTED  10 tablet  4  . Multiple Vitamin (MULTIVITAMIN) tablet Take 1 tablet by mouth daily.      Marland Kitchen olmesartan (BENICAR) 20 MG tablet One half daily  30 tablet  11  . ONETOUCH DELICA LANCETS MISC USE 3 TIMES DAILY FOR CHECKING BLOOD SUGARS  100 each  3    No Known Allergies  Family History  Problem Relation Age of Onset  . Heart disease Mother     CAD-CABG-onset in her early 7's  . Diabetes Mother 84    had DM from her 71's  . Hypertension Mother   . Cancer Father     Prostate Cancer  . Stroke Neg Hx     BP 102/78  Pulse 88  Temp 98.1 F (36.7 C) (Oral)  Ht 5\' 11"  (1.803 m)  Wt 215 lb (97.523 kg)  BMI 29.99 kg/m2  SpO2 95%  Review of Systems denies LOC    Objective:   Physical Exam Pulses: dorsalis  pedis intact bilat.   Feet: no deformity.  no ulcer on the feet.  feet are of normal color and temp.  no edema Neuro: sensation is intact to touch on the feet   Lab Results  Component Value Date   HGBA1C 5.8 07/08/2012      Assessment & Plan:  DM is overcontrolled, especially given this insulin regimen, which does match insulin to his changing needs throughout the day

## 2012-07-25 ENCOUNTER — Telehealth: Payer: Self-pay | Admitting: Endocrinology

## 2012-07-25 NOTE — Telephone Encounter (Signed)
LMOVM for Pt to return call.  

## 2012-07-25 NOTE — Telephone Encounter (Signed)
Pt called for test results from previous appointment.

## 2012-08-03 ENCOUNTER — Other Ambulatory Visit: Payer: Self-pay | Admitting: Endocrinology

## 2012-10-03 ENCOUNTER — Other Ambulatory Visit: Payer: Self-pay | Admitting: Endocrinology

## 2012-10-03 ENCOUNTER — Other Ambulatory Visit: Payer: Self-pay | Admitting: Internal Medicine

## 2012-10-03 NOTE — Telephone Encounter (Signed)
Refill done.  

## 2012-10-07 ENCOUNTER — Ambulatory Visit: Payer: BC Managed Care – PPO | Admitting: Endocrinology

## 2012-10-14 ENCOUNTER — Telehealth: Payer: Self-pay

## 2012-10-14 ENCOUNTER — Telehealth: Payer: Self-pay | Admitting: Endocrinology

## 2012-10-14 NOTE — Telephone Encounter (Signed)
Patient is requesting a call back from [facility] to discuss his blood sugar

## 2012-10-14 NOTE — Telephone Encounter (Signed)
Pt called stating his blood sugar has been running around 250-300 this am blood sugar was 360  Las check at 12 noon 311 what to do?

## 2012-10-14 NOTE — Telephone Encounter (Signed)
Called pt: he started to eat more after Thanksgiving and noticed his sugars increasing about 3 weeks ago; 180-190s in am, and increasing to 200's in pm. Today they were higher: woke up at 360, had his insulin, but was still high at noon: 311. Upon questioning, he did have pizza last night (explained that can raise his sugars well into the next day). No lows. He has hypoglycemia awareness.  He takes 75/25 insulin 30 units in am and 15 units with dinner, decreased at last visit with Dr. Everardo All in 06/2012 from 35 and 20, respectively, as his HbA1C was <6%.  I advised him to increase am insulin to 35 and keep the evening insulin dose the same, but if sugars in am not better in next week, to increase evening insulin to 22. Advised Re: sick day rules - he did have a cold, but now resolved. He should call me with any Q or concerns. He agrees to plan. Has appt with Dr. Everardo All at the beginning of the year.

## 2012-10-26 HISTORY — PX: LASIK: SHX215

## 2012-11-03 ENCOUNTER — Ambulatory Visit (INDEPENDENT_AMBULATORY_CARE_PROVIDER_SITE_OTHER): Payer: BC Managed Care – PPO | Admitting: Endocrinology

## 2012-11-03 VITALS — BP 126/76 | HR 89 | Temp 98.8°F | Wt 214.0 lb

## 2012-11-03 DIAGNOSIS — E119 Type 2 diabetes mellitus without complications: Secondary | ICD-10-CM

## 2012-11-03 LAB — HEMOGLOBIN A1C
Hgb A1c MFr Bld: 10.1 % — ABNORMAL HIGH (ref <5.7)
Mean Plasma Glucose: 243 mg/dL — ABNORMAL HIGH (ref <117)

## 2012-11-03 NOTE — Progress Notes (Signed)
Subjective:    Patient ID: Wayne Webb, male    DOB: August 23, 1966, 47 y.o.   MRN: [id number]  HPI Pt returns for f/u of insulin-requiring DM (dx'ed 2004; no known complications; he has done better with a simpler bid insulin regimen).  pt states he feels well in general.  He increased his insulin to 35 units qam and 18 qpm, due to hyperglycemia.  On this, cbg's have improved only to the 200's.  There is no trend throughout the day.   Past Medical History  Diagnosis Date  . HYPERLIPIDEMIA 03/15/2007  . GOITER, MULTINODULAR 03/21/2010  . DIABETES MELLITUS, TYPE II 03/15/2007  . ERECTILE DYSFUNCTION 03/15/2007  . Diplopia 12/24/2009  . ALLERGIC RHINITIS 03/15/2007    No past surgical history on file.  History   Social History  . Marital Status: Married    Spouse Name: N/A    Number of Children: 1  . Years of Education: N/A   Occupational History  .  Wellsite geologist  .     Social History Main Topics  . Smoking status: Former Smoker -- 0.5 packs/day for 10 years    Types: Cigarettes    Quit date: 10/26/1993  . Smokeless tobacco: Never Used  . Alcohol Use: Yes     Comment: rarely  . Drug Use: No  . Sexually Active: Not on file   Other Topics Concern  . Not on file   Social History Narrative   Divorced. Engaged to be remarriedWorks at [employer] devicesExercise ~1 x/ weekDiet-does watch    Current Outpatient Prescriptions on File Prior to Visit  Medication Sig Dispense Refill  . ACCU-CHEK ACTIVE STRIPS test strip TEST BLOOD SUGAR THREE TIMES DAILY  100 each  6  . AMBULATORY NON FORMULARY MEDICATION Diabetes Nutrition packet daily      . AMBULATORY NON FORMULARY MEDICATION Nitropeak Protein as needed      . B-D ULTRAFINE III SHORT PEN 31G X 8 MM MISC USE AS DIRECTED WITH PEN NEEDLE TWICE DAILY  100 each  0  . Cinnamon 500 MG capsule Take 500 mg by mouth daily.        . fluticasone (FLONASE) 50 MCG/ACT nasal spray USE 1 SPRAY IN EACH NOSTRIL DAILY  16 g  10   . glucose blood (ONE TOUCH ULTRA TEST) test strip Use as instructed twice daily to check blood sugar  180 each  3  . Insulin Pen Needle 31G X 5 MM MISC Use as directed twice daily  180 each  3  . LEVITRA 20 MG tablet TAKE AS NEEDED AS DIRECTED  10 tablet  4  . Multiple Vitamin (MULTIVITAMIN) tablet Take 1 tablet by mouth daily.      Marland Kitchen olmesartan (BENICAR) 20 MG tablet One half daily  30 tablet  11  . ONETOUCH DELICA LANCETS MISC USE THREE TIMES DAILY TO CHECK BLOOD SUGAR  100 each  6    No Known Allergies  Family History  Problem Relation Age of Onset  . Heart disease Mother     CAD-CABG-onset in her early 15's  . Diabetes Mother 48    had DM from her 47's  . Hypertension Mother   . Cancer Father     Prostate Cancer  . Stroke Neg Hx     BP 126/76  Pulse 89  Temp 98.8 F (37.1 C) (Oral)  Wt 214 lb (97.07 kg)  SpO2 95%  Review of Systems denies hypoglycemia.  Objective:   Physical Exam VITAL SIGNS:  See vs page GENERAL: no distress SKIN:  Insulin injection sites at the anterior abdomen are normal.     Assessment & Plan:  DM, with apparent worsening of glycemic control

## 2012-11-03 NOTE — Patient Instructions (Addendum)
blood tests are being requested for you today.  You will receive a letter with results.  pending the test results, please continue to take insulin: 35 units with breakfast, and 18 with the evening meal Please make a follow-up appointment in 3 months.  check your blood sugar 2 times a day.  vary the time of day when you check, between before the 3 meals, and at bedtime.  also check if you have symptoms of your blood sugar being too high or too low.  please keep a record of the readings and bring it to your next appointment here.  please call us sooner if you are having low blood sugar episodes.

## 2012-11-04 LAB — MICROALBUMIN / CREATININE URINE RATIO
Creatinine, Urine: 214.4 mg/dL
Microalb Creat Ratio: 121.7 mg/g — ABNORMAL HIGH (ref 0.0–30.0)
Microalb, Ur: 26.1 mg/dL — ABNORMAL HIGH (ref 0.00–1.89)

## 2012-11-23 ENCOUNTER — Encounter: Payer: BC Managed Care – PPO | Admitting: Internal Medicine

## 2012-11-28 ENCOUNTER — Telehealth: Payer: Self-pay | Admitting: Endocrinology

## 2012-11-28 NOTE — Telephone Encounter (Signed)
The patient called to report that his rx expired and he needs a refill for humalog.  The patient may be reached at [phone] if needed.

## 2012-11-29 ENCOUNTER — Other Ambulatory Visit: Payer: Self-pay

## 2012-11-29 MED ORDER — INSULIN LISPRO PROT & LISPRO (75-25 MIX) 100 UNIT/ML ~~LOC~~ SUSP: 38.0000 [IU] | Freq: Every day | SUBCUTANEOUS | Status: DC

## 2012-11-29 NOTE — Telephone Encounter (Signed)
Spoke with pt he takes 38 units humalog with breakfast and 18 units with evening meal, new rx sent to [facility]

## 2012-12-02 ENCOUNTER — Ambulatory Visit (INDEPENDENT_AMBULATORY_CARE_PROVIDER_SITE_OTHER): Payer: BC Managed Care – PPO | Admitting: Internal Medicine

## 2012-12-02 ENCOUNTER — Encounter: Payer: Self-pay | Admitting: Internal Medicine

## 2012-12-02 VITALS — BP 120/84 | HR 65 | Temp 98.0°F | Ht 72.0 in | Wt 213.0 lb

## 2012-12-02 DIAGNOSIS — R0600 Dyspnea, unspecified: Secondary | ICD-10-CM

## 2012-12-02 DIAGNOSIS — Z0000 Encounter for general adult medical examination without abnormal findings: Secondary | ICD-10-CM

## 2012-12-02 DIAGNOSIS — I10 Essential (primary) hypertension: Secondary | ICD-10-CM

## 2012-12-02 LAB — CBC WITH DIFFERENTIAL/PLATELET
Basophils Absolute: 0.1 10*3/uL (ref 0.0–0.1)
Basophils Relative: 1 % (ref 0.0–3.0)
Eosinophils Absolute: 0.1 10*3/uL (ref 0.0–0.7)
Eosinophils Relative: 6.2 % — ABNORMAL HIGH (ref 0.0–5.0)
HCT: 41.9 % (ref 39.0–52.0)
Hemoglobin: 14 g/dL (ref 13.0–17.0)
Lymphocytes Relative: 22.1 % (ref 12.0–46.0)
Lymphs Abs: 0.8 10*3/uL (ref 0.7–4.0)
MCHC: 33.5 g/dL (ref 30.0–36.0)
MCV: 87 fl (ref 78.0–100.0)
Monocytes Absolute: 0.7 10*3/uL (ref 0.1–1.0)
Monocytes Relative: 18.7 % — ABNORMAL HIGH (ref 3.0–12.0)
Neutro Abs: 1.8 10*3/uL (ref 1.4–7.7)
Neutrophils Relative %: 52 % (ref 43.0–77.0)
Platelets: 212 10*3/uL (ref 150.0–400.0)
RBC: 4.81 Mil/uL (ref 4.22–5.81)
RDW: 12.5 % (ref 11.5–14.6)
WBC: 3.5 10*3/uL — ABNORMAL LOW (ref 4.5–10.5)

## 2012-12-02 LAB — LIPID PANEL
Cholesterol: 191 mg/dL (ref 0–200)
HDL: 40.2 mg/dL (ref >39.00)
LDL Cholesterol: 127 mg/dL — ABNORMAL HIGH (ref 0–99)
Total CHOL/HDL Ratio: 5
Triglycerides: 117 mg/dL (ref 0.0–149.0)
VLDL: 23.4 mg/dL (ref 0.0–40.0)

## 2012-12-02 LAB — COMPREHENSIVE METABOLIC PANEL
ALT: 56 U/L — ABNORMAL HIGH (ref 0–53)
AST: 50 U/L — ABNORMAL HIGH (ref 0–37)
Albumin: 4.1 g/dL (ref 3.5–5.2)
Alkaline Phosphatase: 115 U/L (ref 39–117)
BUN: 22 mg/dL (ref 6–23)
CO2: 30 mEq/L (ref 19–32)
Calcium: 9.8 mg/dL (ref 8.4–10.5)
Chloride: 103 mEq/L (ref 96–112)
Creatinine, Ser: 1.3 mg/dL (ref 0.4–1.5)
GFR: 77.59 mL/min (ref >60.00)
Glucose, Bld: 110 mg/dL — ABNORMAL HIGH (ref 70–99)
Potassium: 3.8 mEq/L (ref 3.5–5.1)
Sodium: 140 mEq/L (ref 135–145)
Total Bilirubin: 1.6 mg/dL — ABNORMAL HIGH (ref 0.3–1.2)
Total Protein: 8.3 g/dL (ref 6.0–8.3)

## 2012-12-02 LAB — PSA: PSA: 1.5 ng/mL (ref 0.10–4.00)

## 2012-12-02 MED ORDER — LOSARTAN POTASSIUM 50 MG PO TABS: 50.0000 mg | ORAL_TABLET | Freq: Every day | ORAL | Status: DC

## 2012-12-02 NOTE — Progress Notes (Signed)
  Subjective:    Patient ID: Wayne Webb, male    DOB: 19-Sep-1966, 47 y.o.   MRN: [id number]  HPI CPX  Past Medical History  Diagnosis Date  . HYPERLIPIDEMIA 03/15/2007  . GOITER, MULTINODULAR 03/21/2010  . DIABETES MELLITUS, TYPE II 03/15/2007  . ERECTILE DYSFUNCTION 03/15/2007  . Diplopia 12/24/2009  . ALLERGIC RHINITIS 03/15/2007  . CRI (chronic renal insufficiency)     CRI, creat 1.4 , renal u/s 04-2011 neg   Past Surgical History  Procedure Date  . Lasik 10-2012   History   Social History  . Marital Status: Married    Spouse Name: N/A    Number of Children: 1  . Years of Education: N/A   Occupational History  . active job Wellsite geologist   Social History Main Topics  . Smoking status: Former Smoker -- 0.5 packs/day for 10 years    Types: Cigarettes    Quit date: 10/26/1993  . Smokeless tobacco: Never Used  . Alcohol Use: Yes     Comment: rarely  . Drug Use: No  . Sexually Active: Not on file   Other Topics Concern  . Not on file   Social History Narrative   Divorced. Engaged to be remarried ---Exercise:  ~ 2 x/ week, goes to the gymDiet: very poor in December, going back to a more healthy  diet   Family History  Problem Relation Age of Onset  . Heart disease Mother     CAD-CABG-onset in her early 58's  . Diabetes Mother 26    had DM from her 53's  . Hypertension Mother   . Prostate cancer Father     dx in his 15s  . Stroke Neg Hx   . Colon cancer Neg Hx       Review of Systems In general doing well. Benicar was discontinued last year because he was having dyspnea on exertion and cough. This DOE is gone, he goes to the gym without any problems, he still has some cough in the morning with some sputum production but overall symptoms are less than last year. No chest pain or shortness of breath No nausea, vomiting, diarrhea. No dysuria gross hematuria. No anxiety-depression.     Objective:   Physical Exam General -- alert,  well-developed, and well-nourished.   Neck --normal carotid pulse Lungs -- normal respiratory effort, no intercostal retractions, no accessory muscle use, and normal breath sounds.   Heart-- normal rate, regular rhythm, no murmur, and no gallop.   Abdomen--soft, non-tender, no distention, no masses, no HSM, no guarding, and no rigidity.   Extremities-- no pretibial edema bilaterally Rectal-- No external abnormalities noted. Normal sphincter tone. No rectal masses or tenderness. Brown stool  Prostate:  Prostate gland firm and smooth, no enlargement, nodularity, tenderness, mass, asymmetry or induration. Neurologic-- alert & oriented X3 and strength normal in all extremities. Psych-- Cognition and judgment appear intact. Alert and cooperative with normal attention span and concentration.  not anxious appearing and not depressed appearing.       Assessment & Plan:    Needs a letter with DOB, see for 10 years, address--- mail to po box

## 2012-12-02 NOTE — Assessment & Plan Note (Addendum)
BP well-controlled without benicar. He has diabetes, hypertension and a positive microalbumin. Plan: Restart ARBs ---> losartan 50 mg ARB benefit >> risk. If he develops  severe cough or airwayt issues, we'll have to stop ARBs. BMP in 3 weeks

## 2012-12-02 NOTE — Patient Instructions (Addendum)
Start losartan. Come back in 3 weeks for labs only: BMP-- dx  hypertension Next visit in 4 months.

## 2012-12-02 NOTE — Assessment & Plan Note (Addendum)
Td 2006 pneumonia shot 2007 flu shot  @ his job  last PSA normal 10-2010 Given family history of prostate cancer , will check a PSA today. After age 47 needs a screen yearly. he never had a colonoscopy  diet -exercise!! Also needs a letter that he has been a pt here x years.

## 2012-12-02 NOTE — Assessment & Plan Note (Signed)
Stress test negative 11/2011. Was seen by pulmonary--->  discontinue Benicar,  At this point, the DOE improved/resolved, he also had cough and that has improved. Plan: Reintroduce ARBs, see HTN

## 2012-12-03 ENCOUNTER — Encounter: Payer: Self-pay | Admitting: Internal Medicine

## 2012-12-05 ENCOUNTER — Encounter: Payer: Self-pay | Admitting: *Deleted

## 2012-12-05 ENCOUNTER — Telehealth: Payer: Self-pay | Admitting: Internal Medicine

## 2012-12-05 NOTE — Telephone Encounter (Addendum)
Advise patient PSA (prostate test) is satisfactory. Liver tests continue to be elevated, add or redraw the following (dx elevated LFTs): --Hepatitis C --Anti Hep B c --hepatitis B surface antigen --Iron-ferritin --U/s abdomen Cholesterol is moderately elevated but before we treat Wayne Webb with cholesterol medication we need to w/u his liver abnormality.

## 2012-12-06 ENCOUNTER — Encounter: Payer: Self-pay | Admitting: *Deleted

## 2012-12-06 NOTE — Telephone Encounter (Signed)
The pt has to come back in to have blood re-drawn. We do not have a phone number on file for the pt so a letter has been mailed.

## 2012-12-23 ENCOUNTER — Other Ambulatory Visit (INDEPENDENT_AMBULATORY_CARE_PROVIDER_SITE_OTHER): Payer: BC Managed Care – PPO

## 2012-12-23 DIAGNOSIS — R7401 Elevation of levels of liver transaminase levels: Secondary | ICD-10-CM

## 2012-12-23 DIAGNOSIS — R748 Abnormal levels of other serum enzymes: Secondary | ICD-10-CM

## 2012-12-23 DIAGNOSIS — R7402 Elevation of levels of lactic acid dehydrogenase (LDH): Secondary | ICD-10-CM

## 2012-12-23 LAB — HEPATITIS C ANTIBODY: HCV Ab: NEGATIVE

## 2012-12-23 LAB — FERRITIN: Ferritin: 482.5 ng/mL — ABNORMAL HIGH (ref 22.0–322.0)

## 2012-12-23 LAB — HEPATITIS B SURFACE ANTIGEN: Hepatitis B Surface Ag: NEGATIVE

## 2012-12-24 LAB — HEPATITIS B CORE ANTIBODY, TOTAL: Hep B Core Total Ab: NEGATIVE

## 2012-12-27 ENCOUNTER — Encounter: Payer: Self-pay | Admitting: *Deleted

## 2013-01-10 ENCOUNTER — Telehealth: Payer: Self-pay | Admitting: Internal Medicine

## 2013-01-10 NOTE — Telephone Encounter (Signed)
Recently restarted losartan, please schedule a BMP, DX hypertension

## 2013-01-12 NOTE — Telephone Encounter (Signed)
Pt scheduled  

## 2013-01-13 NOTE — Telephone Encounter (Signed)
thx

## 2013-01-16 ENCOUNTER — Other Ambulatory Visit (INDEPENDENT_AMBULATORY_CARE_PROVIDER_SITE_OTHER): Payer: BC Managed Care – PPO

## 2013-01-16 DIAGNOSIS — I10 Essential (primary) hypertension: Secondary | ICD-10-CM

## 2013-01-16 LAB — BASIC METABOLIC PANEL
BUN: 25 mg/dL — ABNORMAL HIGH (ref 6–23)
CO2: 26 mEq/L (ref 19–32)
Calcium: 9.6 mg/dL (ref 8.4–10.5)
Chloride: 100 mEq/L (ref 96–112)
Creatinine, Ser: 1.3 mg/dL (ref 0.4–1.5)
GFR: 72.89 mL/min (ref >60.00)
Glucose, Bld: 128 mg/dL — ABNORMAL HIGH (ref 70–99)
Potassium: 3.6 mEq/L (ref 3.5–5.1)
Sodium: 136 mEq/L (ref 135–145)

## 2013-01-19 ENCOUNTER — Encounter: Payer: Self-pay | Admitting: *Deleted

## 2013-02-02 ENCOUNTER — Encounter: Payer: Self-pay | Admitting: Endocrinology

## 2013-02-02 ENCOUNTER — Ambulatory Visit (INDEPENDENT_AMBULATORY_CARE_PROVIDER_SITE_OTHER): Payer: BC Managed Care – PPO | Admitting: Endocrinology

## 2013-02-02 VITALS — BP 118/70 | HR 84 | Wt 211.0 lb

## 2013-02-02 DIAGNOSIS — E119 Type 2 diabetes mellitus without complications: Secondary | ICD-10-CM

## 2013-02-02 LAB — HEMOGLOBIN A1C: Hgb A1c MFr Bld: 7.8 % — ABNORMAL HIGH (ref 4.6–6.5)

## 2013-02-02 NOTE — Patient Instructions (Addendum)
blood tests are being requested for you today.  We'll contact you with results.   pending the test results, please continue the same medications for now Please make a follow-up appointment in 3 months.  check your blood sugar 2 times a day.  vary the time of day when you check, between before the 3 meals, and at bedtime.  also check if you have symptoms of your blood sugar being too high or too low.  please keep a record of the readings and bring it to your next appointment here.  please call us sooner if you are having low blood sugar episodes.

## 2013-02-02 NOTE — Progress Notes (Signed)
Subjective:    Patient ID: Wayne Webb, male    DOB: 02/22/1966, 47 y.o.   MRN: [id number]  HPI Pt returns for f/u of insulin-requiring DM (dx'ed 2004; no known complications; he has done better with a simpler bid insulin regimen).  pt states he feels well in general.  no cbg record, but states cbg's vary from 103-200.  There is no trend throughout the day. Past Medical History  Diagnosis Date  . HYPERLIPIDEMIA 03/15/2007  . GOITER, MULTINODULAR 03/21/2010  . DIABETES MELLITUS, TYPE II 03/15/2007  . ERECTILE DYSFUNCTION 03/15/2007  . Diplopia 12/24/2009  . ALLERGIC RHINITIS 03/15/2007  . CRI (chronic renal insufficiency)     CRI, creat 1.4 , renal u/s 04-2011 neg    Past Surgical History  Procedure Laterality Date  . Lasik  10-2012    History   Social History  . Marital Status: Married    Spouse Name: N/A    Number of Children: 1  . Years of Education: N/A   Occupational History  . active job Wellsite geologist   Social History Main Topics  . Smoking status: Former Smoker -- 0.50 packs/day for 10 years    Types: Cigarettes    Quit date: 10/26/1993  . Smokeless tobacco: Never Used  . Alcohol Use: Yes     Comment: rarely  . Drug Use: No  . Sexually Active: Not on file   Other Topics Concern  . Not on file   Social History Narrative   Divorced. Engaged to be remarried ---   Exercise:  ~ 2 x/ week, goes to the gym   Diet: very poor in December, going back to a more healthy  diet    Current Outpatient Prescriptions on File Prior to Visit  Medication Sig Dispense Refill  . ACCU-CHEK ACTIVE STRIPS test strip TEST BLOOD SUGAR THREE TIMES DAILY  100 each  6  . AMBULATORY NON FORMULARY MEDICATION Diabetes Nutrition packet daily      . AMBULATORY NON FORMULARY MEDICATION Nitropeak Protein as needed      . B-D ULTRAFINE III SHORT PEN 31G X 8 MM MISC USE AS DIRECTED WITH PEN NEEDLE TWICE DAILY  100 each  0  . Cinnamon 500 MG capsule Take 500 mg by mouth  daily.        . fluticasone (FLONASE) 50 MCG/ACT nasal spray USE 1 SPRAY IN EACH NOSTRIL DAILY  16 g  10  . glucose blood (ONE TOUCH ULTRA TEST) test strip Use as instructed twice daily to check blood sugar  180 each  3  . Insulin Lispro Prot & Lispro (HUMALOG MIX 75/25 KWIKPEN [place]) Inject into the skin. Inject into the skin 38 units with breakfast and 18 units with the evening meal      . insulin lispro protamine-insulin lispro (HUMALOG MIX 75/25 KWIKPEN) (75-25) 100 UNIT/ML SUSP Inject 38 Units into the skin daily with breakfast.  10 mL  12  . Insulin Pen Needle 31G X 5 MM MISC Use as directed twice daily  180 each  3  . LEVITRA 20 MG tablet TAKE AS NEEDED AS DIRECTED  10 tablet  4  . losartan (COZAAR) 50 MG tablet Take 1 tablet (50 mg total) by mouth daily.  30 tablet  1  . Multiple Vitamin (MULTIVITAMIN) tablet Take 1 tablet by mouth daily.      Letta Pate DELICA LANCETS MISC USE THREE TIMES DAILY TO CHECK BLOOD SUGAR  100 each  6   No current facility-administered medications on file prior to visit.    No Known Allergies  Family History  Problem Relation Age of Onset  . Heart disease Mother     CAD-CABG-onset in her early 68's  . Diabetes Mother 45    had DM from her 49's  . Hypertension Mother   . Prostate cancer Father     dx in his 8s  . Stroke Neg Hx   . Colon cancer Neg Hx     BP 118/70  Pulse 84  Wt 211 lb (95.709 kg)  BMI 28.61 kg/m2  SpO2 94%   Review of Systems denies hypoglycemia    Objective:   Physical Exam VITAL SIGNS:  See vs page GENERAL: no distress Pulses: dorsalis pedis intact bilat.   Feet: no deformity.  no ulcer on the feet.  feet are of normal color and temp.  no edema Neuro: sensation is intact to touch on the feet       Assessment & Plan:  DM: he needs increased rx

## 2013-02-14 ENCOUNTER — Other Ambulatory Visit: Payer: Self-pay | Admitting: Internal Medicine

## 2013-02-15 NOTE — Telephone Encounter (Signed)
Refill done.  

## 2013-02-17 ENCOUNTER — Other Ambulatory Visit: Payer: Self-pay | Admitting: *Deleted

## 2013-02-17 MED ORDER — INSULIN PEN NEEDLE 31G X 5 MM MISC: Status: DC

## 2013-02-25 ENCOUNTER — Other Ambulatory Visit: Payer: Self-pay | Admitting: Endocrinology

## 2013-05-10 ENCOUNTER — Telehealth: Payer: Self-pay | Admitting: *Deleted

## 2013-05-10 ENCOUNTER — Encounter: Payer: Self-pay | Admitting: Endocrinology

## 2013-05-10 ENCOUNTER — Ambulatory Visit (INDEPENDENT_AMBULATORY_CARE_PROVIDER_SITE_OTHER): Payer: BC Managed Care – PPO | Admitting: Endocrinology

## 2013-05-10 VITALS — BP 108/64 | HR 78 | Temp 98.3°F | Resp 10 | Wt 218.0 lb

## 2013-05-10 DIAGNOSIS — E119 Type 2 diabetes mellitus without complications: Secondary | ICD-10-CM

## 2013-05-10 LAB — HEMOGLOBIN A1C: Hgb A1c MFr Bld: 6.9 % — ABNORMAL HIGH (ref 4.6–6.5)

## 2013-05-10 NOTE — Patient Instructions (Addendum)
blood tests are being requested for you today.  We'll contact you with results.   Please make a follow-up appointment in 3 months.  check your blood sugar 2 times a day.  vary the time of day when you check, between before the 3 meals, and at bedtime.  also check if you have symptoms of your blood sugar being too high or too low.  please keep a record of the readings and bring it to your next appointment here.  please call us sooner if you are having low blood sugar episodes.

## 2013-05-10 NOTE — Progress Notes (Signed)
Subjective:    Patient ID: Wayne Webb, male    DOB: May 21, 1966, 47 y.o.   MRN: [id number]  HPI Pt returns for f/u of insulin-requiring DM (dx'ed 2004; he has mild if any neuropathy of the lower extremities; no known associated complications; he has done better with a simpler bid insulin regimen).  pt states he feels well in general.  no cbg record, but states cbg's are well-controlled.  There is no trend throughout the day. Past Medical History  Diagnosis Date  . HYPERLIPIDEMIA 03/15/2007  . GOITER, MULTINODULAR 03/21/2010  . DIABETES MELLITUS, TYPE II 03/15/2007  . ERECTILE DYSFUNCTION 03/15/2007  . Diplopia 12/24/2009  . ALLERGIC RHINITIS 03/15/2007  . CRI (chronic renal insufficiency)     CRI, creat 1.4 , renal u/s 04-2011 neg    Past Surgical History  Procedure Laterality Date  . Lasik  10-2012    History   Social History  . Marital Status: Married    Spouse Name: N/A    Number of Children: 1  . Years of Education: N/A   Occupational History  . active job Wellsite geologist   Social History Main Topics  . Smoking status: Former Smoker -- 0.50 packs/day for 10 years    Types: Cigarettes    Quit date: 10/26/1993  . Smokeless tobacco: Never Used  . Alcohol Use: Yes     Comment: rarely  . Drug Use: No  . Sexually Active: Not on file   Other Topics Concern  . Not on file   Social History Narrative   Divorced. Engaged to be remarried ---   Exercise:  ~ 2 x/ week, goes to the gym   Diet: very poor in December, going back to a more healthy  diet    Current Outpatient Prescriptions on File Prior to Visit  Medication Sig Dispense Refill  . ACCU-CHEK ACTIVE STRIPS test strip TEST BLOOD SUGAR THREE TIMES DAILY  100 each  6  . AMBULATORY NON FORMULARY MEDICATION Diabetes Nutrition packet daily      . AMBULATORY NON FORMULARY MEDICATION Nitropeak Protein as needed      . B-D ULTRAFINE III SHORT PEN 31G X 8 MM MISC USE AS DIRECTED WITH PEN NEEDLE TWICE  DAILY  100 each  0  . Cinnamon 500 MG capsule Take 500 mg by mouth daily.        . fluticasone (FLONASE) 50 MCG/ACT nasal spray USE 1 SPRAY IN EACH NOSTRIL DAILY  16 g  10  . glucose blood (ONE TOUCH ULTRA TEST) test strip Use as instructed twice daily to check blood sugar  180 each  3  . Insulin Lispro Prot & Lispro (HUMALOG MIX 75/25 KWIKPEN [place]) Inject into the skin. Inject into the skin 38 units with breakfast and 18 units with the evening meal      . Insulin Pen Needle 31G X 5 MM MISC Use as directed twice daily  200 each  3  . LEVITRA 20 MG tablet TAKE AS NEEDED AS DIRECTED  10 tablet  0  . losartan (COZAAR) 50 MG tablet TAKE ONE TABLET BY MOUTH ONCE DAILY  90 tablet  0  . Multiple Vitamin (MULTIVITAMIN) tablet Take 1 tablet by mouth daily.      Letta Pate DELICA LANCETS MISC USE THREE TIMES DAILY TO CHECK BLOOD SUGAR  100 each  6   No current facility-administered medications on file prior to visit.    No Known Allergies  Family  History  Problem Relation Age of Onset  . Heart disease Mother     CAD-CABG-onset in her early 29's  . Diabetes Mother 75    had DM from her 66's  . Hypertension Mother   . Prostate cancer Father     dx in his 50s  . Stroke Neg Hx   . Colon cancer Neg Hx     BP 108/64  Pulse 78  Temp(Src) 98.3 F (36.8 C) (Oral)  Resp 10  Wt 218 lb (98.884 kg)  BMI 29.56 kg/m2  SpO2 96%  Review of Systems denies hypoglycemia and weight change    Objective:   Physical Exam VITAL SIGNS:  See vs page GENERAL: no distress  Lab Results  Component Value Date   HGBA1C 6.9* 05/10/2013      Assessment & Plan:  DM: This insulin regimen was chosen from multiple options, for its simplicity.  well-controlled.  The benefits of glycemic control must be weighed against the risks of hypoglycemia.

## 2013-05-10 NOTE — Telephone Encounter (Signed)
Pt does not have a phone listed. Letter was dictated and mailed out.

## 2013-06-27 ENCOUNTER — Other Ambulatory Visit: Payer: Self-pay | Admitting: Internal Medicine

## 2013-06-27 NOTE — Telephone Encounter (Signed)
Refill done.  

## 2013-08-14 ENCOUNTER — Encounter: Payer: Self-pay | Admitting: Endocrinology

## 2013-08-14 ENCOUNTER — Ambulatory Visit (INDEPENDENT_AMBULATORY_CARE_PROVIDER_SITE_OTHER): Payer: BC Managed Care – PPO | Admitting: Endocrinology

## 2013-08-14 VITALS — BP 122/86 | HR 93 | Temp 99.0°F | Wt 221.1 lb

## 2013-08-14 DIAGNOSIS — E119 Type 2 diabetes mellitus without complications: Secondary | ICD-10-CM

## 2013-08-14 LAB — HEMOGLOBIN A1C: Hgb A1c MFr Bld: 5.8 % (ref 4.6–6.5)

## 2013-08-14 NOTE — Patient Instructions (Addendum)
blood tests are being requested for you today.  We'll contact you with results. Please make a follow-up appointment in 4 months.  check your blood sugar 2 times a day.  vary the time of day when you check, between before the 3 meals, and at bedtime.  also check if you have symptoms of your blood sugar being too high or too low.  please keep a record of the readings and bring it to your next appointment here.  please call us sooner if you are having low blood sugar episodes.

## 2013-08-14 NOTE — Progress Notes (Signed)
Subjective:    Patient ID: Wayne Webb, male    DOB: 03/19/1966, 47 y.o.   MRN: [id number]  HPI Pt returns for f/u of insulin-requiring DM (dx'ed 2004, on a routine blood test; he has mild if any neuropathy of the lower extremities; he has associated retinopathy; he has done better with a simpler bid insulin regimen).  pt states he feels well in general.  no cbg record, but states cbg's are well-controlled.  There is no trend throughout the day.   Past Medical History  Diagnosis Date  . HYPERLIPIDEMIA 03/15/2007  . GOITER, MULTINODULAR 03/21/2010  . DIABETES MELLITUS, TYPE II 03/15/2007  . ERECTILE DYSFUNCTION 03/15/2007  . Diplopia 12/24/2009  . ALLERGIC RHINITIS 03/15/2007  . CRI (chronic renal insufficiency)     CRI, creat 1.4 , renal u/s 04-2011 neg    Past Surgical History  Procedure Laterality Date  . Lasik  10-2012    History   Social History  . Marital Status: Married    Spouse Name: N/A    Number of Children: 1  . Years of Education: N/A   Occupational History  . active job Wellsite geologist   Social History Main Topics  . Smoking status: Former Smoker -- 0.50 packs/day for 10 years    Types: Cigarettes    Quit date: 10/26/1993  . Smokeless tobacco: Never Used  . Alcohol Use: Yes     Comment: rarely  . Drug Use: No  . Sexual Activity: Not on file   Other Topics Concern  . Not on file   Social History Narrative   Divorced. Engaged to be remarried ---   Exercise:  ~ 2 x/ week, goes to the gym   Diet: very poor in December, going back to a more healthy  diet    Current Outpatient Prescriptions on File Prior to Visit  Medication Sig Dispense Refill  . ACCU-CHEK ACTIVE STRIPS test strip TEST BLOOD SUGAR THREE TIMES DAILY  100 each  6  . AMBULATORY NON FORMULARY MEDICATION Diabetes Nutrition packet daily      . AMBULATORY NON FORMULARY MEDICATION Nitropeak Protein as needed      . B-D ULTRAFINE III SHORT PEN 31G X 8 MM MISC USE AS DIRECTED  WITH PEN NEEDLE TWICE DAILY  100 each  0  . Cinnamon 500 MG capsule Take 500 mg by mouth daily.        . fluticasone (FLONASE) 50 MCG/ACT nasal spray USE 1 SPRAY IN EACH NOSTRIL DAILY  16 g  10  . glucose blood (ONE TOUCH ULTRA TEST) test strip Use as instructed twice daily to check blood sugar  180 each  3  . Insulin Lispro Prot & Lispro (HUMALOG MIX 75/25 KWIKPEN [name]) Inject into the skin. Inject into the skin 35 units with breakfast and 10 units with the evening meal      . Insulin Pen Needle 31G X 5 MM MISC Use as directed twice daily  200 each  3  . LEVITRA 20 MG tablet TAKE AS NEEDED AS DIRECTED  10 tablet  0  . losartan (COZAAR) 50 MG tablet TAKE 1 TABLET BY MOUTH DAILY  90 tablet  0  . Multiple Vitamin (MULTIVITAMIN) tablet Take 1 tablet by mouth daily.      . Multiple Vitamins-Minerals (MULTIVITAMIN WITH MINERALS) tablet Take 3 tablets by mouth daily. Pt takes 3 in the morning. Opti-men Multi vitamin      . ONETOUCH DELICA LANCETS  MISC USE THREE TIMES DAILY TO CHECK BLOOD SUGAR  100 each  6   No current facility-administered medications on file prior to visit.    No Known Allergies  Family History  Problem Relation Age of Onset  . Heart disease Mother     CAD-CABG-onset in her early 65's  . Diabetes Mother 76    had DM from her 5's  . Hypertension Mother   . Prostate cancer Father     dx in his 65s  . Stroke Neg Hx   . Colon cancer Neg Hx    BP 122/86  Pulse 93  Temp(Src) 99 F (37.2 C) (Oral)  Wt 221 lb 1.6 oz (100.29 kg)  BMI 29.98 kg/m2  SpO2 96%  Review of Systems denies hypoglycemia and weight change.    Objective:   Physical Exam VITAL SIGNS:  See vs page GENERAL: no distress SKIN:  Insulin injection sites at the anterior abdomen are normal.   Lab Results  Component Value Date   HGBA1C 5.8 08/14/2013      Assessment & Plan:  DM: This insulin regimen was chosen from multiple options, for its simplicity.  overcontrolled.  The benefits of glycemic  control must be weighed against the risks of hypoglycemia. Retinopathy: this control will help, but a1c does not need to be so low.

## 2013-08-15 ENCOUNTER — Telehealth: Payer: Self-pay | Admitting: *Deleted

## 2013-08-15 NOTE — Telephone Encounter (Signed)
Opened in error

## 2013-08-17 ENCOUNTER — Telehealth: Payer: Self-pay | Admitting: Endocrinology

## 2013-08-17 NOTE — Telephone Encounter (Signed)
Pt called for lab results. Please advise.

## 2013-08-17 NOTE — Telephone Encounter (Signed)
Calling for lab results. °

## 2013-08-17 NOTE — Telephone Encounter (Signed)
please call patient: Blood sugar is too low Please reduce the insulin to 35 units with breakfast and 10 units with the evening meal

## 2013-08-18 NOTE — Telephone Encounter (Signed)
Left message

## 2013-10-16 ENCOUNTER — Telehealth: Payer: Self-pay

## 2013-10-16 ENCOUNTER — Other Ambulatory Visit: Payer: Self-pay | Admitting: Endocrinology

## 2013-10-16 NOTE — Telephone Encounter (Signed)
It is cheapest to buy [facility] brand "70/30" insulin there.  It is not exactly the same as what you take, but similar.  i can send a prescription there if it would help. i hope you feel well.

## 2013-10-16 NOTE — Telephone Encounter (Signed)
Called pt and he stated that his local pharmacy provided him with insulin until his comes through from mail order pharmacy. However, Pt did state if he could switch to a cheaper brand through his mail order that he would like to do that.  Told pt that we would be in touch but he would like to go to a cheaper brand of insulin through his mail order if possible. Please advise,  Thanks

## 2013-10-16 NOTE — Telephone Encounter (Signed)
Called pt. Pt would like to stay pen. Pt states that he received a letter from his insurance concerning changed concerning the insulin he is on. Told pt to sent [place] the letter so Dr could review.

## 2013-10-16 NOTE — Telephone Encounter (Signed)
Ok, i can send it.  To make it cheaper, you would need to draw up from the bottle.  Ok?

## 2013-10-16 NOTE — Telephone Encounter (Signed)
Pt called stating that he is completely out of insulin. Asked for samples instructed that samples are given during office visit.   Pt wanted to know if anything else could be done. Please advise, Thanks!

## 2013-10-17 NOTE — Telephone Encounter (Signed)
Informed pt and stated that he would contact his mail order pharmacy to get the script faxed to our office.

## 2013-10-17 NOTE — Telephone Encounter (Signed)
Ok, it is fine to stay with pens.  However, if the prescription has not yet come from mail-order, you have 2 options: we can send to local pharmacy, or you could buy a bottle of "70/30" from [facility], and draw it up with a syringe.

## 2013-10-22 ENCOUNTER — Telehealth: Payer: Self-pay | Admitting: Endocrinology

## 2013-10-22 MED ORDER — INSULIN ASPART PROT & ASPART (70-30 MIX) 100 UNIT/ML PEN: PEN_INJECTOR | SUBCUTANEOUS | Status: DC

## 2013-10-22 NOTE — Telephone Encounter (Signed)
please call patient: In response to fax from your insurance, i have sent a prescription to your pharmacy, to change to their preferred brand. The # of units, and the pen instructions, are the same.

## 2013-10-23 NOTE — Telephone Encounter (Signed)
Pt informed of changes.

## 2013-10-31 ENCOUNTER — Other Ambulatory Visit: Payer: Self-pay | Admitting: Internal Medicine

## 2013-11-28 ENCOUNTER — Other Ambulatory Visit: Payer: Self-pay | Admitting: Endocrinology

## 2013-12-01 ENCOUNTER — Other Ambulatory Visit: Payer: Self-pay

## 2013-12-01 MED ORDER — INSULIN ASPART PROT & ASPART (70-30 MIX) 100 UNIT/ML PEN: PEN_INJECTOR | SUBCUTANEOUS | Status: DC

## 2013-12-18 ENCOUNTER — Ambulatory Visit (INDEPENDENT_AMBULATORY_CARE_PROVIDER_SITE_OTHER): Payer: BC Managed Care – PPO | Admitting: Endocrinology

## 2013-12-18 ENCOUNTER — Encounter: Payer: Self-pay | Admitting: Endocrinology

## 2013-12-18 VITALS — BP 118/76 | HR 93 | Temp 98.7°F | Ht 72.0 in | Wt 217.0 lb

## 2013-12-18 DIAGNOSIS — E1039 Type 1 diabetes mellitus with other diabetic ophthalmic complication: Secondary | ICD-10-CM

## 2013-12-18 DIAGNOSIS — E119 Type 2 diabetes mellitus without complications: Secondary | ICD-10-CM

## 2013-12-18 DIAGNOSIS — E042 Nontoxic multinodular goiter: Secondary | ICD-10-CM

## 2013-12-18 NOTE — Progress Notes (Signed)
Subjective:    Patient ID: Wayne Webb, male    DOB: [birth year], 48 y.o.   MRN: [id number]  HPI Pt returns for f/u of insulin-requiring DM (dx'ed 2004, on a routine blood test; he has mild if any neuropathy of the lower extremities; he has associated retinopathy; he is willing to take insulin only as often as bid; he has never had severe hypoglycemia or DKA).  pt states he feels well in general.  no cbg record, but states cbg's vary from 100-200.  It is in general higher in am than pm.   Past Medical History  Diagnosis Date  . HYPERLIPIDEMIA 03/15/2007  . GOITER, MULTINODULAR 03/21/2010  . DIABETES MELLITUS, TYPE II 03/15/2007  . ERECTILE DYSFUNCTION 03/15/2007  . Diplopia 12/24/2009  . ALLERGIC RHINITIS 03/15/2007  . CRI (chronic renal insufficiency)     CRI, creat 1.4 , renal u/s 04-2011 neg    Past Surgical History  Procedure Laterality Date  . Lasik  10-2012    History   Social History  . Marital Status: Married    Spouse Name: N/A    Number of Children: 1  . Years of Education: N/A   Occupational History  . active job Science writer   Social History Main Topics  . Smoking status: Former Smoker -- 0.50 packs/day for 10 years    Types: Cigarettes    Quit date: 10/26/1993  . Smokeless tobacco: Never Used  . Alcohol Use: Yes     Comment: rarely  . Drug Use: No  . Sexual Activity: Not on file   Other Topics Concern  . Not on file   Social History Narrative   Divorced. Engaged to be remarried ---   Exercise:  ~ 2 x/ week, goes to the gym   Diet: very poor in December, going back to a more healthy  diet    Current Outpatient Prescriptions on File Prior to Visit  Medication Sig Dispense Refill  . ACCU-CHEK ACTIVE STRIPS test strip TEST BLOOD SUGAR THREE TIMES DAILY  100 each  6  . AMBULATORY NON FORMULARY MEDICATION Diabetes Nutrition packet daily      . AMBULATORY NON FORMULARY MEDICATION Nitropeak Protein as needed      . B-D ULTRAFINE III  SHORT PEN 31G X 8 MM MISC USE AS DIRECTED WITH PEN NEEDLE TWICE DAILY  100 each  0  . Cinnamon 500 MG capsule Take 500 mg by mouth daily.        . fluticasone (FLONASE) 50 MCG/ACT nasal spray USE 1 SPRAY IN EACH NOSTRIL DAILY  16 g  10  . glucose blood (ONE TOUCH ULTRA TEST) test strip Use as instructed twice daily to check blood sugar  180 each  3  . Insulin Aspart Prot & Aspart (NOVOLOG MIX 70/30 FLEXPEN) (70-30) 100 UNIT/ML Pen 35 units with breakfast and 10 units with the evening meal  5 pen  11  . Insulin Pen Needle 31G X 5 MM MISC Use as directed twice daily  200 each  3  . LEVITRA 20 MG tablet TAKE AS NEEDED AS DIRECTED  10 tablet  0  . losartan (COZAAR) 50 MG tablet TAKE 1 TABLET BY MOUTH DAILY  90 tablet  0  . Multiple Vitamin (MULTIVITAMIN) tablet Take 1 tablet by mouth daily.      . Multiple Vitamins-Minerals (MULTIVITAMIN WITH MINERALS) tablet Take 3 tablets by mouth daily. Pt takes 3 in the morning. Opti-men Multi vitamin      .  ONE TOUCH ULTRA TEST test strip USE 2 TIMES DAILY AS DIRECTED TO CHECK BLOOD SUGAR  200 each  0  . ONETOUCH DELICA LANCETS MISC USE THREE TIMES DAILY TO CHECK BLOOD SUGAR  100 each  6   No current facility-administered medications on file prior to visit.    No Known Allergies  Family History  Problem Relation Age of Onset  . Heart disease Mother     CAD-CABG-onset in her early 98's  . Diabetes Mother 21    had DM from her 22's  . Hypertension Mother   . Prostate cancer Father     dx in his 1s  . Stroke Neg Hx   . Colon cancer Neg Hx     BP 118/76  Pulse 93  Temp(Src) 98.7 F (37.1 C) (Oral)  Ht 6' (1.829 m)  Wt 217 lb (98.431 kg)  BMI 29.42 kg/m2  SpO2 96%  Review of Systems denies hypoglycemia and weight change    Objective:   Physical Exam VITAL SIGNS:  See vs page GENERAL: no distress     Assessment & Plan:  DM: This insulin regimen was chosen from multiple options, for its simplicity.  He has recent h/o overcontrol.  The  benefits of glycemic control must be weighed against the risks of hypoglycemia. Retinopathy: this control will help, but a1c does not need to be as low as it has recently been.

## 2013-12-18 NOTE — Patient Instructions (Addendum)
blood and urine tests are being requested for you today.  We'll contact you with results. Please make a follow-up appointment in 4 months.  check your blood sugar 2 times a day.  vary the time of day when you check, between before the 3 meals, and at bedtime.  also check if you have symptoms of your blood sugar being too high or too low.  please keep a record of the readings and bring it to your next appointment here.  please call us sooner if you are having low blood sugar episodes.

## 2013-12-22 ENCOUNTER — Encounter: Payer: BC Managed Care – PPO | Admitting: Internal Medicine

## 2013-12-27 ENCOUNTER — Encounter: Payer: Self-pay | Admitting: Endocrinology

## 2013-12-27 LAB — HM DIABETES EYE EXAM

## 2014-01-02 ENCOUNTER — Telehealth: Payer: Self-pay

## 2014-01-02 NOTE — Telephone Encounter (Signed)
Left message for call back Non identifiable  Flu vaccine--07/2013 Tdap--2006 PNA--2007 PSA--11/2012--1.50 CCS-- not had one yet

## 2014-01-03 ENCOUNTER — Ambulatory Visit
Admission: RE | Admit: 2014-01-03 | Discharge: 2014-01-03 | Disposition: A | Discharge status: Home / Self Care | Payer: BC Managed Care – PPO | Source: Ambulatory Visit | Attending: Endocrinology | Admitting: Endocrinology

## 2014-01-03 ENCOUNTER — Encounter: Payer: Self-pay | Admitting: Internal Medicine

## 2014-01-03 ENCOUNTER — Ambulatory Visit (INDEPENDENT_AMBULATORY_CARE_PROVIDER_SITE_OTHER): Payer: BC Managed Care – PPO | Admitting: Internal Medicine

## 2014-01-03 VITALS — BP 109/71 | HR 77 | Temp 98.2°F | Ht 72.0 in | Wt 214.0 lb

## 2014-01-03 DIAGNOSIS — Z0000 Encounter for general adult medical examination without abnormal findings: Secondary | ICD-10-CM

## 2014-01-03 DIAGNOSIS — Z23 Encounter for immunization: Secondary | ICD-10-CM

## 2014-01-03 DIAGNOSIS — R945 Abnormal results of liver function studies: Secondary | ICD-10-CM

## 2014-01-03 DIAGNOSIS — R7989 Other specified abnormal findings of blood chemistry: Secondary | ICD-10-CM | POA: Insufficient documentation

## 2014-01-03 DIAGNOSIS — E119 Type 2 diabetes mellitus without complications: Secondary | ICD-10-CM

## 2014-01-03 DIAGNOSIS — E042 Nontoxic multinodular goiter: Secondary | ICD-10-CM

## 2014-01-03 DIAGNOSIS — I10 Essential (primary) hypertension: Secondary | ICD-10-CM

## 2014-01-03 DIAGNOSIS — J309 Allergic rhinitis, unspecified: Secondary | ICD-10-CM

## 2014-01-03 NOTE — Telephone Encounter (Signed)
Unable to reach prior to visit  

## 2014-01-03 NOTE — Progress Notes (Signed)
Subjective:    Patient ID: Wayne Webb, male    DOB: 13-May-1966, 48 y.o.   MRN: [id number]  DOS:  01/03/2014 Type of  visit: CPX Also, went to play bowling 4 days ago, while he was playing developed a soreness in the left upper chest, no radiation, lasted 2 days, soreness increased with arm  stretching. No associated shortness of breath. Reports also persistent dry cough on and off, mostly related to clearing his throat. Some postnasal dripping, has Flonase but does not use regulalrly  ROS Diet-- not very good during December , getting back in track Exercise-- not much x 3 months  No palpitations, no lower extremity edema Denies  nausea, vomiting diarrhea Denies  blood in the stools No GERD  Sx. No dysphagia or odynophagia  (-) wheezing, chest congestion No recent airplane trips or prolonged car trips No calves pain or swelling No dysuria, gross hematuria, difficulty urinating     Past Medical History  Diagnosis Date  . HYPERLIPIDEMIA 03/15/2007  . GOITER, MULTINODULAR 03/21/2010  . DIABETES MELLITUS, TYPE II 03/15/2007  . ERECTILE DYSFUNCTION 03/15/2007  . Diplopia 12/24/2009  . ALLERGIC RHINITIS 03/15/2007  . CRI (chronic renal insufficiency)     CRI, creat 1.4 , renal u/s 04-2011 neg    Past Surgical History  Procedure Laterality Date  . Lasik  10-2012    History   Social History  . Marital Status: Married    Spouse Name: N/A    Number of Children: 1  . Years of Education: N/A   Occupational History  . active job Science writer   Social History Main Topics  . Smoking status: Former Smoker -- 0.50 packs/day for 10 years    Types: Cigarettes    Quit date: 10/26/1993  . Smokeless tobacco: Never Used  . Alcohol Use: No     Comment:    . Drug Use: No  . Sexual Activity: Not on file   Other Topics Concern  . Not on file   Social History Narrative   Divorced. Remarried ---      Family History  Problem Relation Age of Onset  .  Heart disease Mother     CABG-onset in her early 14's  . Diabetes Mother 57    had DM from her 5's  . Hypertension Mother   . Prostate cancer Father     dx in his 66s  . Stroke Neg Hx   . Colon cancer Neg Hx        Medication List       This list is accurate as of: 01/03/14 11:59 PM.  Always use your most recent med list.               AMBULATORY NON FORMULARY MEDICATION  Diabetes Nutrition packet daily     AMBULATORY NON FORMULARY MEDICATION  Nitropeak Protein as needed     B-D ULTRAFINE III SHORT PEN 31G X 8 MM Misc  Generic drug:  Insulin Pen Needle  USE AS DIRECTED WITH PEN NEEDLE TWICE DAILY     Insulin Pen Needle 31G X 5 MM Misc  Use as directed twice daily     Cinnamon 500 MG capsule  Take 500 mg by mouth daily.     fluticasone 50 MCG/ACT nasal spray  Commonly known as:  FLONASE  USE 1 SPRAY IN EACH NOSTRIL DAILY     glucose blood test strip  Commonly known as:  ONE TOUCH  ULTRA TEST  Use as instructed twice daily to check blood sugar     ACCU-CHEK ACTIVE STRIPS test strip  Generic drug:  glucose blood  TEST BLOOD SUGAR THREE TIMES DAILY     ONE TOUCH ULTRA TEST test strip  Generic drug:  glucose blood  USE 2 TIMES DAILY AS DIRECTED TO CHECK BLOOD SUGAR     Insulin Aspart Prot & Aspart (70-30) 100 UNIT/ML Pen  Commonly known as:  NOVOLOG MIX 70/30 FLEXPEN  35 units with breakfast and 10 units with the evening meal     LEVITRA 20 MG tablet  Generic drug:  vardenafil  TAKE AS NEEDED AS DIRECTED     losartan 50 MG tablet  Commonly known as:  COZAAR  TAKE 1 TABLET BY MOUTH DAILY     multivitamin tablet  Take 1 tablet by mouth daily.     multivitamin with minerals tablet  Take 3 tablets by mouth daily. Pt takes 3 in the morning. Opti-men Multi vitamin     ONETOUCH DELICA LANCETS Misc  USE THREE TIMES DAILY TO CHECK BLOOD SUGAR           Objective:   Physical Exam BP 109/71  Pulse 77  Temp(Src) 98.2 F (36.8 C)  Ht 6' (1.829 m)  Wt  214 lb (97.07 kg)  BMI 29.02 kg/m2  SpO2 95% General -- alert, well-developed, NAD.  HEENT-- Not pale.  Lungs -- normal respiratory effort, no intercostal retractions, no accessory muscle use, and normal breath sounds.  Heart-- normal rate, regular rhythm, no murmur.  Abdomen-- Not distended, good bowel sounds,soft, non-tender.  Rectal-- No external abnormalities noted. Normal sphincter tone. No rectal masses or tenderness. no stool found Prostate--Prostate gland firm and smooth, no enlargement, nodularity, tenderness, mass, asymmetry or induration. Extremities-- no pretibial edema bilaterally  Neurologic--  alert & oriented X3. Speech normal, gait normal, strength normal in all extremities.  Psych-- Cognition and judgment appear intact. Cooperative with normal attention span and concentration. No anxious or depressed appearing.        Assessment & Plan:

## 2014-01-03 NOTE — Assessment & Plan Note (Addendum)
Hepatitis serology negative 11-2012. Ferritin was slightly elevated at around 400. Does not drink ETOH Plan: Monitor  LFTs, liver ultrasound.

## 2014-01-03 NOTE — Patient Instructions (Signed)
Get your blood work before you leave   Next visit is for a physical exam in 1 year, fasting Please make an appointment    

## 2014-01-03 NOTE — Assessment & Plan Note (Addendum)
BP well-controlled, see previous entry. Will check a BMP.

## 2014-01-03 NOTE — Assessment & Plan Note (Signed)
Persistent, mild dry cough and PND, recommend consistent use Flonase

## 2014-01-03 NOTE — Assessment & Plan Note (Addendum)
Td 2006 pneumonia shot 23 ----> 2007 pnm 13----- today flu shot  @ his job  +family history of prostate cancer , DRE normal will check a PSA today.   he never had a colonoscopy, request a gi referral   Also, had an episode of  atypical chest pain, similar symptoms in 2013, at that time a stress test was negative, EKG today normal sinus rhythm And not different from previous EKG. Recommend observation. Patient will call if symptoms resurface, ER if symptoms severe.  diet -exercise!!

## 2014-01-03 NOTE — Assessment & Plan Note (Addendum)
Per Dr Loanne Drilling, patient requested a A1c

## 2014-01-03 NOTE — Progress Notes (Signed)
Pre visit review using our clinic review tool, if applicable. No additional management support is needed unless otherwise documented below in the visit note. 

## 2014-01-04 ENCOUNTER — Other Ambulatory Visit: Payer: Self-pay | Admitting: Endocrinology

## 2014-01-04 ENCOUNTER — Other Ambulatory Visit: Payer: Self-pay | Admitting: *Deleted

## 2014-01-04 LAB — CBC WITH DIFFERENTIAL/PLATELET
Basophils Absolute: 0 10*3/uL (ref 0.0–0.1)
Basophils Relative: 0.9 % (ref 0.0–3.0)
Eosinophils Absolute: 0.2 10*3/uL (ref 0.0–0.7)
Eosinophils Relative: 5.3 % — ABNORMAL HIGH (ref 0.0–5.0)
HCT: 41.9 % (ref 39.0–52.0)
Hemoglobin: 14.1 g/dL (ref 13.0–17.0)
Lymphocytes Relative: 22.2 % (ref 12.0–46.0)
Lymphs Abs: 0.9 10*3/uL (ref 0.7–4.0)
MCHC: 33.6 g/dL (ref 30.0–36.0)
MCV: 87.3 fl (ref 78.0–100.0)
Monocytes Absolute: 0.9 10*3/uL (ref 0.1–1.0)
Monocytes Relative: 21.7 % — ABNORMAL HIGH (ref 3.0–12.0)
Neutro Abs: 2 10*3/uL (ref 1.4–7.7)
Neutrophils Relative %: 49.9 % (ref 43.0–77.0)
Platelets: 219 10*3/uL (ref 150.0–400.0)
RBC: 4.81 Mil/uL (ref 4.22–5.81)
RDW: 12 % (ref 11.5–14.6)
WBC: 3.9 10*3/uL — ABNORMAL LOW (ref 4.5–10.5)

## 2014-01-04 LAB — COMPREHENSIVE METABOLIC PANEL
ALT: 62 U/L — ABNORMAL HIGH (ref 0–53)
AST: 54 U/L — ABNORMAL HIGH (ref 0–37)
Albumin: 4.6 g/dL (ref 3.5–5.2)
Alkaline Phosphatase: 116 U/L (ref 39–117)
BUN: 35 mg/dL — ABNORMAL HIGH (ref 6–23)
CO2: 29 mEq/L (ref 19–32)
Calcium: 10.4 mg/dL (ref 8.4–10.5)
Chloride: 105 mEq/L (ref 96–112)
Creatinine, Ser: 1.4 mg/dL (ref 0.4–1.5)
GFR: 66.82 mL/min (ref >60.00)
Glucose, Bld: 151 mg/dL — ABNORMAL HIGH (ref 70–99)
Potassium: 4.7 mEq/L (ref 3.5–5.1)
Sodium: 139 mEq/L (ref 135–145)
Total Bilirubin: 1.7 mg/dL — ABNORMAL HIGH (ref 0.3–1.2)
Total Protein: 8.6 g/dL — ABNORMAL HIGH (ref 6.0–8.3)

## 2014-01-04 LAB — LIPID PANEL
Cholesterol: 192 mg/dL (ref 0–200)
HDL: 45.2 mg/dL (ref >39.00)
LDL Cholesterol: 127 mg/dL — ABNORMAL HIGH (ref 0–99)
Total CHOL/HDL Ratio: 4
Triglycerides: 97 mg/dL (ref 0.0–149.0)
VLDL: 19.4 mg/dL (ref 0.0–40.0)

## 2014-01-04 LAB — HEMOGLOBIN A1C: Hgb A1c MFr Bld: 9.2 % — ABNORMAL HIGH (ref 4.6–6.5)

## 2014-01-04 LAB — PSA: PSA: 2.08 ng/mL (ref 0.10–4.00)

## 2014-01-04 MED ORDER — FLUTICASONE PROPIONATE 50 MCG/ACT NA SUSP
2.0000 | Freq: Every day | NASAL | Status: DC
Start: 2014-01-04 — End: 2015-01-21

## 2014-01-05 ENCOUNTER — Encounter: Payer: Self-pay | Admitting: Gastroenterology

## 2014-01-09 ENCOUNTER — Encounter: Payer: Self-pay | Admitting: *Deleted

## 2014-01-09 ENCOUNTER — Telehealth: Payer: Self-pay | Admitting: Internal Medicine

## 2014-01-09 NOTE — Telephone Encounter (Signed)
Done . Wayne Webb with pt regarding labs

## 2014-01-09 NOTE — Telephone Encounter (Signed)
Patient called to return your phone call back.  °

## 2014-01-17 ENCOUNTER — Other Ambulatory Visit (INDEPENDENT_AMBULATORY_CARE_PROVIDER_SITE_OTHER): Payer: BC Managed Care – PPO

## 2014-01-17 ENCOUNTER — Ambulatory Visit
Admission: RE | Admit: 2014-01-17 | Discharge: 2014-01-17 | Disposition: A | Discharge status: Home / Self Care | Payer: BC Managed Care – PPO | Source: Ambulatory Visit | Attending: Internal Medicine | Admitting: Internal Medicine

## 2014-01-17 DIAGNOSIS — E042 Nontoxic multinodular goiter: Secondary | ICD-10-CM

## 2014-01-17 DIAGNOSIS — R7989 Other specified abnormal findings of blood chemistry: Secondary | ICD-10-CM

## 2014-01-17 DIAGNOSIS — R945 Abnormal results of liver function studies: Principal | ICD-10-CM

## 2014-01-17 DIAGNOSIS — E119 Type 2 diabetes mellitus without complications: Secondary | ICD-10-CM

## 2014-01-17 LAB — TSH: TSH: 0.56 u[IU]/mL (ref 0.35–5.50)

## 2014-01-17 LAB — MICROALBUMIN / CREATININE URINE RATIO
Creatinine,U: 195.2 mg/dL
Microalb Creat Ratio: 4.1 mg/g (ref 0.0–30.0)
Microalb, Ur: 8.1 mg/dL — ABNORMAL HIGH (ref 0.0–1.9)

## 2014-01-17 LAB — HEMOGLOBIN A1C: Hgb A1c MFr Bld: 8.6 % — ABNORMAL HIGH (ref 4.6–6.5)

## 2014-01-22 ENCOUNTER — Telehealth: Payer: Self-pay | Admitting: Internal Medicine

## 2014-01-22 DIAGNOSIS — R7989 Other specified abnormal findings of blood chemistry: Secondary | ICD-10-CM

## 2014-01-22 DIAGNOSIS — R945 Abnormal results of liver function studies: Principal | ICD-10-CM

## 2014-01-22 NOTE — Telephone Encounter (Signed)
Ultrasound was negative. Advise patient need labs : orders already in If labs neg consider GI referral.

## 2014-01-23 NOTE — Telephone Encounter (Signed)
Pt notified, pt scheduled 01/25/14 labs

## 2014-01-25 ENCOUNTER — Other Ambulatory Visit: Payer: BC Managed Care – PPO

## 2014-02-14 ENCOUNTER — Other Ambulatory Visit: Payer: Self-pay | Admitting: Internal Medicine

## 2014-02-15 ENCOUNTER — Ambulatory Visit (AMBULATORY_SURGERY_CENTER): Payer: Self-pay

## 2014-02-15 VITALS — Ht 72.0 in | Wt 213.0 lb

## 2014-02-15 DIAGNOSIS — Z1211 Encounter for screening for malignant neoplasm of colon: Secondary | ICD-10-CM

## 2014-02-15 MED ORDER — SUPREP BOWEL PREP KIT 17.5-3.13-1.6 GM/177ML PO SOLN
1.0000 | Freq: Once | ORAL | Status: DC
Start: 2014-02-15 — End: 2014-03-01

## 2014-02-15 NOTE — Progress Notes (Signed)
No allergies to eggs or soy. No diet/weight loss meds. No home oxygen.

## 2014-02-16 ENCOUNTER — Encounter: Payer: Self-pay | Admitting: Gastroenterology

## 2014-02-20 ENCOUNTER — Other Ambulatory Visit: Payer: Self-pay | Admitting: Endocrinology

## 2014-02-21 ENCOUNTER — Other Ambulatory Visit: Payer: Self-pay | Admitting: Endocrinology

## 2014-02-25 ENCOUNTER — Other Ambulatory Visit: Payer: Self-pay | Admitting: Endocrinology

## 2014-02-28 ENCOUNTER — Telehealth: Payer: Self-pay | Admitting: Gastroenterology

## 2014-02-28 NOTE — Telephone Encounter (Signed)
Pt with DM notes BS=52 during bowel prep for colonoscopy tomorrow. Advised to have sugar containing candy and drinks now and to take 1/4 his pm insulin dose instead of 1/2 as previously planned.

## 2014-03-01 ENCOUNTER — Encounter: Payer: Self-pay | Admitting: Gastroenterology

## 2014-03-01 ENCOUNTER — Ambulatory Visit (AMBULATORY_SURGERY_CENTER): Payer: BC Managed Care – PPO | Admitting: Gastroenterology

## 2014-03-01 VITALS — BP 106/69 | HR 67 | Temp 96.0°F | Resp 13 | Ht 72.0 in | Wt 213.0 lb

## 2014-03-01 DIAGNOSIS — D126 Benign neoplasm of colon, unspecified: Secondary | ICD-10-CM

## 2014-03-01 DIAGNOSIS — Z1282 Encounter for screening for malignant neoplasm of nervous system: Secondary | ICD-10-CM

## 2014-03-01 MED ORDER — SODIUM CHLORIDE 0.9 % IV SOLN: 500.0000 mL | INTRAVENOUS | Status: DC

## 2014-03-01 NOTE — Op Note (Signed)
[facility] [place] [place] [place]  [address]. [place], [id number]   COLONOSCOPY PROCEDURE REPORT  PATIENT: Wayne, Webb  MR#: [id number] BIRTHDATE: September 24, 1966 , 48  yrs. old GENDER: Male ENDOSCOPIST: Inda Castle, MD REFERRED NL:[id number] Larose Kells, M.D. PROCEDURE DATE:  03/01/2014 PROCEDURE:   Colonoscopy with cold biopsy polypectomy First Screening Colonoscopy - Avg.  risk and is 50 yrs.  old or older Yes.  Prior Negative Screening - Now for repeat screening. N/A  History of Adenoma - Now for follow-up colonoscopy & has been > or = to 3 yrs.  N/A  Polyps Removed Today? Yes. ASA CLASS:   Class II INDICATIONS:average risk screening. MEDICATIONS: MAC sedation, administered by CRNA and Propofol (Diprivan) 340 mg IV  DESCRIPTION OF PROCEDURE:   After the risks benefits and alternatives of the procedure were thoroughly explained, informed consent was obtained.  A digital rectal exam revealed no abnormalities of the rectum.   The [id number]  endoscope was introduced through the anus and advanced to the cecum, which was identified by both the appendix and ileocecal valve. No adverse events experienced.   The quality of the prep was excellent using Suprep  The instrument was then slowly withdrawn as the colon was fully examined.      COLON FINDINGS: A sessile polyp measuring 2 mm in size was found in the proximal descending colon.  A polypectomy was performed with cold forceps.   The colon was otherwise normal.  There was no diverticulosis, inflammation, polyps or cancers unless previously stated.  Retroflexed views revealed no abnormalities. The time to cecum=2 minutes 20 seconds.  Withdrawal time=10 minutes 0 seconds. The scope was withdrawn and the procedure completed. COMPLICATIONS: There were no complications.  ENDOSCOPIC IMPRESSION: 1.   Sessile polyp measuring 2 mm in size was found in the proximal descending colon; polypectomy was performed with cold forceps 2.    The colon was otherwise normal  RECOMMENDATIONS: If the polyp(s) removed today are proven to be adenomatous (pre-cancerous) polyps, you will need a repeat colonoscopy in 5 years.  Otherwise you should continue to follow colorectal cancer screening guidelines for "routine risk" patients with colonoscopy in 10 years.  You will receive a letter within 1-2 weeks with the results of your biopsy as well as final recommendations.  Please call my office if you have not received a letter after 3 weeks.   eSigned:  Inda Castle, MD 03/01/2014 10:07 AM   cc:   PATIENT NAME:  Wayne, Webb MR#: [id number]

## 2014-03-01 NOTE — Progress Notes (Signed)
Report to pacu rn, vss, bbs=clear 

## 2014-03-01 NOTE — Patient Instructions (Signed)
YOU HAD AN ENDOSCOPIC PROCEDURE TODAY AT THE [facility]: Refer to the procedure report that was given to you for any specific questions about what was found during the examination.  If the procedure report does not answer your questions, please call your gastroenterologist to clarify.  If you requested that your care partner not be given the details of your procedure findings, then the procedure report has been included in a sealed envelope for you to review at your convenience later.  YOU SHOULD EXPECT: Some feelings of bloating in the abdomen. Passage of more gas than usual.  Walking can help get rid of the air that was put into your GI tract during the procedure and reduce the bloating. If you had a lower endoscopy (such as a colonoscopy or flexible sigmoidoscopy) you may notice spotting of blood in your stool or on the toilet paper. If you underwent a bowel prep for your procedure, then you may not have a normal bowel movement for a few days.  DIET: Your first meal following the procedure should be a light meal and then it is ok to progress to your normal diet.  A half-sandwich or bowl of soup is an example of a good first meal.  Heavy or fried foods are harder to digest and may make you feel nauseous or bloated.  Likewise meals heavy in dairy and vegetables can cause extra gas to form and this can also increase the bloating.  Drink plenty of fluids but you should avoid alcoholic beverages for 24 hours.  ACTIVITY: Your care partner should take you home directly after the procedure.  You should plan to take it easy, moving slowly for the rest of the day.  You can resume normal activity the day after the procedure however you should NOT DRIVE or use heavy machinery for 24 hours (because of the sedation medicines used during the test).    SYMPTOMS TO REPORT IMMEDIATELY: A gastroenterologist can be reached at any hour.  During normal business hours, 8:30 AM to 5:00 PM Monday through Friday,  call [phone].  After hours and on weekends, please call the GI answering service at [phone] who will take a message and have the physician on call contact you.   Following lower endoscopy (colonoscopy or flexible sigmoidoscopy):  Excessive amounts of blood in the stool  Significant tenderness or worsening of abdominal pains  Swelling of the abdomen that is new, acute  Fever of 100F or higher    FOLLOW UP: If any biopsies were taken you will be contacted by phone or by letter within the next 1-3 weeks.  Call your gastroenterologist if you have not heard about the biopsies in 3 weeks.  Our staff will call the home number listed on your records the next business day following your procedure to check on you and address any questions or concerns that you may have at that time regarding the information given to you following your procedure. This is a courtesy call and so if there is no answer at the home number and we have not heard from you through the emergency physician on call, we will assume that you have returned to your regular daily activities without incident.  SIGNATURES/CONFIDENTIALITY: You and/or your care partner have signed paperwork which will be entered into your electronic medical record.  These signatures attest to the fact that that the information above on your After Visit Summary has been reviewed and is understood.  Full responsibility of the confidentiality   this discharge information lies with you and/or your care-partner.   INFORMATION ON POLYPS GIVEN TO YOU TODAY 

## 2014-03-01 NOTE — Progress Notes (Signed)
Called to room to assist during endoscopic procedure.  Patient ID and intended procedure confirmed with present staff. Received instructions for my participation in the procedure from the performing physician.  

## 2014-03-02 ENCOUNTER — Telehealth: Payer: Self-pay | Admitting: *Deleted

## 2014-03-02 NOTE — Telephone Encounter (Signed)
  Follow up Call-  Call back number 03/01/2014  Post procedure Call Back phone  # [phone], [phone] work  Permission to leave phone message Yes    [facility]

## 2014-03-05 ENCOUNTER — Encounter: Payer: Self-pay | Admitting: Endocrinology

## 2014-03-05 ENCOUNTER — Ambulatory Visit (INDEPENDENT_AMBULATORY_CARE_PROVIDER_SITE_OTHER): Payer: BC Managed Care – PPO | Admitting: Endocrinology

## 2014-03-05 VITALS — BP 122/82 | HR 80 | Temp 98.6°F | Ht 72.0 in | Wt 212.0 lb

## 2014-03-05 DIAGNOSIS — E119 Type 2 diabetes mellitus without complications: Secondary | ICD-10-CM

## 2014-03-05 NOTE — Progress Notes (Signed)
Subjective:    Patient ID: Wayne Webb, male    DOB: 05-Jul-1966, 48 y.o.   MRN: [id number]  HPI Pt returns for f/u of insulin-requiring DM (dx'ed 2004, on a routine blood test; he has mild if any neuropathy of the lower extremities; he has associated retinopathy; he is willing to take insulin only as often as bid; he has never had severe hypoglycemia or DKA).  He denies hypoglycemia, except a mild episode prior to his colonoscopy.  no cbg record, but states cbg's vary from 120-200.  He says it is highest in am.   Past Medical History  Diagnosis Date  . HYPERLIPIDEMIA 03/15/2007  . GOITER, MULTINODULAR 03/21/2010  . DIABETES MELLITUS, TYPE II 03/15/2007  . ERECTILE DYSFUNCTION 03/15/2007  . Diplopia 12/24/2009  . ALLERGIC RHINITIS 03/15/2007  . CRI (chronic renal insufficiency)     CRI, creat 1.4 , renal u/s 04-2011 neg    Past Surgical History  Procedure Laterality Date  . Lasik  10-2012    History   Social History  . Marital Status: Married    Spouse Name: N/A    Number of Children: 1  . Years of Education: N/A   Occupational History  . active job Science writer   Social History Main Topics  . Smoking status: Former Smoker -- 0.50 packs/day for 10 years    Types: Cigarettes    Quit date: 10/26/1993  . Smokeless tobacco: Never Used  . Alcohol Use: No     Comment:    . Drug Use: No  . Sexual Activity: Not on file   Other Topics Concern  . Not on file   Social History Narrative   Divorced. Remarried ---     Current Outpatient Prescriptions on File Prior to Visit  Medication Sig Dispense Refill  . B-D UF III MINI PEN NEEDLES 31G X 5 MM MISC USE AS DIRECTED 2 TIMES DAILY  180 each  0  . fluticasone (FLONASE) 50 MCG/ACT nasal spray Place 2 sprays into both nostrils daily.  16 g  10  . Insulin Aspart Prot & Aspart (NOVOLOG 70/30 MIX) (70-30) 100 UNIT/ML Pen 40 units with breakfast and 20 units with the evening meal      . LEVITRA 20 MG tablet  TAKE AS NEEDED AS DIRECTED  10 tablet  0  . losartan (COZAAR) 50 MG tablet TAKE 1 TABLET BY MOUTH EVERY DAY  90 tablet  2  . ONE TOUCH ULTRA TEST test strip USE 2 TIMES DAILY AS DIRECTED TO CHECK BLOOD SUGAR  50 each  0   No current facility-administered medications on file prior to visit.    No Known Allergies  Family History  Problem Relation Age of Onset  . Heart disease Mother     CABG-onset in her early 52's  . Diabetes Mother 52    had DM from her 10's  . Hypertension Mother   . Prostate cancer Father     dx in his 37s  . Stroke Neg Hx   . Colon cancer Neg Hx     BP 122/82  Pulse 80  Temp(Src) 98.6 F (37 C) (Oral)  Ht 6' (1.829 m)  Wt 212 lb (96.163 kg)  BMI 28.75 kg/m2  SpO2 97%  Review of Systems He denies LOC and weight change.    Objective:   Physical Exam VITAL SIGNS:  See vs page GENERAL: no distress SKIN:  Insulin injection sites at the anterior  abdomen are normal   Lab Results  Component Value Date   HGBA1C 8.6* 01/17/2014      Assessment & Plan:  DM: he needs increased rx Obesity: this complicates the rx of DM.

## 2014-03-05 NOTE — Patient Instructions (Addendum)
Please increase the insulin to 40 units with breakfast and 20 units with the evening meal Please make a follow-up appointment in 3 months.  check your blood sugar 2 times a day.  vary the time of day when you check, between before the 3 meals, and at bedtime.  also check if you have symptoms of your blood sugar being too high or too low.  please keep a record of the readings and bring it to your next appointment here.  please call us sooner if you are having low blood sugar episodes.

## 2014-03-06 ENCOUNTER — Encounter: Payer: Self-pay | Admitting: Gastroenterology

## 2014-04-17 ENCOUNTER — Ambulatory Visit: Payer: BC Managed Care – PPO | Admitting: Endocrinology

## 2014-05-09 ENCOUNTER — Telehealth: Payer: Self-pay | Admitting: Endocrinology

## 2014-05-09 MED ORDER — INSULIN PEN NEEDLE 31G X 5 MM MISC: Status: DC

## 2014-05-09 NOTE — Telephone Encounter (Signed)
Patient asked if you would fax prescription to [facility]  he needed refill for Bd uf mini pin needles.  Thank you

## 2014-05-09 NOTE — Telephone Encounter (Signed)
Rx Faxed to pharmacy

## 2014-06-04 ENCOUNTER — Ambulatory Visit (INDEPENDENT_AMBULATORY_CARE_PROVIDER_SITE_OTHER): Payer: BC Managed Care – PPO | Admitting: Endocrinology

## 2014-06-04 ENCOUNTER — Encounter: Payer: Self-pay | Admitting: Endocrinology

## 2014-06-04 VITALS — BP 116/70 | HR 97 | Temp 98.4°F | Ht 72.0 in | Wt 211.0 lb

## 2014-06-04 DIAGNOSIS — E119 Type 2 diabetes mellitus without complications: Secondary | ICD-10-CM

## 2014-06-04 LAB — HEMOGLOBIN A1C: Hgb A1c MFr Bld: 7.1 % — ABNORMAL HIGH (ref 4.6–6.5)

## 2014-06-04 NOTE — Progress Notes (Signed)
Subjective:    Patient ID: Wayne Webb, male    DOB: 11/10/1965, 48 y.o.   MRN: [id number]  HPI Pt returns for f/u of insulin-requiring DM (dx'ed 2004, on a routine blood test; he has mild if any neuropathy of the lower extremities; he has associated retinopathy; he is willing to take insulin only as often as bid; he has never had severe hypoglycemia or DKA). pt states he feels well in general.  no cbg record, but states cbg's vary from 124-200's.  There is no trend throughout the day.  He says he never misses the insulin.   Past Medical History  Diagnosis Date  . HYPERLIPIDEMIA 03/15/2007  . GOITER, MULTINODULAR 03/21/2010  . DIABETES MELLITUS, TYPE II 03/15/2007  . ERECTILE DYSFUNCTION 03/15/2007  . Diplopia 12/24/2009  . ALLERGIC RHINITIS 03/15/2007  . CRI (chronic renal insufficiency)     CRI, creat 1.4 , renal u/s 04-2011 neg    Past Surgical History  Procedure Laterality Date  . Lasik  10-2012    History   Social History  . Marital Status: Married    Spouse Name: N/A    Number of Children: 1  . Years of Education: N/A   Occupational History  . active job Science writer   Social History Main Topics  . Smoking status: Former Smoker -- 0.50 packs/day for 10 years    Types: Cigarettes    Quit date: 10/26/1993  . Smokeless tobacco: Never Used  . Alcohol Use: No     Comment:    . Drug Use: No  . Sexual Activity: Not on file   Other Topics Concern  . Not on file   Social History Narrative   Divorced. Remarried ---     Current Outpatient Prescriptions on File Prior to Visit  Medication Sig Dispense Refill  . fluticasone (FLONASE) 50 MCG/ACT nasal spray Place 2 sprays into both nostrils daily.  16 g  10  . Insulin Aspart Prot & Aspart (NOVOLOG 70/30 MIX) (70-30) 100 UNIT/ML Pen 40 units with breakfast and 20 units with the evening meal      . Insulin Pen Needle (B-D UF III MINI PEN NEEDLES) 31G X 5 MM MISC USE AS DIRECTED 2 TIMES DAILY  180  each  0  . LEVITRA 20 MG tablet TAKE AS NEEDED AS DIRECTED  10 tablet  0  . losartan (COZAAR) 50 MG tablet TAKE 1 TABLET BY MOUTH EVERY DAY  90 tablet  2  . ONE TOUCH ULTRA TEST test strip USE 2 TIMES DAILY AS DIRECTED TO CHECK BLOOD SUGAR  50 each  0   No current facility-administered medications on file prior to visit.   No Known Allergies  Family History  Problem Relation Age of Onset  . Heart disease Mother     CABG-onset in her early 75's  . Diabetes Mother 20    had DM from her 4's  . Hypertension Mother   . Prostate cancer Father     dx in his 6s  . Stroke Neg Hx   . Colon cancer Neg Hx     BP 116/70  Pulse 97  Temp(Src) 98.4 F (36.9 C) (Oral)  Ht 6' (1.829 m)  Wt 211 lb (95.709 kg)  BMI 28.61 kg/m2  SpO2 94%  Review of Systems He has lost a few lbs.  Denies n/v.     Objective:   Physical Exam VITAL SIGNS:  See vs page GENERAL: no  distress Pulses: dorsalis pedis intact bilat.   Feet: no deformity. normal color and temp.  no edema Skin:  no ulcer on the feet.   Neuro: sensation is intact to touch on the feet.     Lab Results  Component Value Date   HGBA1C 7.1* 06/04/2014      Assessment & Plan:  DM: mild exacerbation: however, this is the best control this pt should aim for, given this regimen, which does match insulin to her changing needs throughout the day Weight loss: this helps glycemic control: he is advised to continue.     Patient is advised the following: Patient Instructions  A diabetes blood test is requested for you today.  We'll contact you with results. Please make a follow-up appointment in 3 months.   check your blood sugar 2 times a day.  vary the time of day when you check, between before the 3 meals, and at bedtime.  also check if you have symptoms of your blood sugar being too high or too low.  please keep a record of the readings and bring it to your next appointment here.  please call us sooner if you are having low blood sugar  episodes.

## 2014-06-04 NOTE — Patient Instructions (Addendum)
A diabetes blood test is requested for you today.  We'll contact you with results. Please make a follow-up appointment in 3 months.   check your blood sugar 2 times a day.  vary the time of day when you check, between before the 3 meals, and at bedtime.  also check if you have symptoms of your blood sugar being too high or too low.  please keep a record of the readings and bring it to your next appointment here.  please call us sooner if you are having low blood sugar episodes.

## 2014-07-14 ENCOUNTER — Other Ambulatory Visit: Payer: Self-pay | Admitting: Endocrinology

## 2014-07-23 ENCOUNTER — Other Ambulatory Visit: Payer: Self-pay | Admitting: Endocrinology

## 2014-08-23 ENCOUNTER — Telehealth: Payer: Self-pay | Admitting: Endocrinology

## 2014-08-23 MED ORDER — INSULIN PEN NEEDLE 31G X 5 MM MISC: Status: DC

## 2014-08-23 NOTE — Telephone Encounter (Signed)
Pt needs refills on pen needles sent [facility] 90 day supply please

## 2014-08-23 NOTE — Telephone Encounter (Signed)
Rx sent to pharmacy per pt's request.  

## 2014-09-04 ENCOUNTER — Ambulatory Visit (INDEPENDENT_AMBULATORY_CARE_PROVIDER_SITE_OTHER): Payer: BC Managed Care – PPO | Admitting: Endocrinology

## 2014-09-04 ENCOUNTER — Encounter: Payer: Self-pay | Admitting: Endocrinology

## 2014-09-04 VITALS — BP 122/86 | HR 74 | Temp 98.3°F | Ht 72.0 in | Wt 215.0 lb

## 2014-09-04 DIAGNOSIS — E042 Nontoxic multinodular goiter: Secondary | ICD-10-CM

## 2014-09-04 DIAGNOSIS — E119 Type 2 diabetes mellitus without complications: Secondary | ICD-10-CM

## 2014-09-04 LAB — HEMOGLOBIN A1C: Hgb A1c MFr Bld: 8.1 % — ABNORMAL HIGH (ref 4.6–6.5)

## 2014-09-04 LAB — TSH: TSH: 0.47 u[IU]/mL (ref 0.35–4.50)

## 2014-09-04 NOTE — Patient Instructions (Addendum)
blood tests are being requested for you today.  We'll contact you with results. It is unlikely that you would need a thyroid biopsy.  Please make a follow-up appointment in 3 months.   check your blood sugar 2 times a day.  vary the time of day when you check, between before the 3 meals, and at bedtime.  also check if you have symptoms of your blood sugar being too high or too low.  please keep a record of the readings and bring it to your next appointment here.  please call us sooner if you are having low blood sugar episodes.

## 2014-09-04 NOTE — Progress Notes (Signed)
Subjective:    Patient ID: Wayne Webb, male    DOB: 02/25/1966, 48 y.o.   MRN: [id number]  HPI  Pt returns for f/u of diabetes mellitus: DM type: Insulin-requiring type 2 Dx'ed: 0258 Complications: retinopathy Therapy: insulin since DKA: never Severe hypoglycemia: never Pancreatitis: never Other: he is willing to take insulin only as often as bid Interval history: no cbg record, but states cbg's are well-controlled.  There is no trend throughout the day.  pt states he feels well in general.  Past Medical History  Diagnosis Date  . HYPERLIPIDEMIA 03/15/2007  . GOITER, MULTINODULAR 03/21/2010  . DIABETES MELLITUS, TYPE II 03/15/2007  . ERECTILE DYSFUNCTION 03/15/2007  . Diplopia 12/24/2009  . ALLERGIC RHINITIS 03/15/2007  . CRI (chronic renal insufficiency)     CRI, creat 1.4 , renal u/s 04-2011 neg    Past Surgical History  Procedure Laterality Date  . Lasik  10-2012    History   Social History  . Marital Status: Married    Spouse Name: N/A    Number of Children: 1  . Years of Education: N/A   Occupational History  . active job Science writer   Social History Main Topics  . Smoking status: Former Smoker -- 0.50 packs/day for 10 years    Types: Cigarettes    Quit date: 10/26/1993  . Smokeless tobacco: Never Used  . Alcohol Use: No     Comment:    . Drug Use: No  . Sexual Activity: Not on file   Other Topics Concern  . Not on file   Social History Narrative   Divorced. Remarried ---     Current Outpatient Prescriptions on File Prior to Visit  Medication Sig Dispense Refill  . fluticasone (FLONASE) 50 MCG/ACT nasal spray Place 2 sprays into both nostrils daily. 16 g 10  . Insulin Aspart Prot & Aspart (NOVOLOG 70/30 MIX) (70-30) 100 UNIT/ML Pen 45 units with breakfast and 20 units with the evening meal    . Insulin Pen Needle (B-D UF III MINI PEN NEEDLES) 31G X 5 MM MISC USE AS DIRECTED 2 TIMES DAILY 200 each 0  . LEVITRA 20 MG tablet  TAKE AS NEEDED AS DIRECTED 10 tablet 0  . losartan (COZAAR) 50 MG tablet TAKE 1 TABLET BY MOUTH EVERY DAY 90 tablet 2  . ONE TOUCH ULTRA TEST test strip USE 2 TIMES DAILY AS DIRECTED TO CHECK BLOOD SUGAR 50 each 0  . ONE TOUCH ULTRA TEST test strip USE 2 TIMES DAILY AS DIRECTED TO CHECK BLOOD SUGAR 50 each 0  . ONE TOUCH ULTRA TEST test strip USE 2 TIMES DAILY AS DIRECTED TO CHECK BLOOD SUGAR 150 each 2   No current facility-administered medications on file prior to visit.    No Known Allergies  Family History  Problem Relation Age of Onset  . Heart disease Mother     CABG-onset in her early 83's  . Diabetes Mother 31    had DM from her 110's  . Hypertension Mother   . Prostate cancer Father     dx in his 76s  . Stroke Neg Hx   . Colon cancer Neg Hx     BP 122/86 mmHg  Pulse 74  Temp(Src) 98.3 F (36.8 C) (Oral)  Ht 6' (1.829 m)  Wt 215 lb (97.523 kg)  BMI 29.15 kg/m2  SpO2 93%    Review of Systems He denies hypoglycemia and weight change.  Objective:   Physical Exam VITAL SIGNS:  See vs page GENERAL: no distress Pulses: dorsalis pedis intact bilat.   Feet: no deformity.  no edema Skin:  no ulcer on the feet.  normal color and temp. Neuro: sensation is intact to touch on the feet.    Lab Results  Component Value Date   HGBA1C 8.1* 09/04/2014   i reviewed thyroid US result.    Assessment & Plan:  DM: moderate exacerbation, worse today.  Multinodular goiter: this borderline low TSH strongly suggests hyperfunctioning nodules, so he does not need bx.    Patient is advised the following: Patient Instructions  blood tests are being requested for you today.  We'll contact you with results. It is unlikely that you would need a thyroid biopsy.  Please make a follow-up appointment in 3 months.   check your blood sugar 2 times a day.  vary the time of day when you check, between before the 3 meals, and at bedtime.  also check if you have symptoms of your blood  sugar being too high or too low.  please keep a record of the readings and bring it to your next appointment here.  please call us sooner if you are having low blood sugar episodes.       addendum: Please increase the insulin to 45 units with breakfast and 20 units with the evening meal.

## 2014-11-21 ENCOUNTER — Telehealth: Payer: Self-pay | Admitting: Endocrinology

## 2014-11-21 MED ORDER — GLUCOSE BLOOD VI STRP: ORAL_STRIP | Status: DC

## 2014-11-21 MED ORDER — INSULIN ASPART PROT & ASPART (70-30 MIX) 100 UNIT/ML PEN: PEN_INJECTOR | SUBCUTANEOUS | Status: DC

## 2014-11-21 NOTE — Telephone Encounter (Signed)
Rx sent to pharmacy   

## 2014-11-21 NOTE — Telephone Encounter (Signed)
Patient called stating that his insurance had changed and he is no longer doing mailorder   He would like all of his diabetic medications sent to [facility]: [phone]  Thank you

## 2014-11-22 ENCOUNTER — Telehealth: Payer: Self-pay | Admitting: Endocrinology

## 2014-11-22 NOTE — Telephone Encounter (Signed)
Pt calling regarding the novolog it is too high. He needs to switch to humalog it is cheaper.

## 2014-11-23 MED ORDER — INSULIN LISPRO PROT & LISPRO (75-25 MIX) 100 UNIT/ML KWIKPEN: PEN_INJECTOR | SUBCUTANEOUS | Status: DC

## 2014-11-23 NOTE — Telephone Encounter (Signed)
See below. Ok to change insulin? Thanks!

## 2014-11-23 NOTE — Telephone Encounter (Signed)
ok 

## 2014-11-23 NOTE — Telephone Encounter (Signed)
Humalog 75/25 sent to pt's pharmacy.

## 2014-11-26 ENCOUNTER — Telehealth: Payer: Self-pay | Admitting: Endocrinology

## 2014-11-26 MED ORDER — INSULIN PEN NEEDLE 31G X 5 MM MISC: Status: DC

## 2014-11-26 NOTE — Telephone Encounter (Signed)
Patient would like to have his pen needles called in   [place]    Thank you

## 2014-11-26 NOTE — Telephone Encounter (Signed)
Rx sent to pharmacy   

## 2014-11-27 ENCOUNTER — Other Ambulatory Visit: Payer: Self-pay

## 2014-11-27 MED ORDER — INSULIN LISPRO PROT & LISPRO (75-25 MIX) 100 UNIT/ML KWIKPEN: PEN_INJECTOR | SUBCUTANEOUS | Status: DC

## 2014-12-11 ENCOUNTER — Ambulatory Visit: Payer: Self-pay | Admitting: Endocrinology

## 2015-01-01 LAB — HM DIABETES EYE EXAM

## 2015-01-21 ENCOUNTER — Other Ambulatory Visit: Payer: Self-pay | Admitting: Endocrinology

## 2015-01-29 ENCOUNTER — Telehealth: Payer: Self-pay | Admitting: Internal Medicine

## 2015-01-29 NOTE — Telephone Encounter (Signed)
Pre Visit letter sent  °

## 2015-01-30 ENCOUNTER — Other Ambulatory Visit: Payer: Self-pay | Admitting: Internal Medicine

## 2015-01-30 ENCOUNTER — Other Ambulatory Visit: Payer: Self-pay

## 2015-02-06 NOTE — Telephone Encounter (Signed)
error 

## 2015-02-13 ENCOUNTER — Encounter: Payer: Self-pay | Admitting: Endocrinology

## 2015-02-14 ENCOUNTER — Telehealth: Payer: Self-pay

## 2015-02-14 NOTE — Telephone Encounter (Signed)
Patient returned phone call. [facility] # [phone]

## 2015-02-14 NOTE — Telephone Encounter (Signed)
LMOVM

## 2015-02-15 ENCOUNTER — Ambulatory Visit (INDEPENDENT_AMBULATORY_CARE_PROVIDER_SITE_OTHER): Payer: 59 | Admitting: Internal Medicine

## 2015-02-15 ENCOUNTER — Ambulatory Visit (HOSPITAL_BASED_OUTPATIENT_CLINIC_OR_DEPARTMENT_OTHER)
Admission: RE | Admit: 2015-02-15 | Discharge: 2015-02-15 | Disposition: A | Discharge status: Home / Self Care | Payer: 59 | Source: Ambulatory Visit | Attending: Internal Medicine | Admitting: Internal Medicine

## 2015-02-15 ENCOUNTER — Encounter: Payer: Self-pay | Admitting: Internal Medicine

## 2015-02-15 VITALS — BP 126/68 | HR 80 | Temp 97.8°F | Ht 72.0 in | Wt 221.0 lb

## 2015-02-15 DIAGNOSIS — J398 Other specified diseases of upper respiratory tract: Secondary | ICD-10-CM | POA: Diagnosis not present

## 2015-02-15 DIAGNOSIS — Z0000 Encounter for general adult medical examination without abnormal findings: Secondary | ICD-10-CM | POA: Diagnosis not present

## 2015-02-15 DIAGNOSIS — R9389 Abnormal findings on diagnostic imaging of other specified body structures: Secondary | ICD-10-CM

## 2015-02-15 DIAGNOSIS — Z23 Encounter for immunization: Secondary | ICD-10-CM | POA: Diagnosis not present

## 2015-02-15 DIAGNOSIS — R938 Abnormal findings on diagnostic imaging of other specified body structures: Secondary | ICD-10-CM | POA: Diagnosis present

## 2015-02-15 DIAGNOSIS — R945 Abnormal results of liver function studies: Secondary | ICD-10-CM

## 2015-02-15 DIAGNOSIS — R7989 Other specified abnormal findings of blood chemistry: Secondary | ICD-10-CM

## 2015-02-15 MED ORDER — AZELASTINE HCL 0.1 % NA SOLN: 2.0000 | Freq: Every evening | NASAL | Status: DC | PRN

## 2015-02-15 NOTE — Assessment & Plan Note (Addendum)
Ultrasound 2015 negative, previous hepatitis serologies negative. Ferritin last year slightly elevated Plan: Recheck transferrin saturation, ANA, ceruloplasmin, anti smooth muscles ab. Alpha 1 antitrypsin

## 2015-02-15 NOTE — Progress Notes (Signed)
Pre visit review using our clinic review tool, if applicable. No additional management support is needed unless otherwise documented below in the visit note. 

## 2015-02-15 NOTE — Assessment & Plan Note (Addendum)
History of abnormal chest x-ray  PFTs  2013 moderate restriction, normal spirometry, diffusion moderatelly reduced. Currently essentially asymptomatic. Plan: Recheck chest x-ray, PFTs, depending on results refer him back to pulmonary

## 2015-02-15 NOTE — Assessment & Plan Note (Addendum)
Td 2016 pneumonia shot 23 ----> 2007 prevnar ----- today flu shot  @ his job  +family history of prostate cancer , DRE normal w, PSA today.    Colonoscopy, 02/2014, 1 polyp, next 10 years Labs Diet and exercise discussed  Other issues Diabetes, per endocrinology, patient request a A1c which will be done, encouraged to make an appointment to see Dr. Loanne Drilling Hypertension, good compliance of medication, BP today very good

## 2015-02-15 NOTE — Progress Notes (Signed)
Subjective:    Patient ID: Wayne Webb, male    DOB: May 27, 1966, 49 y.o.   MRN: [id number]  DOS:  02/15/2015 Type of visit - description : cpx Interval history: No major concerns   Review of Systems  Constitutional: No fever, chills. No unexplained wt changes. No unusual sweats HEENT: No dental problems, ear discharge, facial swelling, voice changes. No eye discharge, redness or intolerance to light Respiratory: No wheezing or difficulty breathing. No DOE; + cough mostly in the mornings, just a few times and then his clear the rest of today, thinks related to postnasal dripping  Cardiovascular: No CP, leg swelling or palpitations GI: no nausea, vomiting, diarrhea or abdominal pain.  No blood in the stools. No dysphagia   Endocrine: No polyphagia, polyuria or polydipsia GU: No dysuria, gross hematuria, difficulty urinating. No urinary urgency or frequency. Musculoskeletal: No joint swellings or unusual aches or pains Skin: No change in the color of the skin, palor or rash Allergic, immunologic: No environmental allergies or food allergies Neurological: No dizziness or syncope. No headaches. No diplopia, slurred speech, motor deficits, facial numbness Hematological: No enlarged lymph nodes, easy bruising or bleeding Psychiatry: No suicidal ideas, hallucinations, behavior problems or confusion. No unusual/severe anxiety or depression.    Past Medical History  Diagnosis Date  . HYPERLIPIDEMIA 03/15/2007  . GOITER, MULTINODULAR 03/21/2010  . DIABETES MELLITUS, TYPE II 03/15/2007  . ERECTILE DYSFUNCTION 03/15/2007  . Diplopia 12/24/2009  . ALLERGIC RHINITIS 03/15/2007  . CRI (chronic renal insufficiency)     CRI, creat 1.4 , renal u/s 04-2011 neg    Past Surgical History  Procedure Laterality Date  . Lasik  10-2012    History   Social History  . Marital Status: Married    Spouse Name: N/A  . Number of Children: 1  . Years of Education: N/A   Occupational History  .  active job Science writer   Social History Main Topics  . Smoking status: Former Smoker -- 0.50 packs/day for 10 years    Types: Cigarettes    Quit date: 10/26/1993  . Smokeless tobacco: Never Used  . Alcohol Use: No     Comment:    . Drug Use: No  . Sexual Activity: Not on file   Other Topics Concern  . Not on file   Social History Narrative   Divorced. Remarried --- pt , wife, wife's daughter      Family History  Problem Relation Age of Onset  . Heart disease Mother     CABG-onset in her early 33's  . Diabetes Mother 5    had DM from her 10's  . Hypertension Mother   . Prostate cancer Father     dx in his 33s  . Stroke Neg Hx   . Colon cancer Neg Hx        Medication List       This list is accurate as of: 02/15/15 11:59 PM.  Always use your most recent med list.               azelastine 0.1 % nasal spray  Commonly known as:  ASTELIN  Place 2 sprays into both nostrils at bedtime as needed for rhinitis. Use in each nostril as directed     B-12 PO  Take 1 tablet by mouth daily. OTC     CREATINE PO  Take 1 scoop by mouth daily as needed. Before going to gym  fluticasone 50 MCG/ACT nasal spray  Commonly known as:  FLONASE  PLACE 2 SPRAYS INTO BOTH NOSTRILS EVERY DAY     Insulin Lispro Prot & Lispro (75-25) 100 UNIT/ML Kwikpen  Commonly known as:  HUMALOG MIX 75/25 KWIKPEN  Inject 45 units with breakfast and 20 units with his evening meal.     Insulin Pen Needle 31G X 5 MM Misc  Commonly known as:  B-D UF III MINI PEN NEEDLES  USE AS DIRECTED 2 TIMES DAILY     LEVITRA 20 MG tablet  Generic drug:  vardenafil  TAKE AS NEEDED AS DIRECTED     losartan 50 MG tablet  Commonly known as:  COZAAR  Take 1 tablet (50 mg total) by mouth daily.     ONE TOUCH ULTRA TEST test strip  Generic drug:  glucose blood  USE 2 TIMES DAILY AS DIRECTED TO CHECK BLOOD SUGAR     glucose blood test strip  Commonly known as:  ONE TOUCH ULTRA TEST    USE 2 TIMES DAILY AS DIRECTED TO CHECK BLOOD SUGAR           Objective:   Physical Exam BP 126/68 mmHg  Pulse 80  Temp(Src) 97.8 F (36.6 C) (Oral)  Ht 6' (1.829 m)  Wt 221 lb (100.245 kg)  BMI 29.97 kg/m2  SpO2 95%  General:   Well developed, well nourished . NAD.  HEENT:  Normocephalic . Face symmetric, atraumatic, nose is slightly congested Lungs:  CTA B Normal respiratory effort, no intercostal retractions, no accessory muscle use. Heart: RRR,  no murmur.  Abdomen:  Not distended, soft, non-tender. No rebound or rigidity. No mass,organomegaly Rectal:  External abnormalities: none. Normal sphincter tone. No rectal masses or tenderness.  No stools found Prostate: Prostate gland firm and smooth, no enlargement, nodularity, tenderness, mass, asymmetry or induration.  Muscle skeletal: no pretibial edema bilaterally  Skin: Not pale. Not jaundice Neurologic:  alert & oriented X3.  Speech normal, gait appropriate for age and unassisted Psych--  Cognition and judgment appear intact.  Cooperative with normal attention span and concentration.  Behavior appropriate. No anxious or depressed appearing.       Assessment & Plan:

## 2015-02-15 NOTE — Patient Instructions (Signed)
  Stop by the first floor and get the XR   Please schedule labs to be done within few days (fasting)  Come back to the office in 6 months   for a routine check up

## 2015-02-18 ENCOUNTER — Other Ambulatory Visit: Payer: 59

## 2015-02-19 ENCOUNTER — Other Ambulatory Visit (INDEPENDENT_AMBULATORY_CARE_PROVIDER_SITE_OTHER): Payer: 59

## 2015-02-19 DIAGNOSIS — R7989 Other specified abnormal findings of blood chemistry: Secondary | ICD-10-CM

## 2015-02-19 DIAGNOSIS — R9389 Abnormal findings on diagnostic imaging of other specified body structures: Secondary | ICD-10-CM

## 2015-02-19 DIAGNOSIS — R945 Abnormal results of liver function studies: Secondary | ICD-10-CM

## 2015-02-19 DIAGNOSIS — Z0000 Encounter for general adult medical examination without abnormal findings: Secondary | ICD-10-CM | POA: Diagnosis not present

## 2015-02-19 LAB — LIPID PANEL
Cholesterol: 185 mg/dL (ref 0–200)
HDL: 41.6 mg/dL (ref >39.00)
LDL Cholesterol: 127 mg/dL — ABNORMAL HIGH (ref 0–99)
NonHDL: 143.4
Total CHOL/HDL Ratio: 4
Triglycerides: 80 mg/dL (ref 0.0–149.0)
VLDL: 16 mg/dL (ref 0.0–40.0)

## 2015-02-19 LAB — HEMOGLOBIN A1C: Hgb A1c MFr Bld: 6.9 % — ABNORMAL HIGH (ref 4.6–6.5)

## 2015-02-19 LAB — COMPREHENSIVE METABOLIC PANEL
ALT: 64 U/L — ABNORMAL HIGH (ref 0–53)
AST: 56 U/L — ABNORMAL HIGH (ref 0–37)
Albumin: 4.6 g/dL (ref 3.5–5.2)
Alkaline Phosphatase: 112 U/L (ref 39–117)
BUN: 26 mg/dL — ABNORMAL HIGH (ref 6–23)
CO2: 27 mEq/L (ref 19–32)
Calcium: 10.2 mg/dL (ref 8.4–10.5)
Chloride: 102 mEq/L (ref 96–112)
Creatinine, Ser: 1.52 mg/dL — ABNORMAL HIGH (ref 0.40–1.50)
GFR: 62.49 mL/min (ref >60.00)
Glucose, Bld: 160 mg/dL — ABNORMAL HIGH (ref 70–99)
Potassium: 4 mEq/L (ref 3.5–5.1)
Sodium: 136 mEq/L (ref 135–145)
Total Bilirubin: 1.6 mg/dL — ABNORMAL HIGH (ref 0.2–1.2)
Total Protein: 8 g/dL (ref 6.0–8.3)

## 2015-02-19 LAB — PSA: PSA: 1.55 ng/mL (ref 0.10–4.00)

## 2015-02-19 LAB — CBC WITH DIFFERENTIAL/PLATELET
Basophils Relative: 0 % (ref 0.0–3.0)
Eosinophils Relative: 7 % — ABNORMAL HIGH (ref 0.0–5.0)
HCT: 39.8 % (ref 39.0–52.0)
Hemoglobin: 13.4 g/dL (ref 13.0–17.0)
Lymphocytes Relative: 27 % (ref 12.0–46.0)
MCHC: 33.8 g/dL (ref 30.0–36.0)
MCV: 85.3 fl (ref 78.0–100.0)
Monocytes Relative: 15 % — ABNORMAL HIGH (ref 3.0–12.0)
Neutrophils Relative %: 51 % (ref 43.0–77.0)
Platelets: 214 10*3/uL (ref 150.0–400.0)
RBC: 4.66 Mil/uL (ref 4.22–5.81)
RDW: 12.3 % (ref 11.5–15.5)
WBC: 3.6 10*3/uL — ABNORMAL LOW (ref 4.0–10.5)

## 2015-02-19 LAB — IBC PANEL
Iron: 150 ug/dL (ref 42–165)
Saturation Ratios: 40.1 % (ref 20.0–50.0)
Transferrin: 267 mg/dL (ref 212.0–360.0)

## 2015-02-20 LAB — HIV ANTIBODY (ROUTINE TESTING W REFLEX): HIV 1&2 Ab, 4th Generation: NONREACTIVE

## 2015-02-20 LAB — ANA: Anti Nuclear Antibody(ANA): NEGATIVE

## 2015-02-21 LAB — ALPHA-1-ANTITRYPSIN: A-1 Antitrypsin, Ser: 135 mg/dL (ref 83–199)

## 2015-02-21 LAB — CERULOPLASMIN: Ceruloplasmin: 35 mg/dL (ref 18–36)

## 2015-02-22 ENCOUNTER — Telehealth: Payer: Self-pay | Admitting: Internal Medicine

## 2015-02-22 DIAGNOSIS — R748 Abnormal levels of other serum enzymes: Secondary | ICD-10-CM

## 2015-02-22 DIAGNOSIS — R9389 Abnormal findings on diagnostic imaging of other specified body structures: Secondary | ICD-10-CM

## 2015-02-22 NOTE — Telephone Encounter (Signed)
Advise patient: Chest x-ray remains abnormal, please arrange a referral to pulmonary, has seen Dr. Melvyn Novas before Liver tests remain elevated, etiology not clear, please arrange a referral to GI A1c 6.9, recommend to see endocrinology as recommended Cholesterol is slightly elevated, will recheck from time to time Kidney function slightly decreased, will reassess when he comes back in 6 months

## 2015-02-22 NOTE — Telephone Encounter (Signed)
LMOM informing Pt to return call.  

## 2015-02-26 NOTE — Telephone Encounter (Signed)
Unable to contact Pt, please advise.

## 2015-02-27 NOTE — Telephone Encounter (Signed)
Please enter the referrals and send a letter with my recommendations

## 2015-02-27 NOTE — Telephone Encounter (Signed)
Referrals placed, Letter printed and mailed to Pt along with lab results and x-ray results from 02/15/2015.

## 2015-03-11 ENCOUNTER — Ambulatory Visit (INDEPENDENT_AMBULATORY_CARE_PROVIDER_SITE_OTHER): Payer: 59 | Admitting: Endocrinology

## 2015-03-11 ENCOUNTER — Encounter: Payer: Self-pay | Admitting: Endocrinology

## 2015-03-11 VITALS — BP 122/70 | HR 103 | Temp 98.3°F | Ht 72.0 in | Wt 218.0 lb

## 2015-03-11 DIAGNOSIS — E042 Nontoxic multinodular goiter: Secondary | ICD-10-CM

## 2015-03-11 DIAGNOSIS — E11329 Type 2 diabetes mellitus with mild nonproliferative diabetic retinopathy without macular edema: Secondary | ICD-10-CM | POA: Diagnosis not present

## 2015-03-11 DIAGNOSIS — E113299 Type 2 diabetes mellitus with mild nonproliferative diabetic retinopathy without macular edema, unspecified eye: Secondary | ICD-10-CM

## 2015-03-11 LAB — TSH: TSH: 0.5 u[IU]/mL (ref 0.35–4.50)

## 2015-03-11 NOTE — Patient Instructions (Addendum)
blood tests are being requested for you today.  We'll contact you with results. If the thyroid is overactive, we should check a nuclear medicine scan.  If it is normal, let's recheck the ultrasound.   Please reduce the insulin to 45 units with breakfast and 20 units with his evening meal. Please make a follow-up appointment in 3 months.   check your blood sugar 2 times a day.  vary the time of day when you check, between before the 3 meals, and at bedtime.  also check if you have symptoms of your blood sugar being too high or too low.  please keep a record of the readings and bring it to your next appointment here.  please call us sooner if you are having low blood sugar episodes.

## 2015-03-11 NOTE — Progress Notes (Signed)
Subjective:    Patient ID: Wayne Webb, male    DOB: 02/22/1966, 49 y.o.   MRN: [id number]  HPI Pt returns for f/u of diabetes mellitus: DM type: Insulin-requiring type 2 Dx'ed: 9798 Complications: retinopathy Therapy: insulin since 2011 DKA: never Severe hypoglycemia: never Pancreatitis: never Other: he is willing to take insulin only as often as bid Interval history: no cbg record, but states cbg's are well-controlled.  There is no trend throughout the day.  pt states he feels well in general.  He takes 45 units with breakfast and 25 units with his evening meal.  Past Medical History  Diagnosis Date  . HYPERLIPIDEMIA 03/15/2007  . GOITER, MULTINODULAR 03/21/2010  . DIABETES MELLITUS, TYPE II 03/15/2007  . ERECTILE DYSFUNCTION 03/15/2007  . Diplopia 12/24/2009  . ALLERGIC RHINITIS 03/15/2007  . CRI (chronic renal insufficiency)     CRI, creat 1.4 , renal u/s 04-2011 neg    Past Surgical History  Procedure Laterality Date  . Lasik  10-2012    History   Social History  . Marital Status: Married    Spouse Name: N/A  . Number of Children: 1  . Years of Education: N/A   Occupational History  . active job Science writer   Social History Main Topics  . Smoking status: Former Smoker -- 0.50 packs/day for 10 years    Types: Cigarettes    Quit date: 10/26/1993  . Smokeless tobacco: Never Used  . Alcohol Use: No     Comment:    . Drug Use: No  . Sexual Activity: Not on file   Other Topics Concern  . Not on file   Social History Narrative   Divorced. Remarried --- pt , wife, wife's daughter     Current Outpatient Prescriptions on File Prior to Visit  Medication Sig Dispense Refill  . azelastine (ASTELIN) 0.1 % nasal spray Place 2 sprays into both nostrils at bedtime as needed for rhinitis. Use in each nostril as directed 30 mL 3  . CREATINE PO Take 1 scoop by mouth daily as needed. Before going to gym    . Cyanocobalamin (B-12 PO) Take 1  tablet by mouth daily. OTC    . fluticasone (FLONASE) 50 MCG/ACT nasal spray PLACE 2 SPRAYS INTO BOTH NOSTRILS EVERY DAY 16 g 0  . glucose blood (ONE TOUCH ULTRA TEST) test strip USE 2 TIMES DAILY AS DIRECTED TO CHECK BLOOD SUGAR 150 each 2  . Insulin Pen Needle (B-D UF III MINI PEN NEEDLES) 31G X 5 MM MISC USE AS DIRECTED 2 TIMES DAILY 200 each 0  . LEVITRA 20 MG tablet TAKE AS NEEDED AS DIRECTED 10 tablet 0  . losartan (COZAAR) 50 MG tablet Take 1 tablet (50 mg total) by mouth daily. 90 tablet 0  . ONE TOUCH ULTRA TEST test strip USE 2 TIMES DAILY AS DIRECTED TO CHECK BLOOD SUGAR 50 each 0   No current facility-administered medications on file prior to visit.    No Known Allergies  Family History  Problem Relation Age of Onset  . Heart disease Mother     CABG-onset in her early 39's  . Diabetes Mother 28    had DM from her 97's  . Hypertension Mother   . Prostate cancer Father     dx in his 48s  . Stroke Neg Hx   . Colon cancer Neg Hx     BP 122/70 mmHg  Pulse 103  Temp(Src) 98.3  F (36.8 C) (Oral)  Ht 6' (1.829 m)  Wt 218 lb (98.884 kg)  BMI 29.56 kg/m2  SpO2 94%    Review of Systems He denies hypoglycemia    Objective:   Physical Exam VITAL SIGNS:  See vs page GENERAL: no distress Neck: thyroid is slightly enlarged, with a slightly irregular surface.  i cannot feel the nodule, though. Pulses: dorsalis pedis intact bilat.   MSK: no deformity of the feet CV: no leg edema Skin:  no ulcer on the feet.  normal color and temp on the feet. Neuro: sensation is intact to touch on the feet    Lab Results  Component Value Date   HGBA1C 6.9* 02/19/2015   Lab Results  Component Value Date   TSH 0.50 03/11/2015  i personally reviewed electrocardiogram tracing: 01/03/14: normal  i reviewed radiol report (thyroid US: 01/03/14)    Assessment & Plan:  Nodular goiter: still euthyroid DM: overcontrolled, given this regimen, which does match insulin to his changing  needs throughout the day.  Patient is advised the following: Patient Instructions  blood tests are being requested for you today.  We'll contact you with results. If the thyroid is overactive, we should check a nuclear medicine scan.  If it is normal, let's recheck the ultrasound.   Please reduce the insulin to 45 units with breakfast and 20 units with his evening meal. Please make a follow-up appointment in 3 months.   check your blood sugar 2 times a day.  vary the time of day when you check, between before the 3 meals, and at bedtime.  also check if you have symptoms of your blood sugar being too high or too low.  please keep a record of the readings and bring it to your next appointment here.  please call us sooner if you are having low blood sugar episodes.       Addendum: we'll recheck the ultrasound.

## 2015-03-13 ENCOUNTER — Other Ambulatory Visit: Payer: Self-pay | Admitting: Endocrinology

## 2015-03-20 ENCOUNTER — Ambulatory Visit
Admission: RE | Admit: 2015-03-20 | Discharge: 2015-03-20 | Disposition: A | Discharge status: Home / Self Care | Payer: 59 | Source: Ambulatory Visit | Attending: Endocrinology | Admitting: Endocrinology

## 2015-03-20 DIAGNOSIS — E042 Nontoxic multinodular goiter: Secondary | ICD-10-CM

## 2015-04-05 ENCOUNTER — Other Ambulatory Visit: Payer: Self-pay | Admitting: Internal Medicine

## 2015-04-05 ENCOUNTER — Telehealth: Payer: Self-pay | Admitting: Endocrinology

## 2015-04-05 ENCOUNTER — Ambulatory Visit (INDEPENDENT_AMBULATORY_CARE_PROVIDER_SITE_OTHER): Payer: 59 | Admitting: Internal Medicine

## 2015-04-05 ENCOUNTER — Encounter: Payer: Self-pay | Admitting: Internal Medicine

## 2015-04-05 ENCOUNTER — Other Ambulatory Visit (INDEPENDENT_AMBULATORY_CARE_PROVIDER_SITE_OTHER): Payer: 59

## 2015-04-05 VITALS — BP 114/72 | HR 80 | Ht 73.0 in | Wt 223.0 lb

## 2015-04-05 DIAGNOSIS — R053 Chronic cough: Secondary | ICD-10-CM | POA: Insufficient documentation

## 2015-04-05 DIAGNOSIS — R0609 Other forms of dyspnea: Secondary | ICD-10-CM

## 2015-04-05 DIAGNOSIS — I10 Essential (primary) hypertension: Secondary | ICD-10-CM | POA: Diagnosis not present

## 2015-04-05 DIAGNOSIS — R938 Abnormal findings on diagnostic imaging of other specified body structures: Secondary | ICD-10-CM

## 2015-04-05 DIAGNOSIS — R0600 Dyspnea, unspecified: Secondary | ICD-10-CM

## 2015-04-05 DIAGNOSIS — R9389 Abnormal findings on diagnostic imaging of other specified body structures: Secondary | ICD-10-CM

## 2015-04-05 DIAGNOSIS — R05 Cough: Secondary | ICD-10-CM | POA: Diagnosis not present

## 2015-04-05 LAB — PULMONARY FUNCTION TEST
DL/VA % pred: 99 %
DL/VA: 4.76 ml/min/mmHg/L
DLCO unc % pred: 53 %
DLCO unc: 19.36 ml/min/mmHg
FEF 25-75 Post: 1.38 L/sec
FEF 25-75 Pre: 1.16 L/sec
FEF2575-%Change-Post: 19 %
FEF2575-%Pred-Post: 38 %
FEF2575-%Pred-Pre: 32 %
FEV1-%Change-Post: 5 %
FEV1-%Pred-Post: 52 %
FEV1-%Pred-Pre: 49 %
FEV1-Post: 1.95 L
FEV1-Pre: 1.84 L
FEV1FVC-%Change-Post: 2 %
FEV1FVC-%Pred-Pre: 87 %
FEV6-%Change-Post: 2 %
FEV6-%Pred-Post: 59 %
FEV6-%Pred-Pre: 58 %
FEV6-Post: 2.69 L
FEV6-Pre: 2.64 L
FEV6FVC-%Change-Post: 0 %
FEV6FVC-%Pred-Post: 102 %
FEV6FVC-%Pred-Pre: 103 %
FVC-%Change-Post: 3 %
FVC-%Pred-Post: 58 %
FVC-%Pred-Pre: 56 %
FVC-Post: 2.72 L
FVC-Pre: 2.64 L
Post FEV1/FVC ratio: 72 %
Post FEV6/FVC ratio: 100 %
Pre FEV1/FVC ratio: 70 %
Pre FEV6/FVC Ratio: 100 %
RV % pred: 52 %
RV: 1.18 L
TLC % pred: 51 %
TLC: 3.86 L

## 2015-04-05 LAB — CBC WITH DIFFERENTIAL/PLATELET
Basophils Absolute: 0 10*3/uL (ref 0.0–0.1)
Basophils Relative: 0.6 % (ref 0.0–3.0)
Eosinophils Absolute: 0.2 10*3/uL (ref 0.0–0.7)
Eosinophils Relative: 4 % (ref 0.0–5.0)
HCT: 39.7 % (ref 39.0–52.0)
Hemoglobin: 13.4 g/dL (ref 13.0–17.0)
Lymphocytes Relative: 15.6 % (ref 12.0–46.0)
Lymphs Abs: 0.8 10*3/uL (ref 0.7–4.0)
MCHC: 33.8 g/dL (ref 30.0–36.0)
MCV: 86.2 fl (ref 78.0–100.0)
Monocytes Absolute: 0.8 10*3/uL (ref 0.1–1.0)
Monocytes Relative: 17 % — ABNORMAL HIGH (ref 3.0–12.0)
Neutro Abs: 3 10*3/uL (ref 1.4–7.7)
Neutrophils Relative %: 62.8 % (ref 43.0–77.0)
Platelets: 202 10*3/uL (ref 150.0–400.0)
RBC: 4.61 Mil/uL (ref 4.22–5.81)
RDW: 12.3 % (ref 11.5–15.5)
WBC: 4.8 10*3/uL (ref 4.0–10.5)

## 2015-04-05 MED ORDER — VALSARTAN 80 MG PO TABS: 80.0000 mg | ORAL_TABLET | Freq: Every day | ORAL | Status: DC

## 2015-04-05 NOTE — Assessment & Plan Note (Signed)
-   Evaluated by Dr Annamaria Boots in 1999  Discussed with DR Annamaria Boots, does not have previous w/u or cxr's on hand so rec repeat CT > done

## 2015-04-05 NOTE — Telephone Encounter (Signed)
Patient called to speak with Jinny Blossom or Dr. Loanne Drilling regarding his ultrasound  I advised Jinny Blossom was out of the office till Monday  Patient will be signing up for Mychart to discuss with Dr. Loanne Drilling via Delor[facility] Ping   Thank you

## 2015-04-05 NOTE — Progress Notes (Signed)
PFT done today. 

## 2015-04-05 NOTE — Patient Instructions (Addendum)
Stop losartan and start valsartan 80 mg one daily   GERD (REFLUX)  is an extremely common cause of respiratory symptoms just like yours , many times with no obvious heartburn at all.    It can be treated with medication, but also with lifestyle changes including avoidance of late meals, elevation of the head of your bed (ideally with 6 inch  bed blocks) excessive alcohol, smoking cessation, and avoid fatty foods, chocolate, peppermint, colas, red wine, and acidic juices such as orange juice.  NO MINT OR MENTHOL PRODUCTS SO NO COUGH DROPS  USE SUGARLESS CANDY INSTEAD (Jolley ranchers or Stover's or Life Savers) or even ice chips will also do - the key is to swallow to prevent all throat clearing. NO OIL BASED VITAMINS - use powdered substitutes.  Please see patient coordinator before you leave today  to schedule CT chest without contrast  and sinus CT same place  Please remember to go to the lab  department downstairs for your tests - we will call you with the results when they are available.

## 2015-04-05 NOTE — Progress Notes (Signed)
   Subjective:    Patient ID: Wayne Webb, male    DOB: 1966-10-25, 49 y.o.   MRN: [id number]  HPI  24 yobm quit smoking 1993 no breathing problem but did have bad pnds eval by ENT > surgery [place] early  90s > much better then worse again since moved to [place] with recurrent cough so referred by Dr Larose Kells for cough 04/05/2015    04/05/2015 1st [facility] office visit/ Ladasia Sircy   Chief Complaint  Patient presents with  . Pulmonary Consult    Referred by Dr. Larose Kells. Pt c/o cough x 3 yrs- prod in the am with light brown sputum.   cough worse first thing am/ best p sinus surgery - does not wake him prematurely, variably productive year round Not limited by breathing from desired activities  No obvious pattern in day to day or daytime variabilty or assoc   cp or chest tightness, subjective wheeze overt  hb symptoms. No unusual exp hx or h/o childhood pna/ asthma or knowledge of premature birth.  Sleeping ok without nocturnal  or early am exacerbation  of respiratory  c/o's or need for noct saba. Also denies any obvious fluctuation of symptoms with weather or environmental changes or other aggravating or alleviating factors except as outlined above   Current Medications, Allergies, Complete Past Medical History, Past Surgical History, Family History, and Social History were reviewed in [employer] record.     Review of Systems  Constitutional: Negative for fever, chills, activity change, appetite change and unexpected weight change.  HENT: Positive for trouble swallowing. Negative for congestion, dental problem, postnasal drip, rhinorrhea, sneezing, sore throat and voice change.   Eyes: Negative for visual disturbance.  Respiratory: Positive for cough. Negative for choking and shortness of breath.   Cardiovascular: Negative for chest pain and leg swelling.  Gastrointestinal: Negative for nausea, vomiting and abdominal pain.  Genitourinary: Negative for  difficulty urinating.  Musculoskeletal: Negative for arthralgias.  Skin: Negative for rash.  Psychiatric/Behavioral: Negative for behavioral problems and confusion.       Objective:   Physical Exam  amb wm slt nasal tone to voice   Wt Readings from Last 3 Encounters:  04/05/15 223 lb (101.152 kg)  03/11/15 218 lb (98.884 kg)  02/15/15 221 lb (100.245 kg)    Vital signs reviewed   HEENT: nl dentition, turbinates, and orophanx. Nl external ear canals without cough reflex   NECK :  without JVD/Nodes/TM/ nl carotid upstrokes bilaterally   LUNGS: no acc muscle use, clear to A and P bilaterally without cough on insp or exp maneuvers   CV:  RRR  no s3 or murmur or increase in P2, no edema   ABD:  soft and nontender with nl excursion in the supine position. No bruits or organomegaly, bowel sounds nl  MS:  warm without deformities, calf tenderness, cyanosis or clubbing  SKIN: warm and dry without lesions    NEURO:  alert, approp, no deficits      I personally reviewed images and agree with radiology impression as follows:  CXR: 02/15/15 Right paratracheal opacity with slight tracheal deviation to the left, not significantly changed since the prior examination from July, 2013. Query vascular anomaly or low grade indolent mediastinal tumor such as thymoma. 2. Pulmonary findings most consistent with UIP/interstitial fibrosis, stable since 2013.        Assessment & Plan:

## 2015-04-05 NOTE — Assessment & Plan Note (Signed)
Try off ACEI 12/12/11 for atypical upper airway symptoms - rec trial off  cozar 04/05/2015 same reason   For reasons that may related to vascular permability and nitric oxide pathways but not elevated  bradykinin levels (as seen with  ACEi use) losartan in the generic form has been reported now from mulitple sources  to cause a similar pattern of non-specific  upper airway symptoms as seen with acei.   This has not been reported with exposure to the other ARB's to date, so it seems reasonable for now to try either generic diovan or avapro if ARB needed or use an alternative class altogether.  See:  Lelon Frohlich Allergy Asthma Immunol  2008: 101: p [phone]   Try diovan 80 mg one daily

## 2015-04-05 NOTE — Assessment & Plan Note (Addendum)
The most common causes of chronic cough in immunocompetent adults include the following: upper airway cough syndrome (UACS), previously referred to as postnasal drip syndrome (PNDS), which is caused by variety of rhinosinus conditions; (2) asthma; (3) GERD; (4) chronic bronchitis from cigarette smoking or other inhaled environmental irritants; (5) nonasthmatic eosinophilic bronchitis; and (6) bronchiectasis.   These conditions, singly or in combination, have accounted for up to 94% of the causes of chronic cough in prospective studies.   Other conditions have constituted no >6% of the causes in prospective studies These have included bronchogenic carcinoma, chronic interstitial pneumonia, sarcoidosis, left ventricular failure, ACEI-induced cough, and aspiration from a condition associated with pharyngeal dysfunction.    Chronic cough is often simultaneously caused by more than one condition. A single cause has been found from 38 to 82% of the time, multiple causes from 18 to 62%. Multiply caused cough has been the result of three diseases up to 42% of the time.       Based on hx and exam, this is most likely:  Classic Upper airway cough syndrome, so named because it's frequently impossible to sort out how much is  CR/sinusitis with freq throat clearing (which can be related to primary GERD)   vs  causing  secondary (" extra esophageal")  GERD from wide swings in gastric pressure that occur with throat clearing, often  promoting self use of mint and menthol lozenges that reduce the lower esophageal sphincter tone and exacerbate the problem further in a cyclical fashion.   These are the same pts (now being labeled as having "irritable larynx syndrome" by some cough centers) who not infrequently have a history of having failed to tolerate ace inhibitors ( and now cozar, see hbp)   dry powder inhalers or biphosphonates or report having atypical reflux symptoms that don't respond to standard doses of PPI ,  and are easily confused as having aecopd or asthma flares by even experienced allergists/ pulmonologists.   The first step is to  Eliminate cozar (see hbp) and eval for chronic sinusitis/ bronchiectasis (with ct sinus and chest)    then regroup if the cough persists to work to work through the cough algorhythm.  Discussed with pt: The standardized cough guidelines published in Chest by Lissa Morales in 2006 are still the best available and consist of a multiple step process (up to 12!) , not a single office visit,  and are intended  to address this problem logically,  with an alogrithm dependent on response to empiric treatment at  each progressive step  to determine a specific diagnosis with  minimal addtional testing needed. Therefore if adherence is an issue or can't be accurately verified,  it's very unlikely the standard evaluation and treatment will be successful here.    Furthermore, response to therapy (other than acute cough suppression, which should only be used short term with avoidance of narcotic containing cough syrups if possible), can be a gradual process for which the patient may perceive immediate benefit.  Unlike going to an eye doctor where the best perscription is almost always the first one and is immediately effective, this is almost never the case in the management of chronic cough syndromes. Therefore the patient needs to commit up front to consistently adhere to recommendations  for up to 6 weeks of therapy directed at the likely underlying problem(s) before the response can be reasonably evaluated.   See instructions for specific recommendations which were reviewed directly with the patient who was given  a copy with highlighter outlining the key components.

## 2015-04-05 NOTE — Assessment & Plan Note (Signed)
-  PFT's 12/08/11 FEV1 2.09 (54%) ratio 73 and DLCO 55 corrects to 108% - PFTs  04/05/15 FEV1  1.95 (52%) ratio 72 and no change p saba/ erv 42 and dlco 53 corrects to 99%     As long as not coughing/ which I strongly suspect is upper airway source, no need for further w/u or rx  I reviewed the Fletcher curve with the patient that basically indicates  if you quit smoking when your best day FEV1 is still well preserved (as is clearly  the case here)  it is highly unlikely you will progress to severe disease and informed the patient there was no medication on the market that has proven to alter the curve/ its downward trajectory  or the likelihood of progression of their disease.  Therefore  maintaining abstinence is the most important aspect of care, not choice of inhalers or for that matter, doctors.

## 2015-04-08 LAB — ALLERGY FULL PROFILE
Allergen, D pternoyssinus,d7: 1.06 kU/L — ABNORMAL HIGH
Allergen,Goose feathers, e70: <0.1 kU/L
Alternaria Alternata: <0.1 kU/L
Aspergillus fumigatus, m3: <0.1 kU/L
Bahia Grass: <0.1 kU/L
Bermuda Grass: <0.1 kU/L
Box Elder IgE: <0.1 kU/L
Candida Albicans: <0.1 kU/L
Cat Dander: <0.1 kU/L
Common Ragweed: <0.1 kU/L
Curvularia lunata: <0.1 kU/L
D. farinae: 1.24 kU/L — ABNORMAL HIGH
Dog Dander: <0.1 kU/L
Elm IgE: <0.1 kU/L
Fescue: <0.1 kU/L
G005 Rye, Perennial: <0.1 kU/L
G009 Red Top: <0.1 kU/L
Goldenrod: <0.1 kU/L
Helminthosporium halodes: <0.1 kU/L
House Dust Hollister: <0.1 kU/L
IgE (Immunoglobulin E), Serum: 18 kU/L
Lamb's Quarters: <0.1 kU/L
Oak: <0.1 kU/L
Plantain: <0.1 kU/L
Stemphylium Botryosum: <0.1 kU/L
Sycamore Tree: <0.1 kU/L
Timothy Grass: <0.1 kU/L

## 2015-04-09 ENCOUNTER — Telehealth: Payer: Self-pay | Admitting: Internal Medicine

## 2015-04-09 NOTE — Telephone Encounter (Signed)
Result Note     Call patient : Study is unremarkable, no change in recs (minimal dust allergy only)  ---  lmomtcb x1

## 2015-04-09 NOTE — Telephone Encounter (Signed)
Pt is aware of results. 

## 2015-04-10 ENCOUNTER — Ambulatory Visit (INDEPENDENT_AMBULATORY_CARE_PROVIDER_SITE_OTHER)
Admission: RE | Admit: 2015-04-10 | Discharge: 2015-04-10 | Disposition: A | Discharge status: Home / Self Care | Payer: 59 | Source: Ambulatory Visit | Attending: Internal Medicine | Admitting: Internal Medicine

## 2015-04-10 DIAGNOSIS — R05 Cough: Secondary | ICD-10-CM | POA: Diagnosis not present

## 2015-04-10 DIAGNOSIS — R9389 Abnormal findings on diagnostic imaging of other specified body structures: Secondary | ICD-10-CM

## 2015-04-10 DIAGNOSIS — R053 Chronic cough: Secondary | ICD-10-CM

## 2015-04-11 ENCOUNTER — Telehealth: Payer: Self-pay | Admitting: Internal Medicine

## 2015-04-11 NOTE — Progress Notes (Signed)
Quick Note:  LMTCB ______ 

## 2015-04-11 NOTE — Telephone Encounter (Signed)
Notes Recorded by Rosana Berger, CMA on 04/11/2015 at 11:47 AM LMTCB ------  Notes Recorded by Tanda Rockers, MD on 04/11/2015 at 6:08 AM Call patient : Study shows no acute changes but there is scarring that can cause symptoms we'll need to get back together in office to get a plan togethery for future care ----------------------  Spoke with pt, aware of results/recs.  Scheduled for Tuesday at 10:45 to discuss care.  Nothing further needed.

## 2015-04-12 NOTE — Progress Notes (Signed)
Quick Note:  LVM for pt to return call ______ 

## 2015-04-15 ENCOUNTER — Telehealth: Payer: Self-pay | Admitting: Internal Medicine

## 2015-04-15 NOTE — Telephone Encounter (Signed)
Result Note     Call patient : Study is unremarkable, no change in recs   I spoke with patient about results and he verbalized understanding and had no questions.  

## 2015-04-16 ENCOUNTER — Ambulatory Visit: Payer: 59 | Admitting: Internal Medicine

## 2015-04-18 ENCOUNTER — Ambulatory Visit (INDEPENDENT_AMBULATORY_CARE_PROVIDER_SITE_OTHER): Payer: 59 | Admitting: Internal Medicine

## 2015-04-18 ENCOUNTER — Encounter: Payer: Self-pay | Admitting: Internal Medicine

## 2015-04-18 VITALS — BP 106/60 | HR 88 | Ht 72.0 in | Wt 220.0 lb

## 2015-04-18 DIAGNOSIS — I10 Essential (primary) hypertension: Secondary | ICD-10-CM

## 2015-04-18 DIAGNOSIS — R9389 Abnormal findings on diagnostic imaging of other specified body structures: Secondary | ICD-10-CM

## 2015-04-18 DIAGNOSIS — R938 Abnormal findings on diagnostic imaging of other specified body structures: Secondary | ICD-10-CM | POA: Diagnosis not present

## 2015-04-18 DIAGNOSIS — R05 Cough: Secondary | ICD-10-CM

## 2015-04-18 DIAGNOSIS — R053 Chronic cough: Secondary | ICD-10-CM

## 2015-04-18 MED ORDER — FAMOTIDINE 20 MG PO TABS: ORAL_TABLET | ORAL | Status: DC

## 2015-04-18 NOTE — Progress Notes (Signed)
Subjective:    Patient ID: Wayne Webb, male    DOB: [birth year] .   MRN: [id number]    Brief patient profile:  49 yobm quit smoking 1993 no breathing problem but did have bad pnds eval by ENT > surgery [place] early  90s > much better then worse again since moved to [place] with recurrent cough so referred by Dr Larose Kells for cough 04/05/2015 with prob sarcoid ? Duration on CT 02/15/15    History of Present Illness  04/05/2015 1st [facility] office visit/ [name]   Chief Complaint  Patient presents with  . Pulmonary Consult    Referred by Dr. Larose Kells. Pt c/o cough x 3 yrs- prod in the am with light brown sputum.   cough worse first thing am/ best p sinus surgery - does not wake him prematurely, variably productive year round Not limited by breathing from desired activities rec Stop losartan and start valsartan 80 mg one daily  GERD diet  Please see patient coordinator before you leave today  to schedule CT chest without contrast  and sinus CT same place Please remember to go to the lab department downstairs for your tests - we will call you with the results when they are available.    04/18/2015 f/u ov/Tho[facility] re: cough x 20 years/ sarcoid on CT  Chief Complaint  Patient presents with  . Follow-up    Pt states his cough has improved some since the last visit- still coughing up min sputum in the am but it is now clear. No new co's today.   best treatment for his recurrent  Cough has always been treatment for sinus dz  Not limited by breathing from desired activities  = ex biking / lots of steps at work no trouble   No obvious day to day or daytime variabilty or assoc  cp or chest tightness, subjective wheeze overt sinus or hb symptoms. No unusual exp hx or h/o childhood pna/ asthma or knowledge of premature birth.  Sleeping ok without nocturnal  or early am exacerbation  of respiratory  c/o's or need for noct saba. Also denies any obvious fluctuation of symptoms with weather or  environmental changes or other aggravating or alleviating factors except as outlined above   Current Medications, Allergies, Complete Past Medical History, Past Surgical History, Family History, and Social History were reviewed in [employer] record.  ROS  The following are not active complaints unless bolded sore throat, dysphagia, dental problems, itching, sneezing,  nasal congestion or excess/ purulent secretions, ear ache,   fever, chills, sweats, unintended wt loss, pleuritic or exertional cp, hemoptysis,  orthopnea pnd or leg swelling, presyncope, palpitations, abdominal pain, anorexia, nausea, vomiting, diarrhea  or change in bowel or urinary habits, change in stools or urine, dysuria,hematuria,  rash, arthralgias, visual complaints, headache, numbness weakness or ataxia or problems with walking or coordination,  change in mood/affect or memory.           Objective:   Physical Exam  obese amb bm nad    04/18/2015        220  Wt Readings from Last 3 Encounters:  04/05/15 223 lb (101.152 kg)  03/11/15 218 lb (98.884 kg)  02/15/15 221 lb (100.245 kg)    Vital signs reviewed   HEENT: nl dentition, turbinates, and orophanx. Nl external ear canals without cough reflex   NECK :  without JVD/Nodes/TM/ nl carotid upstrokes bilaterally   LUNGS: no acc muscle use,  clear to A and P bilaterally without cough on insp or exp maneuvers   CV:  RRR  no s3 or murmur or increase in P2, no edema   ABD:  soft and nontender with nl excursion in the supine position. No bruits or organomegaly, bowel sounds nl  MS:  warm without deformities, calf tenderness, cyanosis or clubbing  SKIN: warm and dry without lesions    NEURO:  alert, approp, no deficits      I personally reviewed images and agree with radiology impression as follows:  CXR: 02/15/15 Right paratracheal opacity with slight tracheal deviation to the left, not significantly changed since the prior  examination from July, 2013. Query vascular anomaly or low grade indolent mediastinal tumor such as thymoma. 2. Pulmonary findings most consistent with UIP/interstitial fibrosis, stable since 2013.        Assessment & Plan:

## 2015-04-18 NOTE — Patient Instructions (Addendum)
Sarcoidosis is a benign inflammatory condition caused by  The  immune system being too revved up like a thermostat on your furnace that's partially  stuck causing arthitis, rash, short of breath and cough and vision issues.  It typically burns itself out in 75% of patients by the end of 3 years with little to indicate that we really change the natural course of the disease by aggressive treatments intended to alter it.  Treatment is generally reserved for patients with major symptoms we can attribute to sarcoid or if a vital organ (like the eye or kidney or nervous system) becomes affected.    Add pepcid 20 mg one at bedtime and chlortrimeton 4 mg x 1 or 2 to see if helps the am cough(if  two makes you to sleepy just use one)  For drainage take chlortrimeton (chlorpheniramine) 4 mg every 4 hours available over the counter (may cause drowsiness)   Please schedule a follow up visit in 6 months but call sooner if needed

## 2015-04-21 ENCOUNTER — Encounter: Payer: Self-pay | Admitting: Internal Medicine

## 2015-04-21 NOTE — Assessment & Plan Note (Addendum)
-   suspected acei cough 11/2011 > d/c'd - allergy profile 04/05/2015 > Eos 0.2/  IgE 18/ pos RAST dust only  - sinus CT 04/10/15 > neg - trial off cozar 04/18/15   The most common causes of chronic cough in immunocompetent adults include the following: upper airway cough syndrome (UACS), previously referred to as postnasal drip syndrome (PNDS), which is caused by variety of rhinosinus conditions; (2) asthma; (3) GERD; (4) chronic bronchitis from cigarette smoking or other inhaled environmental irritants; (5) nonasthmatic eosinophilic bronchitis; and (6) bronchiectasis.   These conditions, singly or in combination, have accounted for up to 94% of the causes of chronic cough in prospective studies.   Other conditions have constituted no >6% of the causes in prospective studies These have included bronchogenic carcinoma, chronic interstitial pneumonia, sarcoidosis, left ventricular failure, ACEI-induced cough, and aspiration from a condition associated with pharyngeal dysfunction.    Chronic cough is often simultaneously caused by more than one condition. A single cause has been found from 38 to 82% of the time, multiple causes from 18 to 62%. Multiply caused cough has been the result of three diseases up to 42% of the time.       Based on hx and exam, this is most likely:  Classic Upper airway cough syndrome, so named because it's frequently impossible to sort out how much is  CR/sinusitis with freq throat clearing (which can be related to primary GERD)   vs  causing  secondary (" extra esophageal")  GERD from wide swings in gastric pressure that occur with throat clearing, often  promoting self use of mint and menthol lozenges that reduce the lower esophageal sphincter tone and exacerbate the problem further in a cyclical fashion.   These are the same pts (now being labeled as having "irritable larynx syndrome" by some cough centers) who not infrequently have a history of having failed to tolerate ace  inhibitors,  dry powder inhalers or biphosphonates or report having atypical reflux symptoms that don't respond to standard doses of PPI , and are easily confused as having aecopd or asthma flares by even experienced allergists/ pulmonologists.   The first step is to maximize acid suppression and eliminate noct pnds/acid reflux  by using 1st gen h1/h2 as per guidelines  then regroup if the cough persists.  See instructions for specific recommendations which were reviewed directly with the patient who was given a copy with highlighter outlining the key components.

## 2015-04-21 NOTE — Assessment & Plan Note (Signed)
-   Evaluated by Dr Annamaria Boots in [facility] 04/10/15 1. Considerable chronic mediastinal and bilateral hilar adenopathy along with para esophageal and upper abdominal adenopathy, much of which has speckled calcification. Differential diagnostic considerations include sarcoidosis, silicosis, granulomatous process such as tuberculosis or histoplasmosis, or lymphoma, possibly partially treated (correlate with patient history). Adenopathy was noted on the prior chest radiograph of 1999 and accordingly is unlikely to represent active malignancy. 2. Honeycombing in the lung bases with scattered volume loss is specially along the lung bases and right-sided fissures. There is some bronchiectasis noted. These could represent pulmonary manifestations of sarcoidosis, along with slight nodularity along the fissures. 3. Part of the right mediastinal prominence is also attributable to a right-sided aortic arch.   I had an extended final summary discussion with the patient reviewing all relevant studies completed to date and  lasting 15 to 20 minutes of a 25 minute visit on the following issues:    1) no further w/u needed> this is clearly burned out sarcoid present for years and probably not correlating with any of his symptoms  2) reviewed natural hx/ difficulty with clinical correlation by CT and would just do yearly cxr from here on unless new/ different resp symptoms indicate otherwise  3) Each maintenance medication was reviewed in detail including most importantly the difference between maintenance and as needed and under what circumstances the prns are to be used.  Please see instructions for details which were reviewed in writing and the patient given a copy.

## 2015-04-21 NOTE — Assessment & Plan Note (Signed)
Try off ACEI 12/12/11 for atypical upper airway symptoms - rec trial off  cozar 04/05/2015 same reason   For reasons that may related to vascular permability and nitric oxide pathways but not elevated  bradykinin levels (as seen with  ACEi use) losartan in the generic form has been reported now from mulitple sources  to cause a similar pattern of non-specific  upper airway symptoms as seen with acei.   This has not been reported with exposure to the other ARB's to date, so it seems reasonable for now to try either generic diovan or avapro if ARB needed or use an alternative class altogether.  See:  Lelon Frohlich Allergy Asthma Immunol  2008: 101: p [phone]    Try valsartan 80 mg daily

## 2015-04-21 NOTE — Assessment & Plan Note (Signed)
Body mass index is 29.83 kg/(m^2).  Lab Results  Component Value Date   TSH 0.50 03/11/2015     Contributing to gerd tendency/ doe/ needs to achieve and maintain neg calorie balance >f/u primary care

## 2015-06-28 ENCOUNTER — Other Ambulatory Visit: Payer: Self-pay | Admitting: Endocrinology

## 2015-07-08 ENCOUNTER — Other Ambulatory Visit: Payer: Self-pay | Admitting: Endocrinology

## 2015-07-12 ENCOUNTER — Ambulatory Visit: Payer: 59 | Admitting: Endocrinology

## 2015-07-24 ENCOUNTER — Ambulatory Visit (INDEPENDENT_AMBULATORY_CARE_PROVIDER_SITE_OTHER): Payer: 59 | Admitting: Endocrinology

## 2015-07-24 ENCOUNTER — Encounter: Payer: Self-pay | Admitting: Endocrinology

## 2015-07-24 VITALS — BP 122/80 | HR 80 | Temp 98.3°F | Ht 72.0 in | Wt 216.0 lb

## 2015-07-24 DIAGNOSIS — E11329 Type 2 diabetes mellitus with mild nonproliferative diabetic retinopathy without macular edema: Secondary | ICD-10-CM

## 2015-07-24 DIAGNOSIS — E113299 Type 2 diabetes mellitus with mild nonproliferative diabetic retinopathy without macular edema, unspecified eye: Secondary | ICD-10-CM

## 2015-07-24 LAB — POCT GLYCOSYLATED HEMOGLOBIN (HGB A1C): Hemoglobin A1C: 5.9

## 2015-07-24 MED ORDER — INSULIN LISPRO PROT & LISPRO (75-25 MIX) 100 UNIT/ML KWIKPEN: PEN_INJECTOR | SUBCUTANEOUS | Status: DC

## 2015-07-24 NOTE — Progress Notes (Signed)
Subjective:    Patient ID: Wayne Webb, male    DOB: [birth year], 49 y.o.   MRN: [id number]  HPI Pt returns for f/u of diabetes mellitus: DM type: Insulin-requiring type 2 Dx'ed: 4315 Complications: retinopathy Therapy: insulin since 2011.  DKA: never Severe hypoglycemia: never.  Pancreatitis: never Other: he is willing to take insulin only as often as bid.   Interval history: no cbg record, but states cbg's are well-controlled.  There is no trend throughout the day.  pt states he feels well in general.  He takes 45 units with breakfast and 20 units with his evening meal.   Past Medical History  Diagnosis Date  . HYPERLIPIDEMIA 03/15/2007  . GOITER, MULTINODULAR 03/21/2010  . DIABETES MELLITUS, TYPE II 03/15/2007  . ERECTILE DYSFUNCTION 03/15/2007  . Diplopia 12/24/2009  . ALLERGIC RHINITIS 03/15/2007  . CRI (chronic renal insufficiency)     CRI, creat 1.4 , renal u/s 04-2011 neg    Past Surgical History  Procedure Laterality Date  . Quinn Axe  10-2012    Social History   Social History  . Marital Status: Married    Spouse Name: N/A  . Number of Children: 1  . Years of Education: N/A   Occupational History  . active job Science writer   Social History Main Topics  . Smoking status: Former Smoker -- 0.50 packs/day for 10 years    Types: Cigarettes    Quit date: 10/26/1993  . Smokeless tobacco: Never Used  . Alcohol Use: No     Comment:    . Drug Use: No  . Sexual Activity: Not on file   Other Topics Concern  . Not on file   Social History Narrative   Divorced. Remarried --- pt , wife, wife's daughter     Current Outpatient Prescriptions on File Prior to Visit  Medication Sig Dispense Refill  . azelastine (ASTELIN) 0.1 % nasal spray Place 2 sprays into both nostrils at bedtime as needed for rhinitis. Use in each nostril as directed 30 mL 3  . B-D UF III MINI PEN NEEDLES 31G X 5 MM MISC USE AS DIRECTED TWICE DAILY 200 each 0  . CREATINE PO  Take 1 scoop by mouth daily as needed. Before going to gym    . Cyanocobalamin (B-12 PO) Take 1 tablet by mouth daily. OTC    . fluticasone (FLONASE) 50 MCG/ACT nasal spray PLACE 2 SPRAYS INTO BOTH NOSTRILS EVERY DAY 16 g 0  . glucose blood (ONE TOUCH ULTRA TEST) test strip USE 2 TIMES DAILY AS DIRECTED TO CHECK BLOOD SUGAR 150 each 2  . LEVITRA 20 MG tablet TAKE AS NEEDED AS DIRECTED 10 tablet 0  . ONE TOUCH ULTRA TEST test strip USE 2 TIMES DAILY AS DIRECTED TO CHECK BLOOD SUGAR 50 each 0  . valsartan (DIOVAN) 80 MG tablet TAKE 1 TABLET(80 MG) BY MOUTH DAILY 90 tablet 3  . famotidine (PEPCID) 20 MG tablet One at bedtime (Patient not taking: Reported on 07/24/2015) 30 tablet 11   No current facility-administered medications on file prior to visit.    No Known Allergies  Family History  Problem Relation Age of Onset  . Heart disease Mother     CABG-onset in her early 103's  . Diabetes Mother 41    had DM from her 52's  . Hypertension Mother   . Prostate cancer Father     dx in his 76s  . Stroke Neg Hx   .  Colon cancer Neg Hx     BP 122/80 mmHg  Pulse 80  Temp(Src) 98.3 F (36.8 C) (Oral)  Ht 6' (1.829 m)  Wt 216 lb (97.977 kg)  BMI 29.29 kg/m2  SpO2 95%  Review of Systems He denies hypoglycemia.      Objective:   Physical Exam VITAL SIGNS:  See vs page GENERAL: no distress Pulses: dorsalis pedis intact bilat.   MSK: no deformity of the feet CV: no leg edema Skin:  no ulcer on the feet.  normal color and temp on the feet. Neuro: sensation is intact to touch on the feet    A1c=5.9%    Assessment & Plan:  DM: overcontrolled  Patient is advised the following: Patient Instructions  Please reduce the insulin to 40 units with breakfast and 20 units with his evening meal.  Please make a follow-up appointment in 4-5 months.  you will be due for another ultrasound also next year.  check your blood sugar 2 times a day.  vary the time of day when you check, between  before the 3 meals, and at bedtime.  also check if you have symptoms of your blood sugar being too high or too low.  please keep a record of the readings and bring it to your next appointment here.  please call us sooner if you are having low blood sugar episodes.

## 2015-07-24 NOTE — Patient Instructions (Addendum)
Please reduce the insulin to 40 units with breakfast and 20 units with his evening meal.  Please make a follow-up appointment in 4-5 months.  you will be due for another ultrasound also next year.  check your blood sugar 2 times a day.  vary the time of day when you check, between before the 3 meals, and at bedtime.  also check if you have symptoms of your blood sugar being too high or too low.  please keep a record of the readings and bring it to your next appointment here.  please call us sooner if you are having low blood sugar episodes.

## 2015-08-01 ENCOUNTER — Other Ambulatory Visit: Payer: Self-pay | Admitting: Endocrinology

## 2015-08-22 ENCOUNTER — Telehealth: Payer: Self-pay | Admitting: Internal Medicine

## 2015-08-22 ENCOUNTER — Ambulatory Visit: Payer: 59 | Admitting: Internal Medicine

## 2015-08-22 DIAGNOSIS — Z0289 Encounter for other administrative examinations: Secondary | ICD-10-CM

## 2015-08-29 NOTE — Telephone Encounter (Signed)
Pt was no show 08/22/15 10:45am, follow up appt, pt has not rescheduled, charge or no charge?

## 2015-08-30 NOTE — Telephone Encounter (Signed)
Left msg for pt to reschedule f/u appt

## 2015-08-30 NOTE — Telephone Encounter (Signed)
First no show/cancellation with [place], no charge. Please try to call Pt and reschedule. Thank you.

## 2015-10-09 ENCOUNTER — Other Ambulatory Visit: Payer: Self-pay | Admitting: Endocrinology

## 2015-10-15 ENCOUNTER — Other Ambulatory Visit: Payer: Self-pay | Admitting: Internal Medicine

## 2015-10-30 ENCOUNTER — Telehealth: Payer: Self-pay | Admitting: Endocrinology

## 2015-10-30 NOTE — Telephone Encounter (Signed)
Drink plenty of fluids (while you are sick, doesn't have to be diet drinks) Reduce insulin by half, until you are feeling better carefully check cbg's.  Call if over 400 or under 70. See PCP for sxs.

## 2015-10-30 NOTE — Telephone Encounter (Signed)
Patient called stating that he has been sick to his stomach and can keep anything down  Wayne Webb would like to know what he should do about his insulin ?   Please advise    Thank you

## 2015-10-30 NOTE — Telephone Encounter (Signed)
I contacted the pt and advised of note below. Pt verbalized understanding.  

## 2015-10-30 NOTE — Telephone Encounter (Signed)
See note below and please advise, Thanks! 

## 2015-12-04 ENCOUNTER — Other Ambulatory Visit: Payer: Self-pay | Admitting: Medical

## 2015-12-04 ENCOUNTER — Ambulatory Visit (HOSPITAL_BASED_OUTPATIENT_CLINIC_OR_DEPARTMENT_OTHER)
Admission: RE | Admit: 2015-12-04 | Discharge: 2015-12-04 | Disposition: A | Discharge status: Home / Self Care | Payer: 59 | Source: Ambulatory Visit | Attending: Medical | Admitting: Medical

## 2015-12-04 ENCOUNTER — Encounter: Payer: Self-pay | Admitting: Medical

## 2015-12-04 ENCOUNTER — Ambulatory Visit (INDEPENDENT_AMBULATORY_CARE_PROVIDER_SITE_OTHER): Payer: 59 | Admitting: Medical

## 2015-12-04 VITALS — BP 102/80 | HR 91 | Temp 98.2°F | Ht 72.0 in | Wt 210.4 lb

## 2015-12-04 DIAGNOSIS — R739 Hyperglycemia, unspecified: Secondary | ICD-10-CM | POA: Diagnosis not present

## 2015-12-04 DIAGNOSIS — J209 Acute bronchitis, unspecified: Secondary | ICD-10-CM

## 2015-12-04 DIAGNOSIS — R059 Cough, unspecified: Secondary | ICD-10-CM

## 2015-12-04 DIAGNOSIS — R062 Wheezing: Secondary | ICD-10-CM

## 2015-12-04 DIAGNOSIS — R6883 Chills (without fever): Secondary | ICD-10-CM | POA: Insufficient documentation

## 2015-12-04 DIAGNOSIS — R05 Cough: Secondary | ICD-10-CM | POA: Diagnosis present

## 2015-12-04 DIAGNOSIS — R0789 Other chest pain: Secondary | ICD-10-CM | POA: Diagnosis not present

## 2015-12-04 MED ORDER — AZITHROMYCIN 250 MG PO TABS: ORAL_TABLET | ORAL | Status: DC

## 2015-12-04 MED ORDER — BENZONATATE 200 MG PO CAPS: 200.0000 mg | ORAL_CAPSULE | Freq: Three times a day (TID) | ORAL | Status: DC | PRN

## 2015-12-04 MED ORDER — ALBUTEROL SULFATE HFA 108 (90 BASE) MCG/ACT IN AERS: 2.0000 | INHALATION_SPRAY | Freq: Four times a day (QID) | RESPIRATORY_TRACT | Status: DC | PRN

## 2015-12-04 MED ORDER — BECLOMETHASONE DIPROPIONATE 40 MCG/ACT IN AERS: 2.0000 | INHALATION_SPRAY | Freq: Two times a day (BID) | RESPIRATORY_TRACT | Status: DC

## 2015-12-04 NOTE — Progress Notes (Signed)
   Subjective:    Patient ID: Wayne Webb, male    DOB: 1966-03-15, 50 y.o.   MRN: [id number]  HPI   Pt has been coughing and wheezing since yesterday. Pt has mild productive cough. No diffuse achiness/myalgia.  Mild left side lung area pain on deep breathing and when coughs. T  Sweating mild intermittent since Sunday. At work yesterday felt hot/sweaty.   Pt states when younger and smoked he would use inhaler when had bronchitis. But no inhaler use recently since he stopped smoking about 25 years ago.    Pt on astelin and flonase,  He denies nasal congestion. But he sounds mild congested.      Review of Systems  Constitutional: Positive for fever and chills. Negative for fatigue.  HENT: Positive for congestion and rhinorrhea. Negative for ear pain, postnasal drip, sinus pressure, sneezing and sore throat.        Sounds congested. See physical exam.  Respiratory: Positive for cough and wheezing. Negative for chest tightness and shortness of breath.   Cardiovascular: Negative for chest pain and palpitations.  Gastrointestinal: Negative for abdominal pain.  Musculoskeletal: Negative for back pain.  Neurological: Negative for dizziness, syncope, numbness and headaches.  Hematological: Negative for adenopathy. Does not bruise/bleed easily.  Psychiatric/Behavioral: Negative for behavioral problems and confusion.       Objective:   Physical Exam  General  Mental Status - Alert. General Appearance - Well groomed. Not in acute distress.  Skin Rashes- No Rashes.  HEENT Head- Normal. Ear Auditory Canal - Left- Normal. Right - Normal.Tympanic Membrane- Left- Normal. Right- Normal. Eye Sclera/Conjunctiva- Left- Normal. Right- Normal. Nose & Sinuses Nasal Mucosa- Left-  Boggy and Congested. Right-  Boggy and  Congested.Bilateral no maxillary and no  frontal sinus pressure. Mouth & Throat Lips: Upper Lip- Normal: no dryness, cracking, pallor, cyanosis, or vesicular  eruption. Lower Lip-Normal: no dryness, cracking, pallor, cyanosis or vesicular eruption. Buccal Mucosa- Bilateral- No Aphthous ulcers. Oropharynx- No Discharge or Erythema. Tonsils: Characteristics- Bilateral- No Erythema or Congestion. Size/Enlargement- Bilateral- No enlargement. Discharge- bilateral-None.  Neck Neck- Supple. No Masses.   Chest and Lung Exam Auscultation: Breath Sounds:- even and unlabored. Faint rhonchi left lower lobe area. When he coughs expresses mild left low rib/lung area pain.  Cardiovascular Auscultation:Rythm- Regular, rate and rhythm. Murmurs & Other Heart Sounds:Ausculatation of the heart reveal- No Murmurs.  Lymphatic Head & Neck General Head & Neck Lymphatics: Bilateral: Description- No Localized lymphadenopathy.      Assessment & Plan:  You appear to have bronchitis. Rest hydrate and tylenol for fever. I am prescribing cough medicine benzonatate , and  Azithromycin antibiotic. For your nasal congestion history use your sprays.  For wheezing since yesterday start qvar and albuterol  I will get cxr today.  Your sugar today was 192. A lot better than earlier today. If sugars are increasing as earlier today let us know. Low sugar diet and continue diabetic meds.   Follow up in 7-10 days or as needed

## 2015-12-04 NOTE — Patient Instructions (Addendum)
You appear to have bronchitis. Rest hydrate and tylenol for fever. I am prescribing cough medicine benzonatate , and  Azithromycin antibiotic. For your nasal congestion history use your sprays.  For wheezing since yesterday start qvar and albuterol  I will get cxr today.  Your sugar today was 192. A lot better than earlier today. If sugars are increasing as earlier today let us know. Low sugar diet and continue diabetic meds.   Follow up in 7-10 days or as needed

## 2015-12-04 NOTE — Progress Notes (Signed)
Pre visit review using our clinic review tool, if applicable. No additional management support is needed unless otherwise documented below in the visit note. 

## 2015-12-06 ENCOUNTER — Telehealth: Payer: Self-pay | Admitting: Internal Medicine

## 2015-12-06 NOTE — Telephone Encounter (Signed)
LM to sch CPE (last 02/15/15) and update flu record

## 2015-12-16 ENCOUNTER — Ambulatory Visit (INDEPENDENT_AMBULATORY_CARE_PROVIDER_SITE_OTHER): Payer: 59 | Admitting: Internal Medicine

## 2015-12-16 ENCOUNTER — Encounter: Payer: Self-pay | Admitting: Internal Medicine

## 2015-12-16 VITALS — BP 118/66 | HR 96 | Temp 98.1°F | Ht 72.0 in | Wt 211.0 lb

## 2015-12-16 DIAGNOSIS — I10 Essential (primary) hypertension: Secondary | ICD-10-CM

## 2015-12-16 DIAGNOSIS — R05 Cough: Secondary | ICD-10-CM

## 2015-12-16 DIAGNOSIS — J209 Acute bronchitis, unspecified: Secondary | ICD-10-CM | POA: Diagnosis not present

## 2015-12-16 DIAGNOSIS — R053 Chronic cough: Secondary | ICD-10-CM

## 2015-12-16 DIAGNOSIS — Z09 Encounter for follow-up examination after completed treatment for conditions other than malignant neoplasm: Secondary | ICD-10-CM

## 2015-12-16 LAB — BASIC METABOLIC PANEL
BUN: 28 mg/dL — ABNORMAL HIGH (ref 6–23)
CO2: 27 mEq/L (ref 19–32)
Calcium: 10.1 mg/dL (ref 8.4–10.5)
Chloride: 103 mEq/L (ref 96–112)
Creatinine, Ser: 1.55 mg/dL — ABNORMAL HIGH (ref 0.40–1.50)
GFR: 60.9 mL/min (ref >60.00)
Glucose, Bld: 158 mg/dL — ABNORMAL HIGH (ref 70–99)
Potassium: 3.7 mEq/L (ref 3.5–5.1)
Sodium: 139 mEq/L (ref 135–145)

## 2015-12-16 NOTE — Progress Notes (Signed)
Subjective:    Patient ID: Wayne Webb, male    DOB: February 14, 1966, 50 y.o.   MRN: [id number]  DOS:  12/16/2015 Type of visit - description : Follow-up Interval history: Seen recently with bronchitis, chest x-ray with no acute changes, had a Z-Pak, feels better HTN: Was switch to Diovan last year, BP is excellent, was having chronic cough and that has definitely improved. No ambulatory BPs  Review of Systems Denies fever chills No chest pain No nausea, vomiting, diarrhea  Past Medical History  Diagnosis Date  . HYPERLIPIDEMIA 03/15/2007  . GOITER, MULTINODULAR 03/21/2010  . DIABETES MELLITUS, TYPE II 03/15/2007  . ERECTILE DYSFUNCTION 03/15/2007  . Diplopia 12/24/2009  . ALLERGIC RHINITIS 03/15/2007  . CRI (chronic renal insufficiency)     CRI, creat 1.4 , renal u/s 04-2011 neg    Past Surgical History  Procedure Laterality Date  . Quinn Axe  10-2012    Social History   Social History  . Marital Status: Married    Spouse Name: N/A  . Number of Children: 1  . Years of Education: N/A   Occupational History  . active job Science writer   Social History Main Topics  . Smoking status: Former Smoker -- 0.50 packs/day for 10 years    Types: Cigarettes    Quit date: 10/26/1993  . Smokeless tobacco: Never Used  . Alcohol Use: No     Comment:    . Drug Use: No  . Sexual Activity: Not on file   Other Topics Concern  . Not on file   Social History Narrative   Divorced. Remarried --- pt , wife, wife's daughter         Medication List       This list is accurate as of: 12/16/15  8:31 PM.  Always use your most recent med list.               albuterol 108 (90 Base) MCG/ACT inhaler  Commonly known as:  PROVENTIL HFA;VENTOLIN HFA  Inhale 2 puffs into the lungs every 6 (six) hours as needed for wheezing or shortness of breath.     azelastine 0.1 % nasal spray  Commonly known as:  ASTELIN  Place 2 sprays into both nostrils at bedtime as needed for  rhinitis.     B-12 PO  Take 1 tablet by mouth daily. OTC     B-D UF III MINI PEN NEEDLES 31G X 5 MM Misc  Generic drug:  Insulin Pen Needle  USE AS DIRECTED TWICE DAILY     CREATINE PO  Take 1 scoop by mouth daily as needed. Before going to gym     famotidine 20 MG tablet  Commonly known as:  PEPCID  One at bedtime     glucose blood test strip  Commonly known as:  ONE TOUCH ULTRA TEST  USE 2 TIMES DAILY AS DIRECTED TO CHECK BLOOD SUGAR     ONE TOUCH ULTRA TEST test strip  Generic drug:  glucose blood  Use 2 times daily as        directed to check blood     sugar     Insulin Lispro Prot & Lispro (75-25) 100 UNIT/ML Kwikpen  Commonly known as:  HUMALOG MIX 75/25 KWIKPEN  40 units with breakfast and 20 units with evening meal     QVAR 40 MCG/ACT inhaler  Generic drug:  beclomethasone  INHALE 2 PUFFS INTO THE LUNGS TWICE DAILY AT 10  AM AND AT 5 PM     valsartan 80 MG tablet  Commonly known as:  DIOVAN  TAKE 1 TABLET(80 MG) BY MOUTH DAILY           Objective:   Physical Exam BP 118/66 mmHg  Pulse 96  Temp(Src) 98.1 F (36.7 C) (Oral)  Ht 6' (1.829 m)  Wt 211 lb (95.709 kg)  BMI 28.61 kg/m2  SpO2 95% General:   Well developed, well nourished . NAD.  HEENT:  Normocephalic . Face symmetric, atraumatic Lungs:  CTA B Normal respiratory effort, no intercostal retractions, no accessory muscle use. Heart: RRR,  no murmur.  No pretibial edema bilaterally  Skin: Not pale. Not jaundice Neurologic:  alert & oriented X3.  Speech normal, gait appropriate for age and unassisted Psych--  Cognition and judgment appear intact.  Cooperative with normal attention span and concentration.  Behavior appropriate. No anxious or depressed appearing.      Assessment & Plan:   Assessment DM - dr Loanne Drilling Hyperlipidemia Goiter -- per dr Loanne Drilling CKD - renal US (-) 2012  ED Pulmonary: --[facility] --Chronic Cough, better after ACEi changed to Diovan 03-2015---Dr. Wert --Abnormal  CXR   PLAN: Bronchitis: Improved. HTN: Was switch from ACE inhibitors to Diovan last year, BP today is normal, no recent ambulatory BPs. Will check a BMP, continue with present care, recommend ambulatory BPs. Chronic cough: Improve after ACE inhibitors change to Diovan RTC 02-2016 as planned, cpx

## 2015-12-16 NOTE — Progress Notes (Signed)
Pre visit review using our clinic review tool, if applicable. No additional management support is needed unless otherwise documented below in the visit note. 

## 2015-12-16 NOTE — Patient Instructions (Addendum)
GO TO THE LAB : Get the blood work    See you in May   Check the  blood pressure  weekly   Be sure your blood pressure is between 110/65 and  130/85. If it is consistently higher or lower, let me know   '

## 2015-12-16 NOTE — Assessment & Plan Note (Signed)
Bronchitis: Improved. HTN: Was switch from ACE inhibitors to Diovan last year, BP today is normal, no recent ambulatory BPs. Will check a BMP, continue with present care, recommend ambulatory BPs. Chronic cough: Improve after ACE inhibitors change to Diovan RTC 02-2016 as planned, cpx

## 2015-12-19 ENCOUNTER — Other Ambulatory Visit: Payer: Self-pay | Admitting: Internal Medicine

## 2015-12-24 ENCOUNTER — Encounter: Payer: Self-pay | Admitting: Endocrinology

## 2015-12-24 ENCOUNTER — Ambulatory Visit (INDEPENDENT_AMBULATORY_CARE_PROVIDER_SITE_OTHER): Payer: 59 | Admitting: Endocrinology

## 2015-12-24 VITALS — BP 122/67 | HR 107 | Temp 98.4°F | Ht 72.0 in | Wt 213.0 lb

## 2015-12-24 DIAGNOSIS — E1122 Type 2 diabetes mellitus with diabetic chronic kidney disease: Secondary | ICD-10-CM | POA: Diagnosis not present

## 2015-12-24 DIAGNOSIS — Z794 Long term (current) use of insulin: Secondary | ICD-10-CM

## 2015-12-24 DIAGNOSIS — N182 Chronic kidney disease, stage 2 (mild): Secondary | ICD-10-CM

## 2015-12-24 DIAGNOSIS — E113299 Type 2 diabetes mellitus with mild nonproliferative diabetic retinopathy without macular edema, unspecified eye: Secondary | ICD-10-CM | POA: Diagnosis not present

## 2015-12-24 LAB — POCT GLYCOSYLATED HEMOGLOBIN (HGB A1C): Hemoglobin A1C: 8.7

## 2015-12-24 MED ORDER — INSULIN LISPRO PROT & LISPRO (75-25 MIX) 100 UNIT/ML KWIKPEN: PEN_INJECTOR | SUBCUTANEOUS | Status: DC

## 2015-12-24 NOTE — Progress Notes (Signed)
Subjective:    Patient ID: Wayne Webb, male    DOB: [birth year], 50 y.o.   MRN: [id number]  HPI Pt returns for f/u of diabetes mellitus: DM type: Insulin-requiring type 2 Dx'ed: 123XX123 Complications: retinopathy and renal insuff Therapy: insulin since 2011.  DKA: never Severe hypoglycemia: never.  Pancreatitis: never Other: he declines multiple daily injections.  Interval history: no cbg record, but states cbg's are approx 200.  He says it is higher in am than later in the day.  He attributes high a1c today to acute bronchitis a few weeks ago.   Past Medical History  Diagnosis Date  . HYPERLIPIDEMIA 03/15/2007  . GOITER, MULTINODULAR 03/21/2010  . DIABETES MELLITUS, TYPE II 03/15/2007  . ERECTILE DYSFUNCTION 03/15/2007  . Diplopia 12/24/2009  . ALLERGIC RHINITIS 03/15/2007  . CRI (chronic renal insufficiency)     CRI, creat 1.4 , renal u/s 04-2011 neg    Past Surgical History  Procedure Laterality Date  . Quinn Axe  10-2012    Social History   Social History  . Marital Status: Married    Spouse Name: N/A  . Number of Children: 1  . Years of Education: N/A   Occupational History  . active job Science writer   Social History Main Topics  . Smoking status: Former Smoker -- 0.50 packs/day for 10 years    Types: Cigarettes    Quit date: 10/26/1993  . Smokeless tobacco: Never Used  . Alcohol Use: No     Comment:    . Drug Use: No  . Sexual Activity: Not on file   Other Topics Concern  . Not on file   Social History Narrative   Divorced. Remarried --- pt , wife, wife's daughter     Current Outpatient Prescriptions on File Prior to Visit  Medication Sig Dispense Refill  . albuterol (PROVENTIL HFA;VENTOLIN HFA) 108 (90 Base) MCG/ACT inhaler Inhale 2 puffs into the lungs every 6 (six) hours as needed for wheezing or shortness of breath. 1 Inhaler 2  . azelastine (ASTELIN) 0.1 % nasal spray Place 2 sprays into both nostrils at bedtime as needed for  rhinitis or allergies. 30 mL 3  . B-D UF III MINI PEN NEEDLES 31G X 5 MM MISC USE AS DIRECTED TWICE DAILY 200 each 1  . CREATINE PO Take 1 scoop by mouth daily as needed. Before going to gym    . Cyanocobalamin (B-12 PO) Take 1 tablet by mouth daily. OTC    . famotidine (PEPCID) 20 MG tablet One at bedtime 30 tablet 11  . glucose blood (ONE TOUCH ULTRA TEST) test strip USE 2 TIMES DAILY AS DIRECTED TO CHECK BLOOD SUGAR 150 each 2  . ONE TOUCH ULTRA TEST test strip Use 2 times daily as        directed to check blood     sugar 100 each 3  . QVAR 40 MCG/ACT inhaler INHALE 2 PUFFS INTO THE LUNGS TWICE DAILY AT 10 AM AND AT 5 PM 26.1 g 3  . valsartan (DIOVAN) 80 MG tablet TAKE 1 TABLET(80 MG) BY MOUTH DAILY 90 tablet 3   No current facility-administered medications on file prior to visit.    No Known Allergies  Family History  Problem Relation Age of Onset  . Heart disease Mother     CABG-onset in her early 31's  . Diabetes Mother 24    had DM from her 29's  . Hypertension Mother   .  Prostate cancer Father     dx in his 21s  . Stroke Neg Hx   . Colon cancer Neg Hx     BP 122/67 mmHg  Pulse 107  Temp(Src) 98.4 F (36.9 C) (Oral)  Ht 6' (1.829 m)  Wt 213 lb (96.616 kg)  BMI 28.88 kg/m2  SpO2 93%  Review of Systems He denies hypoglycemia    Objective:   Physical Exam VITAL SIGNS:  See vs page GENERAL: no distress Pulses: dorsalis pedis intact bilat.   MSK: no deformity of the feet CV: no leg edema Skin:  no ulcer on the feet.  normal color and temp on the feet. Neuro: sensation is intact to touch on the feet.      A1c=8.7%     Assessment & Plan:  DM: he needs increased rx  Patient is advised the following: Patient Instructions  Please increase the insulin to 40 units with breakfast and 25 units with his evening meal.  Please make a follow-up appointment in 2 months.  you will be due for another ultrasound later this year.  check your blood sugar 2 times a day.   vary the time of day when you check, between before the 3 meals, and at bedtime.  also check if you have symptoms of your blood sugar being too high or too low.  please keep a record of the readings and bring it to your next appointment here.  please call us sooner if you are having low blood sugar episodes.

## 2015-12-24 NOTE — Patient Instructions (Addendum)
Please increase the insulin to 40 units with breakfast and 25 units with his evening meal.  Please make a follow-up appointment in 2 months.  you will be due for another ultrasound later this year.  check your blood sugar 2 times a day.  vary the time of day when you check, between before the 3 meals, and at bedtime.  also check if you have symptoms of your blood sugar being too high or too low.  please keep a record of the readings and bring it to your next appointment here.  please call us sooner if you are having low blood sugar episodes.

## 2016-01-01 ENCOUNTER — Encounter: Payer: Self-pay | Admitting: Endocrinology

## 2016-01-01 LAB — HM DIABETES EYE EXAM

## 2016-01-13 ENCOUNTER — Other Ambulatory Visit: Payer: Self-pay

## 2016-01-13 MED ORDER — GLUCOSE BLOOD VI STRP: ORAL_STRIP | Status: DC

## 2016-01-30 NOTE — Telephone Encounter (Signed)
Pt called in stating that he cancelled this appt thru the online system. He paid the fee in error before realizing what it was for. He is requesting the fee be waived as there must have been in error processing the cancellation thru mychart. He would like the payment refunded as well.   Please advise if Dr. Larose Kells will waive.  Pt would like call to notify him of decision & if we are refunding pymt. Best # [phone]

## 2016-02-21 ENCOUNTER — Encounter: Payer: Self-pay | Admitting: Endocrinology

## 2016-02-21 ENCOUNTER — Ambulatory Visit (INDEPENDENT_AMBULATORY_CARE_PROVIDER_SITE_OTHER): Payer: 59 | Admitting: Endocrinology

## 2016-02-21 VITALS — BP 132/86 | HR 68 | Temp 97.7°F | Ht 73.0 in | Wt 217.0 lb

## 2016-02-21 DIAGNOSIS — E1122 Type 2 diabetes mellitus with diabetic chronic kidney disease: Secondary | ICD-10-CM

## 2016-02-21 DIAGNOSIS — N182 Chronic kidney disease, stage 2 (mild): Secondary | ICD-10-CM | POA: Diagnosis not present

## 2016-02-21 DIAGNOSIS — Z794 Long term (current) use of insulin: Secondary | ICD-10-CM | POA: Diagnosis not present

## 2016-02-21 DIAGNOSIS — N183 Chronic kidney disease, stage 3 unspecified: Secondary | ICD-10-CM

## 2016-02-21 LAB — MICROALBUMIN / CREATININE URINE RATIO
Creatinine,U: 184.7 mg/dL
Microalb Creat Ratio: 2.8 mg/g (ref 0.0–30.0)
Microalb, Ur: 5.2 mg/dL — ABNORMAL HIGH (ref 0.0–1.9)

## 2016-02-21 LAB — POCT GLYCOSYLATED HEMOGLOBIN (HGB A1C): Hemoglobin A1C: 6.3

## 2016-02-21 MED ORDER — INSULIN LISPRO PROT & LISPRO (75-25 MIX) 100 UNIT/ML KWIKPEN: PEN_INJECTOR | SUBCUTANEOUS | Status: DC

## 2016-02-21 NOTE — Patient Instructions (Addendum)
Please reduce the insulin to 35 units with breakfast and 25 units with his evening meal.   Please make a follow-up appointment in 4 months.  you will be due for another ultrasound later this year.   check your blood sugar 2 times a day.  vary the time of day when you check, between before the 3 meals, and at bedtime.  also check if you have symptoms of your blood sugar being too high or too low.  please keep a record of the readings and bring it to your next appointment here.  please call us sooner if you are having low blood sugar episodes.

## 2016-02-21 NOTE — Progress Notes (Signed)
Subjective:    Patient ID: Wayne Webb, male    DOB: February 04, 1966, 50 y.o.   MRN: [id number]  HPI Pt returns for f/u of diabetes mellitus: DM type: Insulin-requiring type 2 Dx'ed: 123XX123 Complications: retinopathy and renal insuff.   Therapy: insulin since 2011.  DKA: never Severe hypoglycemia: never.  Pancreatitis: never Other: he declines multiple daily injections.  Interval history: no cbg record, but states cbg's vary from 89-100's.  It is in general higher as the day goes on.  pt states he feels well in general. Past Medical History  Diagnosis Date  . HYPERLIPIDEMIA 03/15/2007  . GOITER, MULTINODULAR 03/21/2010  . DIABETES MELLITUS, TYPE II 03/15/2007  . ERECTILE DYSFUNCTION 03/15/2007  . Diplopia 12/24/2009  . ALLERGIC RHINITIS 03/15/2007  . CRI (chronic renal insufficiency)     CRI, creat 1.4 , renal u/s 04-2011 neg    Past Surgical History  Procedure Laterality Date  . Quinn Axe  10-2012    Social History   Social History  . Marital Status: Married    Spouse Name: N/A  . Number of Children: 1  . Years of Education: N/A   Occupational History  . active job Science writer   Social History Main Topics  . Smoking status: Former Smoker -- 0.50 packs/day for 10 years    Types: Cigarettes    Quit date: 10/26/1993  . Smokeless tobacco: Never Used  . Alcohol Use: No     Comment:    . Drug Use: No  . Sexual Activity: Not on file   Other Topics Concern  . Not on file   Social History Narrative   Divorced. Remarried --- pt , wife, wife's daughter     Current Outpatient Prescriptions on File Prior to Visit  Medication Sig Dispense Refill  . albuterol (PROVENTIL HFA;VENTOLIN HFA) 108 (90 Base) MCG/ACT inhaler Inhale 2 puffs into the lungs every 6 (six) hours as needed for wheezing or shortness of breath. 1 Inhaler 2  . azelastine (ASTELIN) 0.1 % nasal spray Place 2 sprays into both nostrils at bedtime as needed for rhinitis or allergies. 30 mL 3    . B-D UF III MINI PEN NEEDLES 31G X 5 MM MISC USE AS DIRECTED TWICE DAILY 200 each 1  . CREATINE PO Take 1 scoop by mouth daily as needed. Before going to gym    . Cyanocobalamin (B-12 PO) Take 1 tablet by mouth daily. OTC    . famotidine (PEPCID) 20 MG tablet One at bedtime 30 tablet 11  . glucose blood (ONE TOUCH ULTRA TEST) test strip USE 2 TIMES DAILY AS DIRECTED TO CHECK BLOOD SUGAR 150 each 2  . ONE TOUCH ULTRA TEST test strip Use 2 times daily as        directed to check blood     sugar 100 each 3  . QVAR 40 MCG/ACT inhaler INHALE 2 PUFFS INTO THE LUNGS TWICE DAILY AT 10 AM AND AT 5 PM 26.1 g 3  . valsartan (DIOVAN) 80 MG tablet TAKE 1 TABLET(80 MG) BY MOUTH DAILY 90 tablet 3   No current facility-administered medications on file prior to visit.    No Known Allergies  Family History  Problem Relation Age of Onset  . Heart disease Mother     CABG-onset in her early 44's  . Diabetes Mother 67    had DM from her 76's  . Hypertension Mother   . Prostate cancer Father  dx in his 37s  . Stroke Neg Hx   . Colon cancer Neg Hx     BP 132/86 mmHg  Pulse 68  Temp(Src) 97.7 F (36.5 C) (Oral)  Ht 6\' 1"  (1.854 m)  Wt 217 lb (98.431 kg)  BMI 28.64 kg/m2  SpO2 94%  Review of Systems He denies hypoglycemia    Objective:   Physical Exam VITAL SIGNS:  See vs page GENERAL: no distress SKIN:  Insulin injection sites at the anterior abdomen are normal.   Lab Results  Component Value Date   HGBA1C 6.3 02/21/2016      Assessment & Plan:  DM: overcontrolled, given this regimen, which does match insulin to his changing needs throughout the day.   Patient is advised the following: Patient Instructions  Please reduce the insulin to 35 units with breakfast and 25 units with his evening meal.   Please make a follow-up appointment in 4 months.  you will be due for another ultrasound later this year.   check your blood sugar 2 times a day.  vary the time of day when you check,  between before the 3 meals, and at bedtime.  also check if you have symptoms of your blood sugar being too high or too low.  please keep a record of the readings and bring it to your next appointment here.  please call us sooner if you are having low blood sugar episodes.

## 2016-02-22 DIAGNOSIS — E119 Type 2 diabetes mellitus without complications: Secondary | ICD-10-CM | POA: Insufficient documentation

## 2016-03-16 ENCOUNTER — Encounter: Payer: 59 | Admitting: Internal Medicine

## 2016-04-23 ENCOUNTER — Other Ambulatory Visit: Payer: Self-pay | Admitting: Endocrinology

## 2016-05-01 ENCOUNTER — Other Ambulatory Visit: Payer: Self-pay | Admitting: Internal Medicine

## 2016-05-14 ENCOUNTER — Other Ambulatory Visit: Payer: Self-pay | Admitting: Internal Medicine

## 2016-06-08 ENCOUNTER — Telehealth: Payer: Self-pay | Admitting: Internal Medicine

## 2016-06-08 NOTE — Telephone Encounter (Signed)
Per 04/18/15 OV: Instructions   Sarcoidosis is a benign inflammatory condition caused by  The  immune system being too revved up like a thermostat on your furnace that's partially  stuck causing arthitis, rash, short of breath and cough and vision issues.  It typically burns itself out in 75% of patients by the end of 3 years with little to indicate that we really change the natural course of the disease by aggressive treatments intended to alter it. Treatment is generally reserved for patients with major symptoms we can attribute to sarcoid or if a vital organ (like the eye or kidney or nervous system) becomes affected.   Add pepcid 20 mg one at bedtime and chlortrimeton 4 mg x 1 or 2 to see if helps the am cough(if  two makes you to sleepy just use one) For drainage take chlortrimeton (chlorpheniramine) 4 mg every 4 hours available over the counter (may cause drowsiness)  Please schedule a follow up visit in 6 months but call sooner if needed  ----- LMTCB x1

## 2016-06-09 MED ORDER — VALSARTAN 80 MG PO TABS: ORAL_TABLET | ORAL | 0 refills | Status: DC

## 2016-06-09 NOTE — Telephone Encounter (Signed)
lmtcb x2 for pt. 

## 2016-06-09 NOTE — Telephone Encounter (Signed)
Spoke with pt and he requests refill on Valsartan. Pt has not been seen in >1 yr. Appt scheduled with MW for 06/24/16 and 1 refill sent to [facility]. Pt aware to keep appt for further refills. Nothing further needed.

## 2016-06-16 ENCOUNTER — Other Ambulatory Visit: Payer: Self-pay | Admitting: Endocrinology

## 2016-06-22 ENCOUNTER — Ambulatory Visit (INDEPENDENT_AMBULATORY_CARE_PROVIDER_SITE_OTHER): Payer: 59 | Admitting: Endocrinology

## 2016-06-22 ENCOUNTER — Encounter: Payer: Self-pay | Admitting: Endocrinology

## 2016-06-22 VITALS — BP 116/80 | HR 88 | Ht 73.0 in | Wt 212.0 lb

## 2016-06-22 DIAGNOSIS — Z794 Long term (current) use of insulin: Secondary | ICD-10-CM | POA: Diagnosis not present

## 2016-06-22 DIAGNOSIS — N183 Chronic kidney disease, stage 3 unspecified: Secondary | ICD-10-CM

## 2016-06-22 DIAGNOSIS — E042 Nontoxic multinodular goiter: Secondary | ICD-10-CM

## 2016-06-22 DIAGNOSIS — E1122 Type 2 diabetes mellitus with diabetic chronic kidney disease: Secondary | ICD-10-CM | POA: Diagnosis not present

## 2016-06-22 LAB — POCT GLYCOSYLATED HEMOGLOBIN (HGB A1C): Hemoglobin A1C: 6.4

## 2016-06-22 MED ORDER — INSULIN LISPRO PROT & LISPRO (75-25 MIX) 100 UNIT/ML KWIKPEN: PEN_INJECTOR | SUBCUTANEOUS | 11 refills | Status: DC

## 2016-06-22 NOTE — Progress Notes (Signed)
Subjective:    Patient ID: Wayne Webb, male    DOB: 1966/02/23, 50 y.o.   MRN: [id number]  HPI Pt returns for f/u of diabetes mellitus: DM type: Insulin-requiring type 2 Dx'ed: 123XX123 Complications: retinopathy and renal insuff.   Therapy: insulin since 2011.  DKA: never Severe hypoglycemia: never.  Pancreatitis: never Other: he declines multiple daily injections.  Interval history: no cbg record, but states cbg's vary from 90-200.  There is no trend throughout the day.  pt states he feels well in general.   Past Medical History:  Diagnosis Date  . ALLERGIC RHINITIS 03/15/2007  . CRI (chronic renal insufficiency)    CRI, creat 1.4 , renal u/s 04-2011 neg  . DIABETES MELLITUS, TYPE II 03/15/2007  . Diplopia 12/24/2009  . ERECTILE DYSFUNCTION 03/15/2007  . GOITER, MULTINODULAR 03/21/2010  . HYPERLIPIDEMIA 03/15/2007    Past Surgical History:  Procedure Laterality Date  . LASIK  10-2012    Social History   Social History  . Marital status: Married    Spouse name: N/A  . Number of children: 1  . Years of education: N/A   Occupational History  . active job Science writer   Social History Main Topics  . Smoking status: Former Smoker    Packs/day: 0.50    Years: 10.00    Types: Cigarettes    Quit date: 10/26/1993  . Smokeless tobacco: Never Used  . Alcohol use No     Comment:    . Drug use: No  . Sexual activity: Not on file   Other Topics Concern  . Not on file   Social History Narrative   Divorced. Remarried --- pt , wife, wife's daughter     Current Outpatient Prescriptions on File Prior to Visit  Medication Sig Dispense Refill  . azelastine (ASTELIN) 0.1 % nasal spray Place 2 sprays into both nostrils at bedtime as needed for rhinitis or allergies. 30 mL 3  . B-D UF III MINI PEN NEEDLES 31G X 5 MM MISC USE AS DIRECTED TWICE DAILY 200 each 0  . CREATINE PO Take 1 scoop by mouth daily as needed. Before going to gym    . Cyanocobalamin  (B-12 PO) Take 1 tablet by mouth daily. OTC    . glucose blood (ONE TOUCH ULTRA TEST) test strip USE 2 TIMES DAILY AS DIRECTED TO CHECK BLOOD SUGAR 150 each 2  . ONE TOUCH ULTRA TEST test strip Use 2 times daily as        directed to check blood     sugar 100 each 3  . valsartan (DIOVAN) 80 MG tablet TAKE 1 TABLET(80 MG) BY MOUTH DAILY 90 tablet 0  . albuterol (PROVENTIL HFA;VENTOLIN HFA) 108 (90 Base) MCG/ACT inhaler Inhale 2 puffs into the lungs every 6 (six) hours as needed for wheezing or shortness of breath. (Patient not taking: Reported on 06/22/2016) 1 Inhaler 2  . famotidine (PEPCID) 20 MG tablet One at bedtime (Patient not taking: Reported on 06/22/2016) 30 tablet 11  . QVAR 40 MCG/ACT inhaler INHALE 2 PUFFS INTO THE LUNGS TWICE DAILY AT 10 AM AND AT 5 PM (Patient not taking: Reported on 06/22/2016) 26.1 g 3   No current facility-administered medications on file prior to visit.     No Known Allergies  Family History  Problem Relation Age of Onset  . Heart disease Mother     CABG-onset in her early 85's  . Diabetes Mother 28  had DM from her 16's  . Hypertension Mother   . Prostate cancer Father     dx in his 70s  . Stroke Neg Hx   . Colon cancer Neg Hx     BP 116/80   Pulse 88   Ht 6\' 1"  (1.854 m)   Wt 212 lb (96.2 kg)   BMI 27.97 kg/m    Review of Systems He denies hypoglycemia.    Objective:   Physical Exam VITAL SIGNS:  See vs page GENERAL: no distress Pulses: dorsalis pedis intact bilat.   MSK: no deformity of the feet.  CV: no leg edema.  Skin:  no ulcer on the feet.  normal color and temp on the feet.  Neuro: sensation is intact to touch on the feet.    A1c=6.4%  Lab Results  Component Value Date   TSH 0.50 03/11/2015      Assessment & Plan:  Type 2 DM: overcontrolled: given this insulin regimen, which does match insulin to his changing needs throughout the day.   Multinodular goiter, due for recheck.

## 2016-06-22 NOTE — Patient Instructions (Addendum)
Please reduce the insulin to 35 units with breakfast and 20 units with his evening meal.   Let's recheck the ultrasound.  you will receive a phone call, about a day and time for an appointment.  Please come back for a follow-up appointment in 6 months.   Please see Dr Larose Kells soon, for your regular checkup.   check your blood sugar 2 times a day.  vary the time of day when you check, between before the 3 meals, and at bedtime.  also check if you have symptoms of your blood sugar being too high or too low.  please keep a record of the readings and bring it to your next appointment here.  please call us sooner if you are having low blood sugar episodes.

## 2016-06-24 ENCOUNTER — Ambulatory Visit (INDEPENDENT_AMBULATORY_CARE_PROVIDER_SITE_OTHER): Payer: 59 | Admitting: Internal Medicine

## 2016-06-24 ENCOUNTER — Encounter: Payer: Self-pay | Admitting: Internal Medicine

## 2016-06-24 VITALS — BP 108/68 | HR 96 | Ht 71.0 in | Wt 215.0 lb

## 2016-06-24 DIAGNOSIS — R938 Abnormal findings on diagnostic imaging of other specified body structures: Secondary | ICD-10-CM

## 2016-06-24 DIAGNOSIS — R05 Cough: Secondary | ICD-10-CM | POA: Diagnosis not present

## 2016-06-24 DIAGNOSIS — R9389 Abnormal findings on diagnostic imaging of other specified body structures: Secondary | ICD-10-CM

## 2016-06-24 DIAGNOSIS — R053 Chronic cough: Secondary | ICD-10-CM

## 2016-06-24 DIAGNOSIS — I10 Essential (primary) hypertension: Secondary | ICD-10-CM | POA: Diagnosis not present

## 2016-06-24 NOTE — Progress Notes (Signed)
Subjective:    Patient ID: Wayne Webb, male    DOB: March 26, 1966 .   MRN: [id number]    Brief patient profile:  49 yobm quit smoking 1993 no breathing problem but did have bad pnds eval by ENT > surgery [place] early  90s > much better then worse again since moved to [place] with recurrent cough so referred by Dr Larose Kells for cough 04/05/2015 with prob sarcoid ? Duration on CT 02/15/15    History of Present Illness  04/05/2015 1st [facility] office visit/ Wert   Chief Complaint  Patient presents with  . Pulmonary Consult    Referred by Dr. Larose Kells. Pt c/o cough x 3 yrs- prod in the am with light brown sputum.   cough worse first thing am/ best p sinus surgery - does not wake him prematurely, variably productive year round Not limited by breathing from desired activities rec Stop losartan and start valsartan 80 mg one daily  GERD diet  Please see patient coordinator before you leave today  to schedule CT chest without contrast  and sinus CT same place Please remember to go to the lab department downstairs for your tests - we will call you with the results when they are available.    04/18/2015 f/u ov/Wert re: cough x 20 years/ sarcoid on CT  Chief Complaint  Patient presents with  . Follow-up    Pt states his cough has improved some since the last visit- still coughing up min sputum in the am but it is now clear. No new co's today.   best treatment for his recurrent  Cough has always been treatment for sinus dz Not limited by breathing from desired activities  = ex biking / lots of steps at work no trouble  rec Add pepcid 20 mg one at bedtime and chlortrimeton 4 mg x 1 or 2 to see if helps the am cough(if  two makes you too sleepy just use one) For drainage take chlortrimeton (chlorpheniramine) 4 mg every 4 hours available over the counter (may cause drowsiness)    06/24/2016  f/u ov/Wert re:  Sarcoid in remission/ chronic cough improved off acei/losartani  and here for refill  on diovan Chief Complaint  Patient presents with  . Follow-up    Cough continues to improve. No new co's today.    Not limited by breathing from desired activities    No obvious day to day or daytime variabilty or assoc  cp or chest tightness, subjective wheeze overt sinus or hb symptoms. No unusual exp hx or h/o childhood pna/ asthma or knowledge of premature birth.  Sleeping ok without nocturnal  or early am exacerbation  of respiratory  c/o's or need for noct saba. Also denies any obvious fluctuation of symptoms with weather or environmental changes or other aggravating or alleviating factors except as outlined above   Current Medications, Allergies, Complete Past Medical History, Past Surgical History, Family History, and Social History were reviewed in [employer] record.  ROS  The following are not active complaints unless bolded sore throat, dysphagia, dental problems, itching, sneezing,  nasal congestion or excess/ purulent secretions, ear ache,   fever, chills, sweats, unintended wt loss, pleuritic or exertional cp, hemoptysis,  orthopnea pnd or leg swelling, presyncope, palpitations, abdominal pain, anorexia, nausea, vomiting, diarrhea  or change in bowel or urinary habits, change in stools or urine, dysuria,hematuria,  rash, arthralgias, visual complaints, headache, numbness weakness or ataxia or problems with walking  or coordination,  change in mood/affect or memory.           Objective:   Physical Exam  obese amb bm nad    06/24/2016        216 04/18/2015        220     04/05/15 223 lb (101.152 kg)  03/11/15 218 lb (98.884 kg)  02/15/15 221 lb (100.245 kg)    Vital signs reviewed   HEENT: nl dentition, turbinates, and orophanx. Nl external ear canals without cough reflex   NECK :  without JVD/Nodes/TM/ nl carotid upstrokes bilaterally   LUNGS: no acc muscle use, clear to A and P bilaterally without cough on insp or exp maneuvers   CV:  RRR   no s3 or murmur or increase in P2, no edema   ABD:  soft and nontender with nl excursion in the supine position. No bruits or organomegaly, bowel sounds nl  MS:  warm without deformities, calf tenderness, cyanosis or clubbing  SKIN: warm and dry without lesions    NEURO:  alert, approp, no deficits      I personally reviewed images and agree with radiology impression as follows:  CXR: 02/15/15 Right paratracheal opacity with slight tracheal deviation to the left, not significantly changed since the prior examination from July, 2013. Query vascular anomaly or low grade indolent mediastinal tumor such as thymoma. 2. Pulmonary findings most consistent with UIP/interstitial fibrosis, stable since 2013.        Assessment & Plan:

## 2016-06-24 NOTE — Patient Instructions (Signed)
Ok to refill the diovan but further refills per Dr Larose Kells  If cough flares: Add pepcid 20 mg one at bedtime and chlortrimeton 4 mg x 1 or 2 to see if helps the am cough(if  two makes you to sleepy just use one)  For drainage take chlortrimeton (chlorpheniramine) 4 mg every 4 hours available over the counter (may cause drowsiness)

## 2016-06-26 ENCOUNTER — Encounter: Payer: Self-pay | Admitting: Internal Medicine

## 2016-06-26 NOTE — Assessment & Plan Note (Signed)
-   suspected acei cough 11/2011 > d/c'd - allergy profile 04/05/2015 > Eos 0.2/  IgE 18/ pos RAST dust only  - sinus CT 04/10/15 > neg - trial off cozar 04/18/15   I had an extended final summary discussion with the patient reviewing all relevant studies completed to date and  lasting 15 to 20 minutes of a 25 minute visit on the following issues:    Although even in retrospect it may not be clear the ACEi contributed to the pt's symptoms,  Pt improved off them and adding them back at this point or in the future would risk confusion in interpretation of non-specific respiratory symptoms to which this patient is prone  ie  Better not to muddy the waters here and avoid losartan as well  Further f/u for bp meds per Dr Larose Kells

## 2016-06-26 NOTE — Assessment & Plan Note (Signed)
Try off ACEI 12/12/11 for atypical upper airway symptoms - rec trial off  cozar 04/05/2015 same reason >  improved   Adequate control on present rx, reviewed > no change in rx needed    Follow up per Primary Care planned

## 2016-06-26 NOTE — Assessment & Plan Note (Signed)
-   Evaluated by Dr Annamaria Boots in [place] chest 04/10/15 1. Considerable chronic mediastinal and bilateral hilar adenopathy along with para esophageal and upper abdominal adenopathy, much of which has speckled calcification. Differential diagnostic considerations include sarcoidosis, silicosis, granulomatous process such as tuberculosis or histoplasmosis, or lymphoma, possibly partially treated (correlate with patient history). Adenopathy was noted on the prior chest radiograph of 1999 and accordingly is unlikely to represent active malignancy. 2. Honeycombing in the lung bases with scattered volume loss is specially along the lung bases and right-sided fissures. There is some bronchiectasis noted. These could represent pulmonary manifestations of sarcoidosis, along with slight nodularity along the fissures. 3. Part of the right mediastinal prominence is also attributable to a right-sided aortic arch.   C/w burned out sarcoid/ note cough only symptom and improved with rx for uacs Pulmonary f/u is prn

## 2016-07-04 ENCOUNTER — Ambulatory Visit (HOSPITAL_BASED_OUTPATIENT_CLINIC_OR_DEPARTMENT_OTHER)
Admission: RE | Admit: 2016-07-04 | Discharge: 2016-07-04 | Disposition: A | Discharge status: Home / Self Care | Payer: 59 | Source: Ambulatory Visit | Attending: Endocrinology | Admitting: Endocrinology

## 2016-07-04 DIAGNOSIS — E041 Nontoxic single thyroid nodule: Secondary | ICD-10-CM | POA: Insufficient documentation

## 2016-07-04 DIAGNOSIS — E042 Nontoxic multinodular goiter: Secondary | ICD-10-CM

## 2016-08-15 ENCOUNTER — Other Ambulatory Visit: Payer: Self-pay | Admitting: Endocrinology

## 2016-08-24 ENCOUNTER — Encounter: Payer: Self-pay | Admitting: Internal Medicine

## 2016-08-24 ENCOUNTER — Ambulatory Visit (INDEPENDENT_AMBULATORY_CARE_PROVIDER_SITE_OTHER): Payer: 59 | Admitting: Internal Medicine

## 2016-08-24 VITALS — BP 124/70 | HR 74 | Temp 98.1°F | Resp 14 | Ht 71.0 in | Wt 217.0 lb

## 2016-08-24 DIAGNOSIS — Z0000 Encounter for general adult medical examination without abnormal findings: Secondary | ICD-10-CM | POA: Diagnosis not present

## 2016-08-24 MED ORDER — AZELASTINE HCL 0.1 % NA SOLN: 2.0000 | Freq: Every evening | NASAL | 3 refills | Status: DC | PRN

## 2016-08-24 MED ORDER — VALSARTAN 80 MG PO TABS: 80.0000 mg | ORAL_TABLET | Freq: Every day | ORAL | 3 refills | Status: DC

## 2016-08-24 MED ORDER — SCOPOLAMINE 1 MG/3DAYS TD PT72: 1.0000 | MEDICATED_PATCH | TRANSDERMAL | 0 refills | Status: DC

## 2016-08-24 NOTE — Progress Notes (Signed)
Pre visit review using our clinic review tool, if applicable. No additional management support is needed unless otherwise documented below in the visit note. 

## 2016-08-24 NOTE — Progress Notes (Signed)
Subjective:    Patient ID: Wayne Webb, male    DOB: 1966-02-10, 50 y.o.   MRN: [id number]  DOS:  08/24/2016 Type of visit - description : CPX Interval history: Taking a cruise, seasickness prevention? Patch? He remains very active, going to the gym, jogging sometimes 45 minutes without difficulties. From time to time when he runs upstairs he gets slightly winded.   Review of Systems Constitutional: No fever. No chills. No unexplained wt changes. No unusual sweats  HEENT: No dental problems, no ear discharge, no facial swelling, no voice changes. No eye discharge, no eye  redness , no  intolerance to light   Respiratory: No wheezing , no  difficulty breathing. No cough , no mucus production  Cardiovascular: No CP, no leg swelling , no  Palpitations  GI: no nausea, no vomiting, no diarrhea , no  abdominal pain.  No blood in the stools. No dysphagia, no odynophagia    Endocrine: No polyphagia, no polyuria , no polydipsia  GU: No dysuria, gross hematuria, difficulty urinating. No urinary urgency, no frequency.  Musculoskeletal: No joint swellings or unusual aches or pains  Skin: No change in the color of the skin, palor , no  Rash  Allergic, immunologic: No environmental allergies , no  food allergies  Neurological: No dizziness no  syncope. No headaches. No diplopia, no slurred, no slurred speech, no motor deficits, no facial  Numbness  Hematological: No enlarged lymph nodes, no easy bruising , no unusual bleedings  Psychiatry: No suicidal ideas, no hallucinations, no beavior problems, no confusion.  No unusual/severe anxiety, no depression   Past Medical History:  Diagnosis Date  . ALLERGIC RHINITIS 03/15/2007  . CRI (chronic renal insufficiency)    CRI, creat 1.4 , renal u/s 04-2011 neg  . DIABETES MELLITUS, TYPE II 03/15/2007  . Diplopia 12/24/2009  . ERECTILE DYSFUNCTION 03/15/2007  . GOITER, MULTINODULAR 03/21/2010  . HYPERLIPIDEMIA 03/15/2007    Past Surgical  History:  Procedure Laterality Date  . LASIK  10-2012    Social History   Social History  . Marital status: Married    Spouse name: N/A  . Number of children: 1  . Years of education: N/A   Occupational History  . active job Science writer   Social History Main Topics  . Smoking status: Former Smoker    Packs/day: 0.50    Years: 10.00    Types: Cigarettes    Quit date: 10/26/1993  . Smokeless tobacco: Never Used  . Alcohol use No     Comment:    . Drug use: No  . Sexual activity: Not on file   Other Topics Concern  . Not on file   Social History Narrative   Divorced. Remarried --- pt , wife, wife's daughter      Family History  Problem Relation Age of Onset  . Heart disease Mother     CABG-onset in her early 69's  . Diabetes Mother 21    had DM from her 47's  . Hypertension Mother   . Prostate cancer Father     dx in his 20s  . Stroke Neg Hx   . Colon cancer Neg Hx       Medication List       Accurate as of 08/24/16 11:59 PM. Always use your most recent med list.          aspirin EC 81 MG tablet Take 81 mg by mouth daily.  azelastine 0.1 % nasal spray Commonly known as:  ASTELIN Place 2 sprays into both nostrils at bedtime as needed for rhinitis or allergies.   B-D UF III MINI PEN NEEDLES 31G X 5 MM Misc Generic drug:  Insulin Pen Needle USE AS DIRECTED TWICE DAILY   CREATINE PO Take 1 scoop by mouth daily as needed. Before going to gym   Insulin Lispro Prot & Lispro (75-25) 100 UNIT/ML Kwikpen Commonly known as:  HUMALOG MIX 75/25 KWIKPEN 35  units with breakfast and 20 units with evening meal   ONE TOUCH ULTRA TEST test strip Generic drug:  glucose blood Use 2 times daily as        directed to check blood     sugar   glucose blood test strip Commonly known as:  ONE TOUCH ULTRA TEST USE 2 TIMES DAILY AS DIRECTED TO CHECK BLOOD SUGAR   scopolamine 1 MG/3DAYS Commonly known as:  TRANSDERM-SCOP (1.5 MG) Place 1  patch (1.5 mg total) onto the skin every 3 (three) days.   valsartan 80 MG tablet Commonly known as:  DIOVAN Take 1 tablet (80 mg total) by mouth daily.          Objective:   Physical Exam BP 124/70 (BP Location: Left Arm, Patient Position: Sitting, Cuff Size: Normal)   Pulse 74   Temp 98.1 F (36.7 C) (Oral)   Resp 14   Ht 5\' 11"  (1.803 m)   Wt 217 lb (98.4 kg)   SpO2 98%   BMI 30.27 kg/m   General:   Well developed, well nourished . NAD.  Neck: No  thyromegaly  HEENT:  Normocephalic . Face symmetric, atraumatic Lungs:  CTA B Normal respiratory effort, no intercostal retractions, no accessory muscle use. Heart: RRR,  no murmur.  No pretibial edema bilaterally  Abdomen:  Not distended, soft, non-tender. No rebound or rigidity.   Skin: Exposed areas without rash. Not pale. Not jaundice Neurologic:  alert & oriented X3.  Speech normal, gait appropriate for age and unassisted Strength symmetric and appropriate for age.  Psych: Cognition and judgment appear intact.  Cooperative with normal attention span and concentration.  Behavior appropriate. No anxious or depressed appearing.    Assessment & Plan:   Assessment DM - dr Loanne Drilling Hyperlipidemia Goiter -- per dr Loanne Drilling CKD - renal US (-) 2012  ED Elevated LFTs Ultrasound 2015 negative,  hepatitis serologies (-) 01-2015: ferritin (-) previously slt elevated; wnl  transferrin saturation, ANA, ceruloplasmin, anti smooth muscles ab. Alpha 1 antitrypsin. Referred to GI Pulmonary: --[facility] --Chronic Cough, better after ACEi changed to Diovan 03-2015---Dr. Wert --Abnormal CXR   PLAN:  Hyperlipidemia: Diet control, check labs Elevated LFTs: Was referred to GI, I don't see that he was seen, recheck LFTs and refer to GI if blood work came back abnormal Seasickness prevention: Rx a scopolamine patch, encouraged to use it before his vacation cation to be sure no s/e  RTC one year

## 2016-08-24 NOTE — Assessment & Plan Note (Signed)
Td 2016;  pneumonia shot 23: 2007;  prevnar : 2016; flu shot  @ his job  +family history of prostate cancer , DRE 2016 normal , PSA stable over time Colonoscopy, 02/2014, 1 polyp, next 10 years  Has a very healthy lifestyle. Labs: BMP, AST, ALT, CBC, FLP Start aspirin for cardiovascular protection

## 2016-08-24 NOTE — Patient Instructions (Addendum)
  GO TO THE FRONT DESK Schedule your next appointment for a physical exam in one year   Schedule labs to be done within few days, fasting  Start an aspirin 81 mg once a day with food

## 2016-08-26 NOTE — Assessment & Plan Note (Signed)
Hyperlipidemia: Diet control, check labs Elevated LFTs: Was referred to GI, I don't see that he was seen, recheck LFTs and refer to GI if blood work came back abnormal Seasickness prevention: Rx a scopolamine patch, encouraged to use it before his vacation cation to be sure no s/e  RTC one year

## 2016-08-28 ENCOUNTER — Other Ambulatory Visit (INDEPENDENT_AMBULATORY_CARE_PROVIDER_SITE_OTHER): Payer: 59

## 2016-08-28 DIAGNOSIS — R945 Abnormal results of liver function studies: Principal | ICD-10-CM

## 2016-08-28 DIAGNOSIS — Z0000 Encounter for general adult medical examination without abnormal findings: Secondary | ICD-10-CM | POA: Diagnosis not present

## 2016-08-28 DIAGNOSIS — R7989 Other specified abnormal findings of blood chemistry: Secondary | ICD-10-CM

## 2016-08-28 LAB — BASIC METABOLIC PANEL
BUN: 42 mg/dL — ABNORMAL HIGH (ref 6–23)
CO2: 27 mEq/L (ref 19–32)
Calcium: 10.3 mg/dL (ref 8.4–10.5)
Chloride: 101 mEq/L (ref 96–112)
Creatinine, Ser: 1.57 mg/dL — ABNORMAL HIGH (ref 0.40–1.50)
GFR: 59.84 mL/min — ABNORMAL LOW (ref >60.00)
Glucose, Bld: 173 mg/dL — ABNORMAL HIGH (ref 70–99)
Potassium: 3.9 mEq/L (ref 3.5–5.1)
Sodium: 136 mEq/L (ref 135–145)

## 2016-08-28 LAB — CBC WITH DIFFERENTIAL/PLATELET
Basophils Absolute: 0 10*3/uL (ref 0.0–0.1)
Basophils Relative: 0.5 % (ref 0.0–3.0)
Eosinophils Absolute: 0.2 10*3/uL (ref 0.0–0.7)
Eosinophils Relative: 4.7 % (ref 0.0–5.0)
HCT: 38.6 % — ABNORMAL LOW (ref 39.0–52.0)
Hemoglobin: 13.6 g/dL (ref 13.0–17.0)
Lymphocytes Relative: 19.4 % (ref 12.0–46.0)
Lymphs Abs: 0.9 10*3/uL (ref 0.7–4.0)
MCHC: 35.3 g/dL (ref 30.0–36.0)
MCV: 83.8 fl (ref 78.0–100.0)
Monocytes Absolute: 1.2 10*3/uL — ABNORMAL HIGH (ref 0.1–1.0)
Monocytes Relative: 27.4 % — ABNORMAL HIGH (ref 3.0–12.0)
Neutro Abs: 2.1 10*3/uL (ref 1.4–7.7)
Neutrophils Relative %: 48 % (ref 43.0–77.0)
Platelets: 178 10*3/uL (ref 150.0–400.0)
RBC: 4.6 Mil/uL (ref 4.22–5.81)
RDW: 12 % (ref 11.5–15.5)
WBC: 4.4 10*3/uL (ref 4.0–10.5)

## 2016-08-28 LAB — LIPID PANEL
Cholesterol: 182 mg/dL (ref 0–200)
HDL: 40.5 mg/dL (ref >39.00)
LDL Cholesterol: 111 mg/dL — ABNORMAL HIGH (ref 0–99)
NonHDL: 141.1
Total CHOL/HDL Ratio: 4
Triglycerides: 149 mg/dL (ref 0.0–149.0)
VLDL: 29.8 mg/dL (ref 0.0–40.0)

## 2016-08-28 LAB — AST: AST: 63 U/L — ABNORMAL HIGH (ref 0–37)

## 2016-08-28 LAB — ALT: ALT: 74 U/L — ABNORMAL HIGH (ref 0–53)

## 2016-08-31 ENCOUNTER — Other Ambulatory Visit: Payer: Self-pay | Admitting: Endocrinology

## 2016-08-31 NOTE — Addendum Note (Signed)
Addended byDamita Dunnings D on: 08/31/2016 11:52 AM   Modules accepted: Orders

## 2016-10-31 ENCOUNTER — Emergency Department (HOSPITAL_COMMUNITY)
Admission: EM | Admit: 2016-10-31 | Discharge: 2016-10-31 | Disposition: A | Discharge status: Home / Self Care | Payer: 59 | Attending: Emergency Medicine | Admitting: Emergency Medicine

## 2016-10-31 ENCOUNTER — Emergency Department (HOSPITAL_COMMUNITY): Payer: 59

## 2016-10-31 ENCOUNTER — Encounter (HOSPITAL_COMMUNITY): Payer: Self-pay | Admitting: *Deleted

## 2016-10-31 ENCOUNTER — Telehealth: Payer: Self-pay | Admitting: Family Medicine

## 2016-10-31 DIAGNOSIS — Z87891 Personal history of nicotine dependence: Secondary | ICD-10-CM | POA: Diagnosis not present

## 2016-10-31 DIAGNOSIS — Z794 Long term (current) use of insulin: Secondary | ICD-10-CM | POA: Insufficient documentation

## 2016-10-31 DIAGNOSIS — R739 Hyperglycemia, unspecified: Secondary | ICD-10-CM

## 2016-10-31 DIAGNOSIS — I10 Essential (primary) hypertension: Secondary | ICD-10-CM | POA: Diagnosis not present

## 2016-10-31 DIAGNOSIS — Z7982 Long term (current) use of aspirin: Secondary | ICD-10-CM | POA: Insufficient documentation

## 2016-10-31 DIAGNOSIS — R05 Cough: Secondary | ICD-10-CM | POA: Diagnosis not present

## 2016-10-31 DIAGNOSIS — E1165 Type 2 diabetes mellitus with hyperglycemia: Secondary | ICD-10-CM | POA: Insufficient documentation

## 2016-10-31 LAB — CBC
HCT: 39 % (ref 39.0–52.0)
Hemoglobin: 14 g/dL (ref 13.0–17.0)
MCH: 29.5 pg (ref 26.0–34.0)
MCHC: 35.9 g/dL (ref 30.0–36.0)
MCV: 82.1 fL (ref 78.0–100.0)
Platelets: 190 10*3/uL (ref 150–400)
RBC: 4.75 MIL/uL (ref 4.22–5.81)
RDW: 11.9 % (ref 11.5–15.5)
WBC: 4.6 10*3/uL (ref 4.0–10.5)

## 2016-10-31 LAB — URINALYSIS, ROUTINE W REFLEX MICROSCOPIC
Bacteria, UA: NONE SEEN
Bilirubin Urine: NEGATIVE
Glucose, UA: >=500 mg/dL — AB
Hgb urine dipstick: NEGATIVE
Ketones, ur: NEGATIVE mg/dL
Leukocytes, UA: NEGATIVE
Nitrite: NEGATIVE
Protein, ur: NEGATIVE mg/dL
Specific Gravity, Urine: 1.021 (ref 1.005–1.030)
WBC, UA: NONE SEEN WBC/hpf (ref 0–5)
pH: 6 (ref 5.0–8.0)

## 2016-10-31 LAB — COMPREHENSIVE METABOLIC PANEL
ALT: 62 U/L (ref 17–63)
AST: 42 U/L — ABNORMAL HIGH (ref 15–41)
Albumin: 4 g/dL (ref 3.5–5.0)
Alkaline Phosphatase: 160 U/L — ABNORMAL HIGH (ref 38–126)
Anion gap: 11 (ref 5–15)
BUN: 36 mg/dL — ABNORMAL HIGH (ref 6–20)
CO2: 23 mmol/L (ref 22–32)
Calcium: 9.7 mg/dL (ref 8.9–10.3)
Chloride: 96 mmol/L — ABNORMAL LOW (ref 101–111)
Creatinine, Ser: 1.75 mg/dL — ABNORMAL HIGH (ref 0.61–1.24)
GFR calc Af Amer: 50 mL/min — ABNORMAL LOW (ref >60)
GFR calc non Af Amer: 43 mL/min — ABNORMAL LOW (ref >60)
Glucose, Bld: 566 mg/dL (ref 65–99)
Potassium: 4.8 mmol/L (ref 3.5–5.1)
Sodium: 130 mmol/L — ABNORMAL LOW (ref 135–145)
Total Bilirubin: 0.8 mg/dL (ref 0.3–1.2)
Total Protein: 8.3 g/dL — ABNORMAL HIGH (ref 6.5–8.1)

## 2016-10-31 LAB — CBG MONITORING, ED
Glucose-Capillary: 507 mg/dL (ref 65–99)
Glucose-Capillary: 432 mg/dL — ABNORMAL HIGH (ref 65–99)
Glucose-Capillary: 392 mg/dL — ABNORMAL HIGH (ref 65–99)

## 2016-10-31 MED ORDER — SODIUM CHLORIDE 0.9 % IV BOLUS (SEPSIS): 2000.0000 mL | Freq: Once | INTRAVENOUS | Status: DC

## 2016-10-31 MED ORDER — INSULIN ASPART 100 UNIT/ML ~~LOC~~ SOLN: 5.0000 [IU] | Freq: Once | SUBCUTANEOUS | Status: DC

## 2016-10-31 NOTE — Discharge Instructions (Signed)
Please read and follow all provided instructions.  Your diagnoses today include:  1. Hyperglycemia     Tests performed today include: Vital signs. See below for your results today.   Medications prescribed:  Take as prescribed  Increase Insulin to 40U in the morning and 25U in the evening.   Home care instructions:  Follow any educational materials contained in this packet.  Follow-up instructions: Please follow-up with your primary care provider for further evaluation of symptoms and treatment   Return instructions:  Please return to the Emergency Department if you do not get better, if you get worse, or new symptoms OR  - Fever (temperature greater than 101.50F)  - Bleeding that does not stop with holding pressure to the area    -Severe pain (please note that you may be more sore the day after your accident)  - Chest Pain  - Difficulty breathing  - Severe nausea or vomiting  - Inability to tolerate food and liquids  - Passing out  - Skin becoming red around your wounds  - Change in mental status (confusion or lethargy)  - New numbness or weakness    Please return if you have any other emergent concerns.  Additional Information:  Your vital signs today were: BP 126/100    Pulse 66    Temp 97.8 F (36.6 C) (Oral)    Resp 16    Ht 6' (1.829 m)    Wt 95.7 kg    SpO2 95%    BMI 28.62 kg/m  If your blood pressure (BP) was elevated above 135/85 this visit, please have this repeated by your doctor within one month. ---------------

## 2016-10-31 NOTE — ED Provider Notes (Signed)
[facility] DEPT Provider Note   CSN: [id number] Arrival date & time: 10/31/16  1541  History   Chief Complaint Chief Complaint  Patient presents with  . Hyperglycemia    HPI Wayne Webb is a 51 y.o. male.  HPI  51 y.o. male with a hx of DM2, presents to the Emergency Department today complaining of hyperglycemia today. Noted glucose reading >600 on home monitor. Notes no headaches. No vision changes. No abdominal pain. No N/V. No CP/SOB. Endorses URI symptoms x several weeks with productive cough. No fevers. Pt states that every time around this part of the year his blood sugar goes up and he does not know why. Takes humalog in morning 35 U as well as 20 U in evening. No other symptoms noted.   Past Medical History:  Diagnosis Date  . ALLERGIC RHINITIS 03/15/2007  . CRI (chronic renal insufficiency)    CRI, creat 1.4 , renal u/s 04-2011 neg  . DIABETES MELLITUS, TYPE II 03/15/2007  . Diplopia 12/24/2009  . ERECTILE DYSFUNCTION 03/15/2007  . GOITER, MULTINODULAR 03/21/2010  . HYPERLIPIDEMIA 03/15/2007    Patient Active Problem List   Diagnosis Date Noted  . Diabetes ([place]) 02/22/2016  . PCP NOTES >>>>>>>>>>>>>>>>>>>>>>>>>>>>>>> 12/16/2015  . Chronic cough 04/05/2015  . Elevated LFTs 01/03/2014  . Essential hypertension 12/12/2011  . Abnormal CXR 12/12/2011  . Annual physical exam 11/20/2011  . DOE (dyspnea on exertion) 11/20/2011  . Eye exam abnormal 11/20/2011  . GOITER, MULTINODULAR 03/21/2010  . HYPERLIPIDEMIA 03/15/2007  . ERECTILE DYSFUNCTION 03/15/2007  . ALLERGIC RHINITIS 03/15/2007    Past Surgical History:  Procedure Laterality Date  . LASIK  10-2012       Home Medications    Prior to Admission medications   Medication Sig Start Date End Date Taking? Authorizing Provider  aspirin EC 81 MG tablet Take 81 mg by mouth daily.    Historical Provider, MD  azelastine (ASTELIN) 0.1 % nasal spray Place 2 sprays into both nostrils at bedtime as needed for  rhinitis or allergies. 08/24/16   Colon Branch, MD  B-D UF III MINI PEN NEEDLES 31G X 5 MM MISC USE AS DIRECTED TWICE DAILY Patient not taking: Reported on 08/24/2016 08/15/16   Renato Shin, MD  CREATINE PO Take 1 scoop by mouth daily as needed. Before going to gym    Historical Provider, MD  glucose blood (ONE TOUCH ULTRA TEST) test strip USE 2 TIMES DAILY AS DIRECTED TO CHECK BLOOD SUGAR Patient not taking: Reported on 08/24/2016 01/13/16   Renato Shin, MD  Insulin Lispro Prot & Lispro (HUMALOG MIX 75/25 KWIKPEN) (75-25) 100 UNIT/ML Kwikpen 35  units with breakfast and 20 units with evening meal 06/22/16   Renato Shin, MD  ONE TOUCH ULTRA TEST test strip USE 2 TIMES DAILY AS  DIRECTED TO CHECK BLOOD  SUGAR 08/31/16   Renato Shin, MD  scopolamine (TRANSDERM-SCOP, 1.5 MG,) 1 MG/3DAYS Place 1 patch (1.5 mg total) onto the skin every 3 (three) days. 08/24/16   Colon Branch, MD  valsartan (DIOVAN) 80 MG tablet Take 1 tablet (80 mg total) by mouth daily. 08/24/16   Colon Branch, MD    Family History Family History  Problem Relation Age of Onset  . Heart disease Mother     CABG-onset in her early 48's  . Diabetes Mother 85    had DM from her 1's  . Hypertension Mother   . Prostate cancer Father     dx in his  27s  . Stroke Neg Hx   . Colon cancer Neg Hx     Social History Social History  Substance Use Topics  . Smoking status: Former Smoker    Packs/day: 0.50    Years: 10.00    Types: Cigarettes    Quit date: 10/26/1993  . Smokeless tobacco: Never Used  . Alcohol use No     Comment:       Allergies   Patient has no known allergies.   Review of Systems Review of Systems ROS reviewed and all are negative for acute change except as noted in the HPI.  Physical Exam Updated Vital Signs BP 113/84 (BP Location: Right Arm)   Pulse 87   Temp 97.9 F (36.6 C) (Oral)   Resp 18   Ht 6' (1.829 m)   Wt 95.7 kg   SpO2 95%   BMI 28.62 kg/m   Physical Exam  Constitutional: He is  oriented to person, place, and time. Vital signs are normal. He appears well-developed and well-nourished. No distress.  NAD. Resting comfortably.   HENT:  Head: Normocephalic and atraumatic.  Right Ear: Hearing, tympanic membrane, external ear and ear canal normal.  Left Ear: Hearing, tympanic membrane, external ear and ear canal normal.  Nose: Nose normal.  Mouth/Throat: Uvula is midline, oropharynx is clear and moist and mucous membranes are normal. No trismus in the jaw. No oropharyngeal exudate, posterior oropharyngeal erythema or tonsillar abscesses.  Eyes: Conjunctivae and EOM are normal. Pupils are equal, round, and reactive to light.  Neck: Normal range of motion. Neck supple. No tracheal deviation present.  Cardiovascular: Normal rate, regular rhythm, S1 normal, S2 normal, normal heart sounds, intact distal pulses and normal pulses.   Pulmonary/Chest: Effort normal and breath sounds normal. No respiratory distress. He has no decreased breath sounds. He has no wheezes. He has no rhonchi. He has no rales.  Abdominal: Normal appearance and bowel sounds are normal. There is no tenderness.  Musculoskeletal: Normal range of motion.  Neurological: He is alert and oriented to person, place, and time.  Skin: Skin is warm and dry.  Psychiatric: He has a normal mood and affect. His speech is normal and behavior is normal. Thought content normal.   ED Treatments / Results  Labs (all labs ordered are listed, but only abnormal results are displayed) Labs Reviewed  URINALYSIS, ROUTINE W REFLEX MICROSCOPIC - Abnormal; Notable for the following:       Result Value   Color, Urine COLORLESS (*)    Glucose, UA >=500 (*)    Squamous Epithelial / LPF 0-5 (*)    All other components within normal limits  COMPREHENSIVE METABOLIC PANEL - Abnormal; Notable for the following:    Sodium 130 (*)    Chloride 96 (*)    Glucose, Bld 566 (*)    BUN 36 (*)    Creatinine, Ser 1.75 (*)    Total Protein 8.3  (*)    AST 42 (*)    Alkaline Phosphatase 160 (*)    GFR calc non Af Amer 43 (*)    GFR calc Af Amer 50 (*)    All other components within normal limits  CBG MONITORING, ED - Abnormal; Notable for the following:    Glucose-Capillary 507 (*)    All other components within normal limits  CBG MONITORING, ED - Abnormal; Notable for the following:    Glucose-Capillary 432 (*)    All other components within normal limits  CBC  EKG  EKG Interpretation None      Radiology No results found.  Procedures Procedures (including critical care time)  Medications Ordered in ED Medications - No data to display   Initial Impression / Assessment and Plan / ED Course  I have reviewed the triage vital signs and the nursing notes.  Pertinent labs & imaging results that were available during my care of the patient were reviewed by me and considered in my medical decision making (see chart for details).  Clinical Course    Final Clinical Impressions(s) / ED Diagnoses  {I have reviewed and evaluated the relevant laboratory values. {I have reviewed and evaluated the relevant imaging studies.  {I have reviewed the relevant previous healthcare records.  {I obtained HPI from historian. {Patient discussed with supervising physician.  ED Course:  Assessment: Pt is a 50yM with hx DM2 who presents with hyperglycemia today. URI symptoms x several weeks. On exam, pt in NAD. Nontoxic/nonseptic appearing. VSS. Afebrile. Lungs CTA. Heart RRR. Abdomen nontender soft. POC Glucose 507. CBC unremarkable. Potassium 4.8. Gap 11. UA without ketouria. No evidence of DKA. Pt well appearing. Counseled to increase Insulin to 40U in AM and 25U in PM. CXR unremarkable. Plan is to DC home with follow up to PCP. Seen by supervising physician who agrees with assessment at plan. At time of discharge, Patient is in no acute distress. Vital Signs are stable. Patient is able to ambulate. Patient able to tolerate PO.    Disposition/Plan:  DC Home Additional Verbal discharge instructions given and discussed with patient.  Pt Instructed to f/u with PCP in the next week for evaluation and treatment of symptoms. Return precautions given Pt acknowledges and agrees with plan  Supervising Physician Nat Christen, MD  Final diagnoses:  Hyperglycemia    New Prescriptions New Prescriptions   No medications on file     Shary Decamp, PA-C 10/31/16 [place], MD 11/01/16 [phone]

## 2016-10-31 NOTE — ED Triage Notes (Signed)
Pt states he has had a cold and he felt flushed last night.  He took his bs and it was over 600.  Denies any other s/s.  CBG 500 in triage.

## 2016-10-31 NOTE — ED Notes (Signed)
Pt transported to xray 

## 2016-10-31 NOTE — Telephone Encounter (Signed)
5:46 am: Caller from [facility] call service calling in regard to Wayne Webb. Hx of DM II, reporting BS 570 around 2 am, gave himself 10 U of insulin. Re-checked BS a few minutes before phone call and still elevated at 545. Pt is asymptomatic. Instructed about warning signs, continue monitoring BS, and call Monday 11/03/15 first thing in the morning to schedule an acute visit.  2:23 pm I called Wayne Wayne Webb to follow on earlier phone call. He tells me that he has noted his BS's progressively getting worse since 08/2016. He came back from vacation cruise and started with cold like symptoms. He took OTC cold medication and since then his BS's are elevated. Respiratory symptoms have resolved. He follows with Dr Loanne Drilling and states that he is planning on setting up appt. According to pt, next f/u appt is at the end of this month or beginning of February/2018.  He denies fever,chills, fatigue, abdominal pain, nausea,vomiting,or urinary symptoms.  Clearly instructed about warning signs, in which case he needs to seem immediate medical attention. He voices understanding and agrees with plan.  Wayne Neyhart Martinique, MD

## 2016-10-31 NOTE — ED Notes (Signed)
Elevated blood sugar 566 reported to dr cookd  [employer] 2  Next to go back

## 2016-11-02 ENCOUNTER — Telehealth: Payer: Self-pay | Admitting: Endocrinology

## 2016-11-02 NOTE — Telephone Encounter (Signed)
Please increase insulin to 45 units with breakfast, and 35 with supper. Ov 2-3 days. Is there a reason for your blood sugar being high, such as steroids?

## 2016-11-02 NOTE — Telephone Encounter (Signed)
[facility] Call: Caller states his BS reading is HI on his glucometer and it only goes up to 600.

## 2016-11-02 NOTE — Telephone Encounter (Signed)
See message and please advise. The blood sugar reading read 280 this am not 28. Thanks!

## 2016-11-02 NOTE — Telephone Encounter (Signed)
I contacted the patient and advised of message patient stated he has not had any steroids but he has been having cold like symptoms since the beginning of December. Patient voiced understanding on new instructions and will come for his f/u visit on 10/28/2016.

## 2016-11-02 NOTE — Telephone Encounter (Signed)
Pt called in to let Dr. Loanne Drilling know about his extremely high sugars from over the weekend, he said he did have to go to the ER and that they did do some blood work on him.  He said that this morning his sugar was 28, and that he used 7 more units than normal.  He would like to know if Dr. Loanne Drilling will want to adjust his insulin.

## 2016-11-04 ENCOUNTER — Ambulatory Visit (INDEPENDENT_AMBULATORY_CARE_PROVIDER_SITE_OTHER): Payer: 59 | Admitting: Endocrinology

## 2016-11-04 VITALS — BP 122/70 | HR 80 | Ht 71.0 in | Wt 220.0 lb

## 2016-11-04 DIAGNOSIS — N183 Chronic kidney disease, stage 3 unspecified: Secondary | ICD-10-CM

## 2016-11-04 DIAGNOSIS — Z794 Long term (current) use of insulin: Secondary | ICD-10-CM | POA: Diagnosis not present

## 2016-11-04 DIAGNOSIS — E1122 Type 2 diabetes mellitus with diabetic chronic kidney disease: Secondary | ICD-10-CM

## 2016-11-04 LAB — POCT GLYCOSYLATED HEMOGLOBIN (HGB A1C): Hemoglobin A1C: 11

## 2016-11-04 MED ORDER — INSULIN LISPRO PROT & LISPRO (75-25 MIX) 100 UNIT/ML KWIKPEN: PEN_INJECTOR | SUBCUTANEOUS | 11 refills | Status: DC

## 2016-11-04 NOTE — Patient Instructions (Addendum)
check your blood sugar twice a day.  vary the time of day when you check, between before the 3 meals, and at bedtime.  also check if you have symptoms of your blood sugar being too high or too low.  please keep a record of the readings and bring it to your next appointment here (or you can bring the meter itself).  You can write it on any piece of paper.  please call us sooner if your blood sugar goes below 70, or if you have a lot of readings over 200. Please increase the insulin to 55 units with breakfast, and 45 with supper.   On this type of insulin schedule, you should eat meals on a regular schedule.  If a meal is missed or significantly delayed, your blood sugar could go low. Please call us next week, to tell us how the blood sugar is doing.   Please come back for a follow-up appointment in 2 months.

## 2016-11-04 NOTE — Progress Notes (Signed)
Subjective:    Patient ID: Wayne Webb, male    DOB: [birth year], 51 y.o.   MRN: [id number]  HPI Pt returns for f/u of diabetes mellitus: DM type: Insulin-requiring type 2 Dx'ed: [id number] Complications: retinopathy and renal insuff.   Therapy: insulin since 2011.  DKA: never Severe hypoglycemia: never.  Pancreatitis: never Other: he declines multiple daily injections.  Interval history: He was seen in ER for severe hyperglycemia.   insulin was increased to 45 units with breakfast, and 35 with supper.  no cbg record, but states cbg's vary from 200-400.  It is in general higher as the day goes on.  He says he never misses the insulin. Past Medical History:  Diagnosis Date  . ALLERGIC RHINITIS 03/15/2007  . CRI (chronic renal insufficiency)    CRI, creat 1.4 , renal u/s 04-2011 neg  . DIABETES MELLITUS, TYPE II 03/15/2007  . Diplopia 12/24/2009  . ERECTILE DYSFUNCTION 03/15/2007  . GOITER, MULTINODULAR 03/21/2010  . HYPERLIPIDEMIA 03/15/2007    Past Surgical History:  Procedure Laterality Date  . LASIK  10-2012    Social History   Social History  . Marital status: Married    Spouse name: N/A  . Number of children: 1  . Years of education: N/A   Occupational History  . active job Science writer   Social History Main Topics  . Smoking status: Former Smoker    Packs/day: 0.50    Years: 10.00    Types: Cigarettes    Quit date: 10/26/1993  . Smokeless tobacco: Never Used  . Alcohol use No     Comment:    . Drug use: No  . Sexual activity: Not on file   Other Topics Concern  . Not on file   Social History Narrative   Divorced. Remarried --- pt , wife, wife's daughter     Current Outpatient Prescriptions on File Prior to Visit  Medication Sig Dispense Refill  . aspirin EC 81 MG tablet Take 81 mg by mouth daily.    Marland Kitchen azelastine (ASTELIN) 0.1 % nasal spray Place 2 sprays into both nostrils at bedtime as needed for rhinitis or allergies. 90 mL 3  .  OVER THE COUNTER MEDICATION Take 4 capsules by mouth daily. Nitric oxide supplement    . OVER THE COUNTER MEDICATION Take 1 application by mouth daily as needed. Pre workout vitamin supplement mix    . Protein POWD Take 1 scoop by mouth 2 (two) times daily.    . valsartan (DIOVAN) 80 MG tablet Take 1 tablet (80 mg total) by mouth daily. 90 tablet 3   No current facility-administered medications on file prior to visit.     No Known Allergies  Family History  Problem Relation Age of Onset  . Heart disease Mother     CABG-onset in her early 1's  . Diabetes Mother 64    had DM from her 90's  . Hypertension Mother   . Prostate cancer Father     dx in his 42s  . Stroke Neg Hx   . Colon cancer Neg Hx     BP 122/70   Pulse 80   Ht 5\' 11"  (1.803 m)   Wt 220 lb (99.8 kg)   SpO2 94%   BMI 30.68 kg/m    Review of Systems He denies hypoglycemia    Objective:   Physical Exam VITAL SIGNS:  See vs page GENERAL: no distress Pulses: dorsalis pedis intact bilat.  MSK: no deformity of the feet CV: no leg edema Skin:  no ulcer on the feet.  normal color and temp on the feet.  Neuro: sensation is intact to touch on the feet.    Lab Results  Component Value Date   HGBA1C 11.0 11/04/2016      Assessment & Plan:  Insulin-requiring type 2 DM: poor control.  He needs to change to QD insulin, but he wants to use up 25 pens of current insulin first.    Patient is advised the following: Patient Instructions  check your blood sugar twice a day.  vary the time of day when you check, between before the 3 meals, and at bedtime.  also check if you have symptoms of your blood sugar being too high or too low.  please keep a record of the readings and bring it to your next appointment here (or you can bring the meter itself).  You can write it on any piece of paper.  please call us sooner if your blood sugar goes below 70, or if you have a lot of readings over 200. Please increase the insulin to  55 units with breakfast, and 45 with supper.   On this type of insulin schedule, you should eat meals on a regular schedule.  If a meal is missed or significantly delayed, your blood sugar could go low. Please call us next week, to tell us how the blood sugar is doing.   Please come back for a follow-up appointment in 2 months.

## 2016-12-09 ENCOUNTER — Other Ambulatory Visit: Payer: Self-pay | Admitting: Endocrinology

## 2016-12-23 ENCOUNTER — Ambulatory Visit: Payer: 59 | Admitting: Endocrinology

## 2017-01-01 ENCOUNTER — Ambulatory Visit: Payer: 59 | Admitting: Endocrinology

## 2017-01-14 ENCOUNTER — Telehealth: Payer: Self-pay | Admitting: Endocrinology

## 2017-01-14 NOTE — Telephone Encounter (Signed)
Patient notified of message and voiced understanding.  

## 2017-01-14 NOTE — Telephone Encounter (Signed)
I contacted the patient. He stated at his last office visit it had been mentioned about changing insulins. Patient stated at this time he could not come in for a office visit because he has other medical bills to take care of first. Pateint stated he has 4 pens left of his Humalog 75/25. Patient wanted to confirm if he should refill his humalog until he is able to be seen or if we should change the insulin? Please advise, Thanks!

## 2017-01-14 NOTE — Telephone Encounter (Signed)
I contacted the patient and advised of message. Patient is not having trouble picking his insulin up. He stated last visit he remembered discussing a long acting insulin that could be prescribed to take the place on the humalog 75/25? Please advise, Thanks!

## 2017-01-14 NOTE — Telephone Encounter (Signed)
Just come on back when you can, and we'll address your needs then.

## 2017-01-14 NOTE — Telephone Encounter (Signed)
Pt called in and has some questions regarding his insulins, he requests call back.

## 2017-01-14 NOTE — Telephone Encounter (Signed)
The cheapest option to your insulin is 70/30 insulin from [facility].

## 2017-02-19 DIAGNOSIS — E119 Type 2 diabetes mellitus without complications: Secondary | ICD-10-CM | POA: Diagnosis not present

## 2017-04-26 ENCOUNTER — Encounter: Payer: Self-pay | Admitting: Endocrinology

## 2017-04-26 ENCOUNTER — Ambulatory Visit (INDEPENDENT_AMBULATORY_CARE_PROVIDER_SITE_OTHER): Payer: 59 | Admitting: Endocrinology

## 2017-04-26 VITALS — BP 122/82 | HR 75 | Ht 71.0 in | Wt 218.0 lb

## 2017-04-26 DIAGNOSIS — E1122 Type 2 diabetes mellitus with diabetic chronic kidney disease: Secondary | ICD-10-CM | POA: Diagnosis not present

## 2017-04-26 DIAGNOSIS — N183 Chronic kidney disease, stage 3 unspecified: Secondary | ICD-10-CM

## 2017-04-26 DIAGNOSIS — Z794 Long term (current) use of insulin: Secondary | ICD-10-CM

## 2017-04-26 LAB — POCT GLYCOSYLATED HEMOGLOBIN (HGB A1C): Hemoglobin A1C: 6.7

## 2017-04-26 MED ORDER — INSULIN LISPRO PROT & LISPRO (75-25 MIX) 100 UNIT/ML KWIKPEN: PEN_INJECTOR | SUBCUTANEOUS | 11 refills | Status: DC

## 2017-04-26 NOTE — Patient Instructions (Addendum)
check your blood sugar twice a day.  vary the time of day when you check, between before the 3 meals, and at bedtime.  also check if you have symptoms of your blood sugar being too high or too low.  please keep a record of the readings and bring it to your next appointment here (or you can bring the meter itself).  You can write it on any piece of paper.  please call us sooner if your blood sugar goes below 70, or if you have a lot of readings over 200. Please reduce the insulin to 50 units with breakfast, and 45 with supper.   On this type of insulin schedule, you should eat meals on a regular schedule.  If a meal is missed or significantly delayed, your blood sugar could go low.   Please come back for a follow-up appointment in 4 months.

## 2017-04-26 NOTE — Progress Notes (Signed)
Subjective:    Patient ID: Wayne Webb, male    DOB: 11/07/1965, 51 y.o.   MRN: [id number]  HPI Pt returns for f/u of diabetes mellitus: DM type: Insulin-requiring type 2 Dx'ed: 0102 Complications: retinopathy and renal insuff.   Therapy: insulin since 2011.  DKA: never Severe hypoglycemia: never.  Pancreatitis: never Other: he declines multiple daily injections.  Interval history: no cbg record, but states cbg's are well-controlled.  There is no trend throughout the day.  He says he never misses the insulin. pt states he feels well in general.   Past Medical History:  Diagnosis Date  . ALLERGIC RHINITIS 03/15/2007  . CRI (chronic renal insufficiency)    CRI, creat 1.4 , renal u/s 04-2011 neg  . DIABETES MELLITUS, TYPE II 03/15/2007  . Diplopia 12/24/2009  . ERECTILE DYSFUNCTION 03/15/2007  . GOITER, MULTINODULAR 03/21/2010  . HYPERLIPIDEMIA 03/15/2007    Past Surgical History:  Procedure Laterality Date  . LASIK  10-2012    Social History   Social History  . Marital status: Married    Spouse name: N/A  . Number of children: 1  . Years of education: N/A   Occupational History  . active job Science writer   Social History Main Topics  . Smoking status: Former Smoker    Packs/day: 0.50    Years: 10.00    Types: Cigarettes    Quit date: 10/26/1993  . Smokeless tobacco: Never Used  . Alcohol use No     Comment:    . Drug use: No  . Sexual activity: Not on file   Other Topics Concern  . Not on file   Social History Narrative   Divorced. Remarried --- pt , wife, wife's daughter     Current Outpatient Prescriptions on File Prior to Visit  Medication Sig Dispense Refill  . aspirin EC 81 MG tablet Take 81 mg by mouth daily.    Marland Kitchen azelastine (ASTELIN) 0.1 % nasal spray Place 2 sprays into both nostrils at bedtime as needed for rhinitis or allergies. 90 mL 3  . B-D UF III MINI PEN NEEDLES 31G X 5 MM MISC USE AS DIRECTED TWICE DAILY 200 each 1    . OVER THE COUNTER MEDICATION Take 4 capsules by mouth daily. Nitric oxide supplement    . OVER THE COUNTER MEDICATION Take 1 application by mouth daily as needed. Pre workout vitamin supplement mix    . Protein POWD Take 1 scoop by mouth 2 (two) times daily.    . valsartan (DIOVAN) 80 MG tablet Take 1 tablet (80 mg total) by mouth daily. 90 tablet 3   No current facility-administered medications on file prior to visit.     No Known Allergies  Family History  Problem Relation Age of Onset  . Heart disease Mother        CABG-onset in her early 86's  . Diabetes Mother 76       had DM from her 10's  . Hypertension Mother   . Prostate cancer Father        dx in his 53s  . Stroke Neg Hx   . Colon cancer Neg Hx     BP 122/82   Pulse 75   Ht 5\' 11"  (1.803 m)   Wt 218 lb (98.9 kg)   SpO2 94%   BMI 30.40 kg/m    Review of Systems He denies hypoglycemia.    Objective:   Physical Exam VITAL  SIGNS:  See vs page GENERAL: no distress Pulses: dorsalis pedis intact bilat.   MSK: no deformity of the feet CV: no leg edema Skin:  no ulcer on the feet.  normal color and temp on the feet. Neuro: sensation is intact to touch on the feet.   A1c=6.7%  Lab Results  Component Value Date   CREATININE 1.75 (H) 10/31/2016   BUN 36 (H) 10/31/2016   NA 130 (L) 10/31/2016   K 4.8 10/31/2016   CL 96 (L) 10/31/2016   CO2 23 10/31/2016      Assessment & Plan:  Insulin-requiring type 2 DM, with DR: slightly overcontrolled.   Patient Instructions  check your blood sugar twice a day.  vary the time of day when you check, between before the 3 meals, and at bedtime.  also check if you have symptoms of your blood sugar being too high or too low.  please keep a record of the readings and bring it to your next appointment here (or you can bring the meter itself).  You can write it on any piece of paper.  please call us sooner if your blood sugar goes below 70, or if you have a lot of readings  over 200. Please reduce the insulin to 50 units with breakfast, and 45 with supper.   On this type of insulin schedule, you should eat meals on a regular schedule.  If a meal is missed or significantly delayed, your blood sugar could go low.   Please come back for a follow-up appointment in 4 months.

## 2017-06-10 ENCOUNTER — Telehealth: Payer: Self-pay

## 2017-06-10 MED ORDER — LOSARTAN POTASSIUM 50 MG PO TABS: 50.0000 mg | ORAL_TABLET | Freq: Every day | ORAL | 0 refills | Status: DC

## 2017-06-10 NOTE — Telephone Encounter (Signed)
Valsartan d/c, Losartan 50mg  1 tab daily sent to [facility]. MyChart message sent to Pt regarding med change- instructed to call office to schedule CPE for 07/2017.

## 2017-06-10 NOTE — Telephone Encounter (Signed)
Advise patient: Switch to losartan 50 mg. One tablet daily #90 no refills. Watch his BPs to be sure they remain controlled Needs to be seen in October for a CPX, be sure she has an appointment because will need a BMP at that time

## 2017-06-10 NOTE — Telephone Encounter (Signed)
Pt on valsartan 80mg - please advise.

## 2017-07-01 DIAGNOSIS — K219 Gastro-esophageal reflux disease without esophagitis: Secondary | ICD-10-CM | POA: Diagnosis not present

## 2017-07-01 DIAGNOSIS — K2900 Acute gastritis without bleeding: Secondary | ICD-10-CM | POA: Diagnosis not present

## 2017-08-27 ENCOUNTER — Ambulatory Visit: Payer: 59 | Admitting: Endocrinology

## 2017-09-05 ENCOUNTER — Other Ambulatory Visit: Payer: Self-pay | Admitting: Endocrinology

## 2017-09-13 ENCOUNTER — Other Ambulatory Visit: Payer: Self-pay | Admitting: Internal Medicine

## 2017-09-23 ENCOUNTER — Ambulatory Visit (INDEPENDENT_AMBULATORY_CARE_PROVIDER_SITE_OTHER): Payer: 59 | Admitting: Endocrinology

## 2017-09-23 ENCOUNTER — Encounter: Payer: Self-pay | Admitting: Endocrinology

## 2017-09-23 VITALS — BP 120/62 | HR 67 | Wt 218.4 lb

## 2017-09-23 DIAGNOSIS — Z794 Long term (current) use of insulin: Secondary | ICD-10-CM | POA: Diagnosis not present

## 2017-09-23 DIAGNOSIS — N183 Chronic kidney disease, stage 3 unspecified: Secondary | ICD-10-CM

## 2017-09-23 DIAGNOSIS — E1122 Type 2 diabetes mellitus with diabetic chronic kidney disease: Secondary | ICD-10-CM

## 2017-09-23 LAB — POCT GLYCOSYLATED HEMOGLOBIN (HGB A1C): Hemoglobin A1C: 8.5

## 2017-09-23 MED ORDER — INSULIN LISPRO PROT & LISPRO (75-25 MIX) 100 UNIT/ML KWIKPEN: PEN_INJECTOR | SUBCUTANEOUS | 11 refills | Status: DC

## 2017-09-23 NOTE — Progress Notes (Signed)
Subjective:    Patient ID: Wayne Webb, male    DOB: 10/30/1965, 51 y.o.   MRN: [id number]  HPI Pt returns for f/u of diabetes mellitus: DM type: Insulin-requiring type 2 Dx'ed: 1884 Complications: retinopathy and renal insuff.   Therapy: insulin since 2011.  DKA: never Severe hypoglycemia: never.  Pancreatitis: never Other: he declines multiple daily injections.  Interval history: no cbg record, but states cbg's vary from 90-220.  There is no trend throughout the day.  He says he never misses the insulin.  pt states he feels well in general.  He now works 3rd shift.   Past Medical History:  Diagnosis Date  . ALLERGIC RHINITIS 03/15/2007  . CRI (chronic renal insufficiency)    CRI, creat 1.4 , renal u/s 04-2011 neg  . DIABETES MELLITUS, TYPE II 03/15/2007  . Diplopia 12/24/2009  . ERECTILE DYSFUNCTION 03/15/2007  . GOITER, MULTINODULAR 03/21/2010  . HYPERLIPIDEMIA 03/15/2007    Past Surgical History:  Procedure Laterality Date  . LASIK  10-2012    Social History   Socioeconomic History  . Marital status: Married    Spouse name: Not on file  . Number of children: 1  . Years of education: Not on file  . Highest education level: Not on file  Social Needs  . Financial resource strain: Not on file  . Food insecurity - worry: Not on file  . Food insecurity - inability: Not on file  . Transportation needs - medical: Not on file  . Transportation needs - non-medical: Not on file  Occupational History  . Occupation: active job    Fish farm manager: [employer]    Comment: [employer]  Tobacco Use  . Smoking status: Former Smoker    Packs/day: 0.50    Years: 10.00    Pack years: 5.00    Types: Cigarettes    Last attempt to quit: 10/26/1993    Years since quitting: 23.9  . Smokeless tobacco: Never Used  Substance and Sexual Activity  . Alcohol use: No    Comment:    . Drug use: No  . Sexual activity: Not on file  Other Topics Concern  . Not on file  Social History  Narrative   Divorced. Remarried --- pt , wife, wife's daughter     Current Outpatient Medications on File Prior to Visit  Medication Sig Dispense Refill  . aspirin EC 81 MG tablet Take 81 mg by mouth daily.    Marland Kitchen azelastine (ASTELIN) 0.1 % nasal spray Place 2 sprays into both nostrils at bedtime as needed for rhinitis or allergies. 90 mL 3  . B-D UF III MINI PEN NEEDLES 31G X 5 MM MISC USE AS DIRECTED TWICE DAILY 200 each 1  . losartan (COZAAR) 50 MG tablet Take 1 tablet (50 mg total) daily by mouth. 30 tablet 0  . OVER THE COUNTER MEDICATION Take 4 capsules by mouth daily. Nitric oxide supplement    . OVER THE COUNTER MEDICATION Take 1 application by mouth daily as needed. Pre workout vitamin supplement mix    . Protein POWD Take 1 scoop by mouth 2 (two) times daily.     No current facility-administered medications on file prior to visit.     No Known Allergies  Family History  Problem Relation Age of Onset  . Heart disease Mother        CABG-onset in her early 37's  . Diabetes Mother 58       had DM from her  69's  . Hypertension Mother   . Prostate cancer Father        dx in his 30s  . Stroke Neg Hx   . Colon cancer Neg Hx     BP 120/62 (BP Location: Left Arm, Patient Position: Sitting, Cuff Size: Normal)   Pulse 67   Wt 218 lb 6.4 oz (99.1 kg)   SpO2 94%   BMI 30.46 kg/m    Review of Systems He denies hypoglycemia.      Objective:   Physical Exam VITAL SIGNS:  See vs page GENERAL: no distress Pulses: foot pulses are intact bilaterally.   MSK: no deformity of the feet or ankles.  CV: no edema of the legs or ankles.  Skin:  no ulcer on the feet or ankles.  normal color and temp on the feet and ankles.  Neuro: sensation is intact to touch on the feet and ankles.    Lab Results  Component Value Date   HGBA1C 8.5 09/23/2017       Assessment & Plan:  Insulin-requiring type 2 DM, with renal failure: worse.  Occupational status: he needs symmetrical dosing.    Patient Instructions  check your blood sugar twice a day.  vary the time of day when you check, between before the 3 meals, and at bedtime.  also check if you have symptoms of your blood sugar being too high or too low.  please keep a record of the readings and bring it to your next appointment here (or you can bring the meter itself).  You can write it on any piece of paper.  please call us sooner if your blood sugar goes below 70, or if you have a lot of readings over 200. Please reduce the insulin to 45 units with breakfast, and 25 with supper.  However, you should take this as 45 if you are going to be awake, and 25 if you are going to sleep, no matter what the time of day. On this type of insulin schedule, you should eat meals on a regular schedule.  If a meal is missed or significantly delayed, your blood sugar could go low.   Please come back for a follow-up appointment in 2 months.

## 2017-09-23 NOTE — Patient Instructions (Addendum)
check your blood sugar twice a day.  vary the time of day when you check, between before the 3 meals, and at bedtime.  also check if you have symptoms of your blood sugar being too high or too low.  please keep a record of the readings and bring it to your next appointment here (or you can bring the meter itself).  You can write it on any piece of paper.  please call us sooner if your blood sugar goes below 70, or if you have a lot of readings over 200. Please reduce the insulin to 45 units with breakfast, and 25 with supper.  However, you should take this as 45 if you are going to be awake, and 25 if you are going to sleep, no matter what the time of day. On this type of insulin schedule, you should eat meals on a regular schedule.  If a meal is missed or significantly delayed, your blood sugar could go low.   Please come back for a follow-up appointment in 2 months.

## 2017-09-27 ENCOUNTER — Ambulatory Visit (INDEPENDENT_AMBULATORY_CARE_PROVIDER_SITE_OTHER): Payer: 59 | Admitting: Internal Medicine

## 2017-09-27 ENCOUNTER — Encounter: Payer: Self-pay | Admitting: Internal Medicine

## 2017-09-27 VITALS — BP 114/83 | HR 75 | Temp 97.8°F | Resp 16 | Ht 70.87 in | Wt 216.4 lb

## 2017-09-27 DIAGNOSIS — R945 Abnormal results of liver function studies: Secondary | ICD-10-CM

## 2017-09-27 DIAGNOSIS — Z0000 Encounter for general adult medical examination without abnormal findings: Secondary | ICD-10-CM

## 2017-09-27 DIAGNOSIS — R7989 Other specified abnormal findings of blood chemistry: Secondary | ICD-10-CM

## 2017-09-27 LAB — POC URINALSYSI DIPSTICK (AUTOMATED)
Bilirubin, UA: NEGATIVE
Blood, UA: NEGATIVE
Ketones, UA: NEGATIVE
Leukocytes, UA: NEGATIVE
Nitrite, UA: NEGATIVE
Spec Grav, UA: 1.02 (ref 1.010–1.025)
Urobilinogen, UA: 0.2 E.U./dL
pH, UA: 6 (ref 5.0–8.0)

## 2017-09-27 LAB — TSH: TSH: 1.4 u[IU]/mL (ref 0.35–4.50)

## 2017-09-27 LAB — PSA: PSA: 2.53 ng/mL (ref 0.10–4.00)

## 2017-09-27 NOTE — Patient Instructions (Signed)
GO TO THE LAB : Get the blood work     GO TO THE FRONT DESK Schedule your next appointment for a complete physical exam in 1 year, fasting   Follow with medication such as ibuprofen, naproxen, BCs (may damage your kidney).

## 2017-09-27 NOTE — Progress Notes (Signed)
Subjective:    Patient ID: Wayne Webb, male    DOB: 05-30-1966, 51 y.o.   MRN: [id number]  DOS:  09/27/2017 Type of visit - description : cpx Interval history: Notes from endocrinology reviewed In general feeling well, working night shifts.  Review of Systems Occasionally has itchy feeling in the chest, at rest, decreased by scratching or putting pressure there.  No exertional symptoms.  Other than above, a 14 point review of systems is negative     Past Medical History:  Diagnosis Date  . ALLERGIC RHINITIS 03/15/2007  . CRI (chronic renal insufficiency)    CRI, creat 1.4 , renal u/s 04-2011 neg  . DIABETES MELLITUS, TYPE II 03/15/2007  . Diplopia 12/24/2009  . ERECTILE DYSFUNCTION 03/15/2007  . GOITER, MULTINODULAR 03/21/2010  . HYPERLIPIDEMIA 03/15/2007    Past Surgical History:  Procedure Laterality Date  . LASIK  10-2012    Social History   Socioeconomic History  . Marital status: Married    Spouse name: Not on file  . Number of children: 1  . Years of education: Not on file  . Highest education level: Not on file  Social Needs  . Financial resource strain: Not on file  . Food insecurity - worry: Not on file  . Food insecurity - inability: Not on file  . Transportation needs - medical: Not on file  . Transportation needs - non-medical: Not on file  Occupational History  . Occupation: active job    Fish farm manager: [employer]    Comment: [employer]  Tobacco Use  . Smoking status: Former Smoker    Packs/day: 0.50    Years: 10.00    Pack years: 5.00    Types: Cigarettes    Last attempt to quit: 10/26/1993    Years since quitting: 23.9  . Smokeless tobacco: Never Used  Substance and Sexual Activity  . Alcohol use: No    Comment:    . Drug use: No  . Sexual activity: Not on file  Other Topics Concern  . Not on file  Social History Narrative   Divorced. Remarried : pt , wife      Family History  Problem Relation Age of Onset  . Heart disease  Mother        CABG-onset in her early 74's  . Diabetes Mother 27       had DM from her 1's  . Hypertension Mother   . Prostate cancer Father 70       dx in his 72s  . Stroke Neg Hx   . Colon cancer Neg Hx      Allergies as of 09/27/2017   No Known Allergies     Medication List        Accurate as of 09/27/17  1:01 PM. Always use your most recent med list.          aspirin EC 81 MG tablet Take 81 mg by mouth daily.   azelastine 0.1 % nasal spray Commonly known as:  ASTELIN Place 2 sprays into both nostrils at bedtime as needed for rhinitis or allergies.   B-D UF III MINI PEN NEEDLES 31G X 5 MM Misc Generic drug:  Insulin Pen Needle USE AS DIRECTED TWICE DAILY   Insulin Lispro Prot & Lispro (75-25) 100 UNIT/ML Kwikpen Commonly known as:  HUMALOG MIX 75/25 KWIKPEN 45 UNITS WITH BREAKFAST AND 25 UNITS WITH EVENING MEAL   losartan 50 MG tablet Commonly known as:  COZAAR Take 1 tablet (  50 mg total) daily by mouth.   OVER THE COUNTER MEDICATION Take 4 capsules by mouth daily. Nitric oxide supplement   OVER THE COUNTER MEDICATION Take 1 application by mouth daily as needed. Pre workout vitamin supplement mix   Protein Powd Take 1 scoop by mouth 2 (two) times daily.          Objective:   Physical Exam BP 114/83 (BP Location: Left Arm, Patient Position: Sitting, Cuff Size: Large)   Pulse 75   Temp 97.8 F (36.6 C) (Oral)   Resp 16   Ht 5' 10.87" (1.8 m)   Wt 216 lb 6.4 oz (98.2 kg)   SpO2 98%   BMI 30.30 kg/m   General:   Well developed, well nourished . NAD.  Neck: No  thyromegaly  HEENT:  Normocephalic . Face symmetric, atraumatic Lungs:  CTA B Normal respiratory effort, no intercostal retractions, no accessory muscle use. Heart: RRR,  no murmur.  No pretibial edema bilaterally  Abdomen:  Not distended, soft, non-tender. No rebound or rigidity.   Skin: Exposed areas without rash. Not pale. Not jaundice Rectal:  External abnormalities: none.  Normal sphincter tone. No rectal masses or tenderness.  No stools found Prostate: Prostate gland firm and smooth, no enlargement, nodularity, tenderness, mass, asymmetry or induration.  Neurologic:  alert & oriented X3.  Speech normal, gait appropriate for age and unassisted Strength symmetric and appropriate for age.  Psych: Cognition and judgment appear intact.  Cooperative with normal attention span and concentration.  Behavior appropriate. No anxious or depressed appearing.     Assessment & Plan:   Assessment DM - dr Loanne Drilling Hyperlipidemia Goiter -- per dr Loanne Drilling, last [place] 06/2016 stable CKD - renal US (-) 2012  ED Elevated LFTs Ultrasound 2015 negative,  hepatitis serologies (-) 01-2015: ferritin (-) previously slt elevated; wnl  transferrin saturation, ANA, ceruloplasmin, anti smooth muscles ab. Alpha 1 antitrypsin. Referred to GI Pulmonary: --[facility] --Chronic Cough, better after ACEi changed to Diovan 03-2015---Dr. Wert --Abnormal CXR   PLAN:  DM : Last A1c 8.5, per endocrinology Hyperlipidemia: Diet controlled, checking labs today, he did have a  snack few hours ago, consider recheck cholesterol on a fasting state. CKD: cont losartan, checking labs.  Avoid NSAIDs. Patient is somewhat concerned about the  itching at the chest, recommend observation. RTC 1 year

## 2017-09-27 NOTE — Assessment & Plan Note (Signed)
DM : Last A1c 8.5, per endocrinology Hyperlipidemia: Diet controlled, checking labs today, he did have a  snack few hours ago, consider recheck cholesterol on a fasting state. CKD: cont losartan, checking labs.  Avoid NSAIDs. Patient is somewhat concerned about the  itching at the chest, recommend observation. RTC 1 year

## 2017-09-27 NOTE — Assessment & Plan Note (Addendum)
-  Td 2016;  pneumonia shot 23: 2007;  prevnar : 2016; flu shot  @ his job - (+) FH prostate cancer , DRE  normal today, check a PSA - [facility]: Colonoscopy, 02/2014, 1 polyp, next 10 years - Has a very healthy lifestyle. -Labs: CMP, FLP, TSH, PSA (patient not fasting, consider recheck a fasting blood work)

## 2017-09-28 LAB — COMPREHENSIVE METABOLIC PANEL
ALT: 61 U/L — ABNORMAL HIGH (ref 0–53)
AST: 44 U/L — ABNORMAL HIGH (ref 0–37)
Albumin: 4.4 g/dL (ref 3.5–5.2)
Alkaline Phosphatase: 147 U/L — ABNORMAL HIGH (ref 39–117)
BUN: 34 mg/dL — ABNORMAL HIGH (ref 6–23)
CO2: 28 mEq/L (ref 19–32)
Calcium: 9.9 mg/dL (ref 8.4–10.5)
Chloride: 95 mEq/L — ABNORMAL LOW (ref 96–112)
Creatinine, Ser: 1.65 mg/dL — ABNORMAL HIGH (ref 0.40–1.50)
GFR: 56.27 mL/min — ABNORMAL LOW (ref >60.00)
Glucose, Bld: 396 mg/dL — ABNORMAL HIGH (ref 70–99)
Potassium: 4.4 mEq/L (ref 3.5–5.1)
Sodium: 133 mEq/L — ABNORMAL LOW (ref 135–145)
Total Bilirubin: 0.6 mg/dL (ref 0.2–1.2)
Total Protein: 8 g/dL (ref 6.0–8.3)

## 2017-09-28 LAB — LIPID PANEL
Cholesterol: 206 mg/dL — ABNORMAL HIGH (ref 0–200)
HDL: 38.3 mg/dL — ABNORMAL LOW (ref >39.00)
Total CHOL/HDL Ratio: 5
Triglycerides: 437 mg/dL — ABNORMAL HIGH (ref 0.0–149.0)

## 2017-09-28 LAB — LDL CHOLESTEROL, DIRECT: Direct LDL: 103 mg/dL

## 2017-10-01 NOTE — Addendum Note (Signed)
Addended byDamita Dunnings D on: 10/01/2017 08:27 AM   Modules accepted: Orders

## 2017-10-06 ENCOUNTER — Ambulatory Visit (INDEPENDENT_AMBULATORY_CARE_PROVIDER_SITE_OTHER): Payer: 59 | Admitting: Endocrinology

## 2017-10-06 ENCOUNTER — Encounter: Payer: Self-pay | Admitting: Endocrinology

## 2017-10-06 ENCOUNTER — Telehealth: Payer: Self-pay | Admitting: Endocrinology

## 2017-10-06 VITALS — BP 138/88 | HR 76 | Wt 222.2 lb

## 2017-10-06 DIAGNOSIS — Z794 Long term (current) use of insulin: Secondary | ICD-10-CM | POA: Diagnosis not present

## 2017-10-06 DIAGNOSIS — N183 Chronic kidney disease, stage 3 unspecified: Secondary | ICD-10-CM

## 2017-10-06 DIAGNOSIS — E1122 Type 2 diabetes mellitus with diabetic chronic kidney disease: Secondary | ICD-10-CM | POA: Diagnosis not present

## 2017-10-06 MED ORDER — DOXYCYCLINE HYCLATE 100 MG PO TABS: 100.0000 mg | ORAL_TABLET | Freq: Two times a day (BID) | ORAL | 0 refills | Status: DC

## 2017-10-06 MED ORDER — INSULIN DEGLUDEC 100 UNIT/ML ~~LOC~~ SOPN: 70.0000 [IU] | PEN_INJECTOR | Freq: Every day | SUBCUTANEOUS | 11 refills | Status: DC

## 2017-10-06 NOTE — Progress Notes (Signed)
Subjective:    Patient ID: Wayne Webb, male    DOB: [birth year], 51 y.o.   MRN: [id number]  HPI Pt returns for f/u of diabetes mellitus: DM type: Insulin-requiring type 2 Dx'ed: 6301 Complications: retinopathy and renal insuff.   Therapy: insulin since 2011.  DKA: never Severe hypoglycemia: never.  Pancreatitis: never Other: he declines multiple daily injections.  Interval history: no cbg record, but states cbg's vary from 135-210.  He says he never misses the insulin. He still works 3rd shift.  He wants to use up 72/25 before any new insulin.   Past Medical History:  Diagnosis Date  . ALLERGIC RHINITIS 03/15/2007  . CRI (chronic renal insufficiency)    CRI, creat 1.4 , renal u/s 04-2011 neg  . DIABETES MELLITUS, TYPE II 03/15/2007  . Diplopia 12/24/2009  . ERECTILE DYSFUNCTION 03/15/2007  . GOITER, MULTINODULAR 03/21/2010  . HYPERLIPIDEMIA 03/15/2007    Past Surgical History:  Procedure Laterality Date  . LASIK  10-2012    Social History   Socioeconomic History  . Marital status: Married    Spouse name: Not on file  . Number of children: 1  . Years of education: Not on file  . Highest education level: Not on file  Social Needs  . Financial resource strain: Not on file  . Food insecurity - worry: Not on file  . Food insecurity - inability: Not on file  . Transportation needs - medical: Not on file  . Transportation needs - non-medical: Not on file  Occupational History  . Occupation: active job    Fish farm manager: [employer]    Comment: [employer]  Tobacco Use  . Smoking status: Former Smoker    Packs/day: 0.50    Years: 10.00    Pack years: 5.00    Types: Cigarettes    Last attempt to quit: 10/26/1993    Years since quitting: 23.9  . Smokeless tobacco: Never Used  Substance and Sexual Activity  . Alcohol use: No    Comment:    . Drug use: No  . Sexual activity: Not on file  Other Topics Concern  . Not on file  Social History Narrative   Divorced.  Remarried : pt , wife     Current Outpatient Medications on File Prior to Visit  Medication Sig Dispense Refill  . aspirin EC 81 MG tablet Take 81 mg by mouth daily.    Marland Kitchen azelastine (ASTELIN) 0.1 % nasal spray Place 2 sprays into both nostrils at bedtime as needed for rhinitis or allergies. 90 mL 3  . B-D UF III MINI PEN NEEDLES 31G X 5 MM MISC USE AS DIRECTED TWICE DAILY 200 each 1  . losartan (COZAAR) 50 MG tablet Take 1 tablet (50 mg total) daily by mouth. 30 tablet 0  . OVER THE COUNTER MEDICATION Take 4 capsules by mouth daily. Nitric oxide supplement    . OVER THE COUNTER MEDICATION Take 1 application by mouth daily as needed. Pre workout vitamin supplement mix    . Protein POWD Take 1 scoop by mouth 2 (two) times daily.     No current facility-administered medications on file prior to visit.     No Known Allergies  Family History  Problem Relation Age of Onset  . Heart disease Mother        CABG-onset in her early 71's  . Diabetes Mother 55       had DM from her 10's  . Hypertension Mother   .  Prostate cancer Father 31       dx in his 30s  . Stroke Neg Hx   . Colon cancer Neg Hx     BP 138/88 (BP Location: Right Arm, Patient Position: Sitting, Cuff Size: Normal)   Pulse 76   Wt 222 lb 3.2 oz (100.8 kg)   SpO2 95%   BMI 31.11 kg/m    Review of Systems He has few days of slight purulent drainage from the paronychial area of the left great toenail, but no fever.      Objective:   Physical Exam VITAL SIGNS:  See vs page GENERAL: no distress.   Pulses: foot pulses are intact bilaterally.   MSK: no deformity of the feet or ankles.  CV: no edema of the legs or ankles.   Skin:  no ulcer on the feet or ankles.  normal color and temp on the feet and ankles Neuro: sensation is intact to touch on the feet and ankles.   Ext: minimal swelling at the left great toe paronychial area, but no drainage is seen  Lab Results  Component Value Date   HGBA1C 8.5 09/23/2017        Assessment & Plan:  Insulin-requiring type 2 DM, with renal failure: worse.  Occupational status: he needs a slower-release insulin.  Paronychial infection, new  Patient Instructions  check your blood sugar twice a day.  vary the time of day when you check, between before the 3 meals, and at bedtime.  also check if you have symptoms of your blood sugar being too high or too low.  please keep a record of the readings and bring it to your next appointment here (or you can bring the meter itself).  You can write it on any piece of paper.  please call us sooner if your blood sugar goes below 70, or if you have a lot of readings over 200. Please change the insulin to "Tresiba," 70 units daily.  On this type of insulin schedule, you should eat meals on a regular schedule.  If a meal is missed or significantly delayed, your blood sugar could go low.   I have sent a prescription to your pharmacy, for an antibiotic pill.  Please come back for a follow-up appointment in 3 months.

## 2017-10-06 NOTE — Patient Instructions (Addendum)
check your blood sugar twice a day.  vary the time of day when you check, between before the 3 meals, and at bedtime.  also check if you have symptoms of your blood sugar being too high or too low.  please keep a record of the readings and bring it to your next appointment here (or you can bring the meter itself).  You can write it on any piece of paper.  please call us sooner if your blood sugar goes below 70, or if you have a lot of readings over 200. Please change the insulin to "Tresiba," 70 units daily.  On this type of insulin schedule, you should eat meals on a regular schedule.  If a meal is missed or significantly delayed, your blood sugar could go low.   I have sent a prescription to your pharmacy, for an antibiotic pill.  Please come back for a follow-up appointment in 3 months.

## 2017-10-07 NOTE — Telephone Encounter (Signed)
error 

## 2017-10-14 ENCOUNTER — Other Ambulatory Visit: Payer: Self-pay | Admitting: Internal Medicine

## 2017-10-14 DIAGNOSIS — L03032 Cellulitis of left toe: Secondary | ICD-10-CM | POA: Diagnosis not present

## 2017-10-14 DIAGNOSIS — L600 Ingrowing nail: Secondary | ICD-10-CM | POA: Diagnosis not present

## 2017-11-09 ENCOUNTER — Encounter: Payer: Self-pay | Admitting: Gastroenterology

## 2017-11-09 ENCOUNTER — Ambulatory Visit (INDEPENDENT_AMBULATORY_CARE_PROVIDER_SITE_OTHER): Payer: 59 | Admitting: Gastroenterology

## 2017-11-09 ENCOUNTER — Other Ambulatory Visit (INDEPENDENT_AMBULATORY_CARE_PROVIDER_SITE_OTHER): Payer: 59

## 2017-11-09 VITALS — BP 110/70 | HR 76 | Ht 71.0 in | Wt 216.0 lb

## 2017-11-09 DIAGNOSIS — N183 Chronic kidney disease, stage 3 unspecified: Secondary | ICD-10-CM

## 2017-11-09 DIAGNOSIS — E1122 Type 2 diabetes mellitus with diabetic chronic kidney disease: Secondary | ICD-10-CM

## 2017-11-09 DIAGNOSIS — R7989 Other specified abnormal findings of blood chemistry: Secondary | ICD-10-CM

## 2017-11-09 DIAGNOSIS — R945 Abnormal results of liver function studies: Secondary | ICD-10-CM | POA: Diagnosis not present

## 2017-11-09 DIAGNOSIS — Z794 Long term (current) use of insulin: Secondary | ICD-10-CM | POA: Diagnosis not present

## 2017-11-09 DIAGNOSIS — L600 Ingrowing nail: Secondary | ICD-10-CM | POA: Diagnosis not present

## 2017-11-09 LAB — IBC PANEL
Iron: 82 ug/dL (ref 42–165)
Saturation Ratios: 20.3 % (ref 20.0–50.0)
Transferrin: 288 mg/dL (ref 212.0–360.0)

## 2017-11-09 LAB — FERRITIN: Ferritin: 578.2 ng/mL — ABNORMAL HIGH (ref 22.0–322.0)

## 2017-11-09 NOTE — Progress Notes (Signed)
[facility] Consult Note:  History: Wayne Webb 11/09/2017  Referring physician: Colon Branch, MD  Reason for consult/chief complaint: elevated lfts (pt reports no GI symptoms at this time)   Subjective  HPI:  This is a 52 year old man referred by primary care noted above for abnormal LFTs.  It appears to have been going on at least since 2014, some results of prior workup are noted below.  At his most recent primary care appointment, it was felt that he should  have reevaluation of this.  He is generally feeling well, with no chronic abdominal pain, altered bowel habits or rectal bleeding.  He rarely drinks alcohol, has no history of viral hepatitis and no known family history of liver disease.  He uses NSAIDs rarely, and even stopped those after recent primary care appointment. Thelbert was diagnosed with diabetes about 2006, and has been on insulin for at least the last several years.  It sounds as if there is been intermittent difficulty with glucose spikes including as recently as last month.  His hemoglobin A1c had previously been under 7, and on the most recent check in November it was 8.5. Review of most recent primary care notes and endocrinology notes indicate that his diabetes has caused chronic kidney disease.  ROS:  Review of Systems  Constitutional: Negative for appetite change and unexpected weight change.  HENT: Negative for mouth sores and voice change.   Eyes: Negative for pain and redness.  Respiratory: Negative for cough and shortness of breath.   Cardiovascular: Negative for chest pain and palpitations.  Genitourinary: Negative for dysuria and hematuria.  Musculoskeletal: Negative for arthralgias and myalgias.  Skin: Negative for pallor and rash.  Neurological: Negative for weakness and headaches.  Hematological: Negative for adenopathy.     Past Medical History: Past Medical History:  Diagnosis Date  . ALLERGIC RHINITIS 03/15/2007  .  CRI (chronic renal insufficiency)    CRI, creat 1.4 , renal u/s 04-2011 neg  . DIABETES MELLITUS, TYPE II 03/15/2007  . Diplopia 12/24/2009  . ERECTILE DYSFUNCTION 03/15/2007  . GOITER, MULTINODULAR 03/21/2010  . HYPERLIPIDEMIA 03/15/2007   Colonoscopy by Dr. Deatra Ina in May 2015 revealed no adenomatous polyps, advised to have a recall in 10 years.  Past Surgical History: Past Surgical History:  Procedure Laterality Date  . LASIK  10-2012     Family History: Family History  Problem Relation Age of Onset  . Heart disease Mother        CABG-onset in her early 44's  . Diabetes Mother 69       had DM from her 65's  . Hypertension Mother   . Prostate cancer Father 57       dx in his 29s  . Stroke Neg Hx   . Colon cancer Neg Hx     Social History: Social History   Socioeconomic History  . Marital status: Married    Spouse name: None  . Number of children: 1  . Years of education: None  . Highest education level: None  Social Needs  . Financial resource strain: None  . Food insecurity - worry: None  . Food insecurity - inability: None  . Transportation needs - medical: None  . Transportation needs - non-medical: None  Occupational History  . Occupation: active job    Fish farm manager: [employer]    Comment: [employer]  Tobacco Use  . Smoking status: Former Smoker    Packs/day: 0.50    Years: 10.00  Pack years: 5.00    Types: Cigarettes    Last attempt to quit: 10/26/1993    Years since quitting: 24.0  . Smokeless tobacco: Never Used  Substance and Sexual Activity  . Alcohol use: No    Comment:    . Drug use: No  . Sexual activity: None  Other Topics Concern  . None  Social History Narrative   Divorced. Remarried : pt , wife     Allergies: No Known Allergies  Outpatient Meds: Current Outpatient Medications  Medication Sig Dispense Refill  . aspirin EC 81 MG tablet Take 81 mg by mouth daily.    Marland Kitchen azelastine (ASTELIN) 0.1 % nasal spray Place 2 sprays into  both nostrils at bedtime as needed for rhinitis or allergies. 90 mL 3  . B-D UF III MINI PEN NEEDLES 31G X 5 MM MISC USE AS DIRECTED TWICE DAILY 200 each 1  . insulin degludec (TRESIBA FLEXTOUCH) 100 UNIT/ML SOPN FlexTouch Pen Inject 0.7 mLs (70 Units total) into the skin daily. And pen needles 1/day 25 pen 11  . losartan (COZAAR) 50 MG tablet Take 1 tablet (50 mg total) by mouth daily. 90 tablet 2  . OVER THE COUNTER MEDICATION Take 4 capsules by mouth daily. Nitric oxide supplement    . OVER THE COUNTER MEDICATION Take 1 application by mouth daily as needed. Pre workout vitamin supplement mix    . Protein POWD Take 1 scoop by mouth 2 (two) times daily.     No current facility-administered medications for this visit.       ___________________________________________________________________ Objective   Exam:  BP 110/70   Pulse 76   Ht 5\' 11"  (1.803 m)   Wt 216 lb (98 kg)   BMI 30.13 kg/m    General: this is a(n) well-appearing middle-aged man, modestly overweight with a large build.  Eyes: sclera anicteric, no redness  ENT: oral mucosa moist without lesions, no cervical or supraclavicular lymphadenopathy, good dentition  CV: RRR without murmur, S1/S2, no JVD, no peripheral edema  Resp: clear to auscultation bilaterally, normal RR and effort noted  GI: soft, no tenderness, with active bowel sounds. No guarding or palpable organomegaly noted.  Skin; warm and dry, no rash or jaundice noted  Neuro: awake, alert and oriented x 3. Normal gross motor function and fluent speech  Labs:  CMP Latest Ref Rng & Units 09/27/2017 10/31/2016 08/28/2016  Glucose 70 - 99 mg/dL 396(H) 566(HH) 173(H)  BUN 6 - 23 mg/dL 34(H) 36(H) 42(H)  Creatinine 0.40 - 1.50 mg/dL 1.65(H) 1.75(H) 1.57(H)  Sodium 135 - 145 mEq/L 133(L) 130(L) 136  Potassium 3.5 - 5.1 mEq/L 4.4 4.8 3.9  Chloride 96 - 112 mEq/L 95(L) 96(L) 101  CO2 19 - 32 mEq/L 28 23 27   Calcium 8.4 - 10.5 mg/dL 9.9 9.7 10.3  Total  Protein 6.0 - 8.3 g/dL 8.0 8.3(H) -  Total Bilirubin 0.2 - 1.2 mg/dL 0.6 0.8 -  Alkaline Phos 39 - 117 U/L 147(H) 160(H) -  AST 0 - 37 U/L 44(H) 42(H) 63(H)  ALT 0 - 53 U/L 61(H) 62 74(H)  t bili 0.6  Neg HBC and HCV labs 11/2012 HGB A1C 8.5 on 09/23/17 AST 50, ALT 56  In 11/2012 TSH 1.4 Iron studies elevated in 2014 (only 40% Sat)  Radiologic Studies:  [place] (for LFTs) 12/2013 - normal   Assessment: Encounter Diagnoses  Name Primary?  Marland Kitchen LFTs abnormal Yes  . Type 2 diabetes mellitus with stage 3 chronic kidney disease,  with long-term current use of insulin ([place])     He most likely has nonalcoholic fatty liver disease, and I suspect that was the case going back even to his initial workup 5 years ago.  It may have been that the parenchymal change visible on ultrasound was not apparent at that point.  Less likely considerations are hemochromatosis or autoimmune liver disease.  The pattern of his LFTs has been very similar overall these years except for a modest rise in the alkaline phosphatase at this point.  Plan:  Labs for autoimmune hepatitis as well as iron studies.  He previously had elevated iron saturation the does not meet criteria for hemochromatosis.  That was probably secondary iron overload related to probable fatty liver.  Nevertheless, we will recheck at this point to see if he may need genetic testing.  Right upper quadrant ultrasound to assess hepatic parenchyma.  Assuming that we would find a fatty liver, I discussed with him the need for long-term glucose control and modest weight loss, that should certainly be under the careful direction of his endocrinologist since his diabetes has been somewhat labile.  He should not embark on a severe carbohydrate reduced diet without the advice of his endocrinologist since he could risk hypoglycemic episodes without appropriate adjustment of his insulin regimen.    We also discussed how fatty liver can potentially progress to  cirrhosis.  Thank you for the courtesy of this consult.  Please call me with any questions or concerns.  Nelida Meuse III  CC: Colon Branch, MD  Renato Shin, MD

## 2017-11-09 NOTE — Patient Instructions (Signed)
If you are age 54 or older, your body mass index should be between 23-30. Your Body mass index is 30.13 kg/m. If this is out of the aforementioned range listed, please consider follow up with your Primary Care Provider.  If you are age 25 or younger, your body mass index should be between 19-25. Your Body mass index is 30.13 kg/m. If this is out of the aformentioned range listed, please consider follow up with your Primary Care Provider.   You have been scheduled for an abdominal ultrasound at [facility] (1st floor of hospital) on 11-15-2017 at 930am. Please arrive 15 minutes prior to your appointment for registration. Make certain not to have anything to eat or drink 6 hours prior to your appointment. Should you need to reschedule your appointment, please contact radiology at [phone]. This test typically takes about 30 minutes to perform.  Your physician has requested that you go to the basement for lab work before leaving today.  Thank you for choosing [facility]  Dr Wilfrid Lund III

## 2017-11-11 LAB — ANA: Anti Nuclear Antibody(ANA): POSITIVE — AB

## 2017-11-11 LAB — ANTI-NUCLEAR AB-TITER (ANA TITER): ANA Titer 1: 1:80 {titer} — ABNORMAL HIGH

## 2017-11-11 LAB — MITOCHONDRIAL ANTIBODIES: Mitochondrial M2 Ab, IgG: 20.6 U — ABNORMAL HIGH

## 2017-11-11 LAB — ANTI-SMOOTH MUSCLE ANTIBODY, IGG: Actin (Smooth Muscle) Antibody (IGG): 23 U — ABNORMAL HIGH (ref <20)

## 2017-11-15 ENCOUNTER — Ambulatory Visit (HOSPITAL_COMMUNITY)
Admission: RE | Admit: 2017-11-15 | Discharge: 2017-11-15 | Disposition: A | Discharge status: Home / Self Care | Payer: 59 | Source: Ambulatory Visit | Attending: Gastroenterology | Admitting: Gastroenterology

## 2017-11-15 DIAGNOSIS — E1122 Type 2 diabetes mellitus with diabetic chronic kidney disease: Secondary | ICD-10-CM | POA: Insufficient documentation

## 2017-11-15 DIAGNOSIS — R945 Abnormal results of liver function studies: Secondary | ICD-10-CM | POA: Insufficient documentation

## 2017-11-15 DIAGNOSIS — N183 Chronic kidney disease, stage 3 unspecified: Secondary | ICD-10-CM

## 2017-11-15 DIAGNOSIS — Z794 Long term (current) use of insulin: Secondary | ICD-10-CM | POA: Insufficient documentation

## 2017-11-15 DIAGNOSIS — R7989 Other specified abnormal findings of blood chemistry: Secondary | ICD-10-CM

## 2017-11-23 ENCOUNTER — Encounter: Payer: Self-pay | Admitting: Endocrinology

## 2017-11-23 ENCOUNTER — Ambulatory Visit (INDEPENDENT_AMBULATORY_CARE_PROVIDER_SITE_OTHER): Payer: 59 | Admitting: Endocrinology

## 2017-11-23 VITALS — BP 122/86 | HR 73 | Wt 220.4 lb

## 2017-11-23 DIAGNOSIS — Z794 Long term (current) use of insulin: Secondary | ICD-10-CM | POA: Diagnosis not present

## 2017-11-23 DIAGNOSIS — E1122 Type 2 diabetes mellitus with diabetic chronic kidney disease: Secondary | ICD-10-CM | POA: Diagnosis not present

## 2017-11-23 DIAGNOSIS — N183 Chronic kidney disease, stage 3 unspecified: Secondary | ICD-10-CM

## 2017-11-23 LAB — POCT GLYCOSYLATED HEMOGLOBIN (HGB A1C): Hemoglobin A1C: 12.2

## 2017-11-23 MED ORDER — BASAGLAR KWIKPEN 100 UNIT/ML ~~LOC~~ SOPN: 70.0000 [IU] | PEN_INJECTOR | Freq: Every day | SUBCUTANEOUS | 3 refills | Status: DC

## 2017-11-23 NOTE — Progress Notes (Signed)
Subjective:    Patient ID: Wayne Webb, male    DOB: [birth year], 52 y.o.   MRN: [id number]  HPI Pt returns for f/u of diabetes mellitus: DM type: Insulin-requiring type 2 Dx'ed: 5993 Complications: retinopathy and renal insuff.   Therapy: insulin since 2011.  DKA: never Severe hypoglycemia: never.  Pancreatitis: never Other: he declines multiple daily injections; he works 3rd shift Interval history: pt says ins declined tresiba.  no cbg record, but states cbg's are in the 200's and 300's.  He says he never misses the insulin.   Past Medical History:  Diagnosis Date  . ALLERGIC RHINITIS 03/15/2007  . CRI (chronic renal insufficiency)    CRI, creat 1.4 , renal u/s 04-2011 neg  . DIABETES MELLITUS, TYPE II 03/15/2007  . Diplopia 12/24/2009  . ERECTILE DYSFUNCTION 03/15/2007  . GOITER, MULTINODULAR 03/21/2010  . HYPERLIPIDEMIA 03/15/2007    Past Surgical History:  Procedure Laterality Date  . LASIK  10-2012    Social History   Socioeconomic History  . Marital status: Married    Spouse name: Not on file  . Number of children: 1  . Years of education: Not on file  . Highest education level: Not on file  Social Needs  . Financial resource strain: Not on file  . Food insecurity - worry: Not on file  . Food insecurity - inability: Not on file  . Transportation needs - medical: Not on file  . Transportation needs - non-medical: Not on file  Occupational History  . Occupation: active job    Fish farm manager: [employer]    Comment: [employer]  Tobacco Use  . Smoking status: Former Smoker    Packs/day: 0.50    Years: 10.00    Pack years: 5.00    Types: Cigarettes    Last attempt to quit: 10/26/1993    Years since quitting: 24.0  . Smokeless tobacco: Never Used  Substance and Sexual Activity  . Alcohol use: No    Comment:    . Drug use: No  . Sexual activity: Not on file  Other Topics Concern  . Not on file  Social History Narrative   Divorced. Remarried : pt ,  wife     Current Outpatient Medications on File Prior to Visit  Medication Sig Dispense Refill  . aspirin EC 81 MG tablet Take 81 mg by mouth daily.    Marland Kitchen azelastine (ASTELIN) 0.1 % nasal spray Place 2 sprays into both nostrils at bedtime as needed for rhinitis or allergies. 90 mL 3  . B-D UF III MINI PEN NEEDLES 31G X 5 MM MISC USE AS DIRECTED TWICE DAILY 200 each 1  . losartan (COZAAR) 50 MG tablet Take 1 tablet (50 mg total) by mouth daily. 90 tablet 2  . OVER THE COUNTER MEDICATION Take 4 capsules by mouth daily. Nitric oxide supplement    . OVER THE COUNTER MEDICATION Take 1 application by mouth daily as needed. Pre workout vitamin supplement mix    . Protein POWD Take 1 scoop by mouth 2 (two) times daily.     No current facility-administered medications on file prior to visit.     No Known Allergies  Family History  Problem Relation Age of Onset  . Heart disease Mother        CABG-onset in her early 9's  . Diabetes Mother 32       had DM from her 25's  . Hypertension Mother   . Prostate cancer Father 54  dx in his 60s  . Stroke Neg Hx   . Colon cancer Neg Hx     BP 122/86 (BP Location: Left Arm, Patient Position: Sitting, Cuff Size: Normal)   Pulse 73   Wt 220 lb 6.4 oz (100 kg)   SpO2 94%   BMI 30.74 kg/m    Review of Systems He denies hypoglycemia.     Objective:   Physical Exam VITAL SIGNS:  See vs page GENERAL: no distress.   Pulses: foot pulses are intact bilaterally.   MSK: no deformity of the feet or ankles.  CV: no edema of the legs or ankles.   Skin:  no ulcer on the feet or ankles.  normal color and temp on the feet and ankles Neuro: sensation is intact to touch on the feet and ankles.     Lab Results  Component Value Date   HGBA1C 12.2 11/23/2017      Assessment & Plan:  Insulin-requiring type 2 DM, with renal insuff: worse: he needs a simpler insulin schedule.  Occupational status (shift work): he basal QD insulin.     Patient  Instructions  check your blood sugar twice a day.  vary the time of day when you check, between before the 3 meals, and at bedtime.  also check if you have symptoms of your blood sugar being too high or too low.  please keep a record of the readings and bring it to your next appointment here (or you can bring the meter itself).  You can write it on any piece of paper.  please call us sooner if your blood sugar goes below 70, or if you have a lot of readings over 200. Please change the insulin to "basaglar," 70 units daily.  On this type of insulin schedule, you should eat meals on a regular schedule.  If a meal is missed or significantly delayed, your blood sugar could go low.   Please come back for a follow-up appointment in 2 months.

## 2017-11-23 NOTE — Patient Instructions (Addendum)
check your blood sugar twice a day.  vary the time of day when you check, between before the 3 meals, and at bedtime.  also check if you have symptoms of your blood sugar being too high or too low.  please keep a record of the readings and bring it to your next appointment here (or you can bring the meter itself).  You can write it on any piece of paper.  please call us sooner if your blood sugar goes below 70, or if you have a lot of readings over 200. Please change the insulin to "basaglar," 70 units daily.  On this type of insulin schedule, you should eat meals on a regular schedule.  If a meal is missed or significantly delayed, your blood sugar could go low.   Please come back for a follow-up appointment in 2 months.

## 2017-11-24 ENCOUNTER — Telehealth: Payer: Self-pay | Admitting: Endocrinology

## 2017-11-24 NOTE — Telephone Encounter (Signed)
Patient stated that [facility] mail service need varications for patient medication Basaglar  [place], [place] [phone] (Phone) [phone] (Fax)

## 2017-11-25 ENCOUNTER — Other Ambulatory Visit: Payer: Self-pay

## 2017-11-25 MED ORDER — INSULIN PEN NEEDLE 31G X 5 MM MISC: 1 refills | Status: DC

## 2017-11-25 NOTE — Telephone Encounter (Signed)
I called [facility] & clarified script. I have sent over separate prescription for pen needles.

## 2017-11-26 ENCOUNTER — Other Ambulatory Visit: Payer: Self-pay

## 2017-11-26 NOTE — Telephone Encounter (Signed)
Patient stated that Wayne Webb switched his insulin, he went to the pharmacy and they told him a different way to take his medication. Please advise

## 2017-11-26 NOTE — Telephone Encounter (Signed)
I have called & clarified insulin with patient.

## 2017-12-03 ENCOUNTER — Other Ambulatory Visit: Payer: Self-pay

## 2017-12-03 MED ORDER — INSULIN PEN NEEDLE 31G X 5 MM MISC: 1 refills | Status: DC

## 2017-12-07 ENCOUNTER — Other Ambulatory Visit: Payer: Self-pay

## 2017-12-07 NOTE — Telephone Encounter (Signed)
Patient asl when do he take his medication Basaglar morning or evening. Please advise

## 2017-12-07 NOTE — Telephone Encounter (Signed)
LVM with directions & asked for a call back if he had any further questions.

## 2017-12-07 NOTE — Telephone Encounter (Signed)
Morning is better.

## 2018-01-20 ENCOUNTER — Ambulatory Visit: Payer: 59 | Admitting: Endocrinology

## 2018-02-22 LAB — HM DIABETES EYE EXAM

## 2018-06-16 ENCOUNTER — Encounter: Payer: Self-pay | Admitting: Internal Medicine

## 2018-06-17 ENCOUNTER — Encounter: Payer: Self-pay | Admitting: Internal Medicine

## 2018-06-20 ENCOUNTER — Ambulatory Visit (INDEPENDENT_AMBULATORY_CARE_PROVIDER_SITE_OTHER): Payer: 59 | Admitting: Internal Medicine

## 2018-06-20 ENCOUNTER — Other Ambulatory Visit: Payer: Self-pay

## 2018-06-20 ENCOUNTER — Encounter: Payer: Self-pay | Admitting: Internal Medicine

## 2018-06-20 VITALS — BP 116/76 | HR 74 | Temp 97.8°F | Resp 16 | Ht 72.0 in | Wt 210.0 lb

## 2018-06-20 DIAGNOSIS — R109 Unspecified abdominal pain: Secondary | ICD-10-CM

## 2018-06-20 DIAGNOSIS — N189 Chronic kidney disease, unspecified: Secondary | ICD-10-CM | POA: Diagnosis not present

## 2018-06-20 DIAGNOSIS — R945 Abnormal results of liver function studies: Secondary | ICD-10-CM

## 2018-06-20 DIAGNOSIS — R7989 Other specified abnormal findings of blood chemistry: Secondary | ICD-10-CM

## 2018-06-20 LAB — BASIC METABOLIC PANEL
BUN: 45 mg/dL — ABNORMAL HIGH (ref 6–23)
CO2: 30 mEq/L (ref 19–32)
Calcium: 10.8 mg/dL — ABNORMAL HIGH (ref 8.4–10.5)
Chloride: 99 mEq/L (ref 96–112)
Creatinine, Ser: 1.73 mg/dL — ABNORMAL HIGH (ref 0.40–1.50)
GFR: 53.13 mL/min — ABNORMAL LOW (ref >60.00)
Glucose, Bld: 183 mg/dL — ABNORMAL HIGH (ref 70–99)
Potassium: 4 mEq/L (ref 3.5–5.1)
Sodium: 136 mEq/L (ref 135–145)

## 2018-06-20 NOTE — Assessment & Plan Note (Signed)
Left-sided abdominal pain: Sxs are essentially resolved, he also saw a asymmetry of the abdominal wall, exam today is normal.  For now recommend observation DM: Uncontrolled, strongly encouraged to see endocrinology.  Currently on Basaglar 70 units, ambulatory CBGs range from 80-300. CKD: Checking a BMP Elevated LFTs:Saw GI 10/2017: U/S wnl, labs borderline abnormal was RX a live Bx.  Has not proceed with a biopsy. RTC 09/2018, CPX

## 2018-06-20 NOTE — Progress Notes (Signed)
Subjective:    Patient ID: Wayne Webb, male    DOB: 04/01/1966, 52 y.o.   MRN: [id number]  DOS:  06/20/2018 Type of visit - description : acute Interval history: Here because last week pain at the left side of the abdomen, on and off, no change with eating, not described as superficial achiness.  Overall the pain is essentially gone. At the same time, when he looked at the abdominal wall, he felt it was asymmetric and larger on the left side. He is insulin-dependent and injects insulin and different areas of his abdomen.  CKD: Due for a BMP  DM: On insulin 70 units daily.   Review of Systems  Denies fever chills No LUTS No nausea, vomiting, diarrhea. No flank pain per se  Past Medical History:  Diagnosis Date  . ALLERGIC RHINITIS 03/15/2007  . CRI (chronic renal insufficiency)    CRI, creat 1.4 , renal u/s 04-2011 neg  . DIABETES MELLITUS, TYPE II 03/15/2007  . Diplopia 12/24/2009  . ERECTILE DYSFUNCTION 03/15/2007  . GOITER, MULTINODULAR 03/21/2010  . HYPERLIPIDEMIA 03/15/2007    Past Surgical History:  Procedure Laterality Date  . LASIK  10-2012    Social History   Socioeconomic History  . Marital status: Married    Spouse name: Not on file  . Number of children: 1  . Years of education: Not on file  . Highest education level: Not on file  Occupational History  . Occupation: active job    Fish farm manager: [employer]    Comment: [employer]  Social Needs  . Financial resource strain: Not on file  . Food insecurity:    Worry: Not on file    Inability: Not on file  . Transportation needs:    Medical: Not on file    Non-medical: Not on file  Tobacco Use  . Smoking status: Former Smoker    Packs/day: 0.50    Years: 10.00    Pack years: 5.00    Types: Cigarettes    Last attempt to quit: 10/26/1993    Years since quitting: 24.6  . Smokeless tobacco: Never Used  Substance and Sexual Activity  . Alcohol use: No    Comment:    . Drug use: No  . Sexual  activity: Not on file  Lifestyle  . Physical activity:    Days per week: Not on file    Minutes per session: Not on file  . Stress: Not on file  Relationships  . Social connections:    Talks on phone: Not on file    Gets together: Not on file    Attends religious service: Not on file    Active member of club or organization: Not on file    Attends meetings of clubs or organizations: Not on file    Relationship status: Not on file  . Intimate partner violence:    Fear of current or ex partner: Not on file    Emotionally abused: Not on file    Physically abused: Not on file    Forced sexual activity: Not on file  Other Topics Concern  . Not on file  Social History Narrative   Divorced. Remarried : pt , wife       Allergies as of 06/20/2018   No Known Allergies     Medication List        Accurate as of 06/20/18 10:03 AM. Always use your most recent med list.  aspirin EC 81 MG tablet Take 81 mg by mouth daily.   azelastine 0.1 % nasal spray Commonly known as:  ASTELIN Place 2 sprays into both nostrils at bedtime as needed for rhinitis or allergies.   BASAGLAR KWIKPEN 100 UNIT/ML Sopn Inject 0.7 mLs (70 Units total) into the skin daily. And pen needles 1/day   Insulin Pen Needle 31G X 5 MM Misc USE AS DIRECTED once DAILY   losartan 50 MG tablet Commonly known as:  COZAAR Take 1 tablet (50 mg total) by mouth daily.   OVER THE COUNTER MEDICATION Take 4 capsules by mouth daily. Nitric oxide supplement   OVER THE COUNTER MEDICATION Take 1 application by mouth daily as needed. Pre workout vitamin supplement mix   Protein Powd Take 1 scoop by mouth 2 (two) times daily.          Objective:   Physical Exam BP 116/76 (BP Location: Left Arm, Patient Position: Sitting, Cuff Size: Large)   Pulse 74   Temp 97.8 F (36.6 C)   Resp 16   Ht 6' (1.829 m)   Wt 210 lb (95.3 kg)   SpO2 98%   BMI 28.48 kg/m  General:   Well developed, NAD, see BMI.    HEENT:  Normocephalic . Face symmetric, atraumatic Lungs:  CTA B Normal respiratory effort, no intercostal retractions, no accessory muscle use. Heart: RRR,  no murmur.  no pretibial edema bilaterally  Abdomen:  Not distended, soft, non-tender. No rebound or rigidity.  No organomegaly to deep palpation and percussion.  Abdominal wall symmetric, no hernias. Skin: Not pale. Not jaundice Neurologic:  alert & oriented X3.  Speech normal, gait appropriate for age and unassisted Psych--  Cognition and judgment appear intact.  Cooperative with normal attention span and concentration.  Behavior appropriate. No anxious or depressed appearing.     Assessment & Plan:   Assessment DM - dr Loanne Drilling Hyperlipidemia (h/o increased LFTs w/ simva 2013) Goiter -- per dr Loanne Drilling, last [place] 06/2016 stable CKD - renal US (-) 2012  ED Elevated LFTs Ultrasound 2015 negative,  hepatitis serologies (-) 01-2015: ferritin (-) previously slt elevated; wnl  transferrin saturation, ANA, ceruloplasmin, anti smooth muscles ab. Alpha 1 antitrypsin. Saw GI 10/2017: U/S wnl, labs borderline abnormal was RX a live Bx Pulmonary: --[facility] --Chronic Cough, better after ACEi changed to Diovan 03-2015---Dr. Wert --Abnormal CXR   PLAN:  Left-sided abdominal pain: Sxs are essentially resolved, he also saw a asymmetry of the abdominal wall, exam today is normal.  For now recommend observation DM: Uncontrolled, strongly encouraged to see endocrinology.  Currently on Basaglar 70 units, ambulatory CBGs range from 80-300. CKD: Checking a BMP Elevated LFTs:Saw GI 10/2017: U/S wnl, labs borderline abnormal was RX a live Bx.  Has not proceed with a biopsy. RTC 09/2018, CPX

## 2018-06-20 NOTE — Patient Instructions (Signed)
GO TO THE LAB : Get the blood work     GO TO THE FRONT DESK Schedule your next appointment for a  Physical December 2019  Please see your endocrinologist ASAP

## 2018-06-23 ENCOUNTER — Encounter: Payer: Self-pay | Admitting: Endocrinology

## 2018-06-23 ENCOUNTER — Ambulatory Visit (INDEPENDENT_AMBULATORY_CARE_PROVIDER_SITE_OTHER): Payer: 59 | Admitting: Endocrinology

## 2018-06-23 VITALS — BP 134/86 | HR 65 | Ht 72.0 in | Wt 213.0 lb

## 2018-06-23 DIAGNOSIS — N183 Chronic kidney disease, stage 3 unspecified: Secondary | ICD-10-CM

## 2018-06-23 DIAGNOSIS — Z794 Long term (current) use of insulin: Secondary | ICD-10-CM | POA: Diagnosis not present

## 2018-06-23 DIAGNOSIS — E1122 Type 2 diabetes mellitus with diabetic chronic kidney disease: Secondary | ICD-10-CM | POA: Diagnosis not present

## 2018-06-23 LAB — POCT GLYCOSYLATED HEMOGLOBIN (HGB A1C): Hemoglobin A1C: 8.1 % — AB (ref 4.0–5.6)

## 2018-06-23 MED ORDER — INSULIN DEGLUDEC 200 UNIT/ML ~~LOC~~ SOPN: 70.0000 [IU] | PEN_INJECTOR | Freq: Every day | SUBCUTANEOUS | 3 refills | Status: DC

## 2018-06-23 NOTE — Progress Notes (Signed)
Subjective:    Patient ID: Wayne Webb, male    DOB: 1966-01-20, 52 y.o.   MRN: [id number]  HPI Pt returns for f/u of diabetes mellitus: DM type: Insulin-requiring type 2 Dx'ed: 0630 Complications: retinopathy and renal insuff.   Therapy: insulin since 2011.  DKA: never Severe hypoglycemia: never.  Pancreatitis: never Other: he declines multiple daily injections; he works 3rd shift Interval history: pt says ins declined tresiba.  no cbg record, but states cbg's vary from 67-300.  He says he never misses the insulin.   Past Medical History:  Diagnosis Date  . ALLERGIC RHINITIS 03/15/2007  . CRI (chronic renal insufficiency)    CRI, creat 1.4 , renal u/s 04-2011 neg  . DIABETES MELLITUS, TYPE II 03/15/2007  . Diplopia 12/24/2009  . ERECTILE DYSFUNCTION 03/15/2007  . GOITER, MULTINODULAR 03/21/2010  . HYPERLIPIDEMIA 03/15/2007    Past Surgical History:  Procedure Laterality Date  . LASIK  10-2012    Social History   Socioeconomic History  . Marital status: Married    Spouse name: Not on file  . Number of children: 1  . Years of education: Not on file  . Highest education level: Not on file  Occupational History  . 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 Financial resource strain: Not on file  . Food insecurity:    Worry: Not on file    Inability: Not on file  . Transportation needs:    Medical: Not on file    Non-medical: Not on file  Tobacco Use  . Smoking status: Former Smoker    Packs/day: 0.50    Years: 10.00    Pack years: 5.00    Types: Cigarettes    Last attempt to quit: 10/26/1993    Years since quitting: 24.6  . Smokeless tobacco: Never Used  Substance and Sexual Activity  . Alcohol use: No    Comment:    . Drug use: No  . Sexual activity: Not on file  Lifestyle  . Physical activity:    Days per week: Not on file    Minutes per session: Not on file  . Stress: Not on file  Relationships    . Social connections:    Talks on phone: Not on file    Gets together: Not on file    Attends religious service: Not on file    Active member of club or organization: Not on file    Attends meetings of clubs or organizations: Not on file    Relationship status: Not on file  . Intimate partner violence:    Fear of current or ex partner: Not on file    Emotionally abused: Not on file    Physically abused: Not on file    Forced sexual activity: Not on file  Other Topics Concern  . Not on file  Social History Narrative   Divorced. Remarried : pt , wife     Current Outpatient Medications on File Prior to Visit  Medication Sig Dispense Refill  . aspirin EC 81 MG tablet Take 81 mg by mouth daily.    Marland Kitchen azelastine (ASTELIN) 0.1 % nasal spray Place 2 sprays into both nostrils at bedtime as needed for rhinitis or allergies. 90 mL 3  . losartan (COZAAR) 50 MG tablet Take 1 tablet (50 mg total) by mouth daily. 90 tablet 2  . OVER THE COUNTER MEDICATION Take 4 capsules by mouth  daily. Nitric oxide supplement    . OVER THE COUNTER MEDICATION Take 1 application by mouth daily as needed. Pre workout vitamin supplement mix     . Protein POWD Take 1 scoop by mouth 2 (two) times daily.     No current facility-administered medications on file prior to visit.     No Known Allergies  Family History  Problem Relation Age of Onset  . Heart disease Mother        CABG-onset in her early 49's  . Diabetes Mother 61       had DM from her 62's  . Hypertension Mother   . Prostate cancer Father 34       dx in his 36s  . Stroke Neg Hx   . Colon cancer Neg Hx     BP 134/86 (BP Location: Right Arm, Patient Position: Sitting, Cuff Size: Normal)   Pulse 65   Ht 6' (1.829 m)   Wt 213 lb (96.6 kg)   SpO2 97%   BMI 28.89 kg/m    Review of Systems He has pain and swelling at the injection site, x 5 days.  He denies LOC    Objective:   Physical Exam VITAL SIGNS:  See vs page GENERAL: no  distress SKIN:  Insulin injection sites at the anterior abdomen are normal.  I cannot appreciate the area in question Pulses: dorsalis pedis intact bilat.   MSK: no deformity of the feet CV: no leg edema Skin:  no ulcer on the feet.  normal color and temp on the feet. Neuro: sensation is intact to touch on the feet    Lab Results  Component Value Date   HGBA1C 8.1 (A) 06/23/2018       Assessment & Plan:  inject site swelling, new, uncertain etiology Insulin-requiring type 2 DM, with renal insuff: he needs increased rx   Patient Instructions  check your blood sugar twice a day.  vary the time of day when you check, between before the 3 meals, and at bedtime.  also check if you have symptoms of your blood sugar being too high or too low.  please keep a record of the readings and bring it to your next appointment here (or you can bring the meter itself).  You can write it on any piece of paper.  please call us sooner if your blood sugar goes below 70, or if you have a lot of readings over 200. Please change the insulin to "tresiba."  We'll do the prior auth for this, if necessary.   On this type of insulin schedule, you should eat meals on a regular schedule.  If a meal is missed or significantly delayed, your blood sugar could go low.   Please come back for a follow-up appointment in 2 months.

## 2018-06-23 NOTE — Patient Instructions (Addendum)
check your blood sugar twice a day.  vary the time of day when you check, between before the 3 meals, and at bedtime.  also check if you have symptoms of your blood sugar being too high or too low.  please keep a record of the readings and bring it to your next appointment here (or you can bring the meter itself).  You can write it on any piece of paper.  please call us sooner if your blood sugar goes below 70, or if you have a lot of readings over 200. Please change the insulin to "tresiba."  We'll do the prior auth for this, if necessary.   On this type of insulin schedule, you should eat meals on a regular schedule.  If a meal is missed or significantly delayed, your blood sugar could go low.   Please come back for a follow-up appointment in 2 months.

## 2018-06-24 ENCOUNTER — Ambulatory Visit: Payer: 59 | Admitting: Endocrinology

## 2018-08-05 ENCOUNTER — Other Ambulatory Visit (INDEPENDENT_AMBULATORY_CARE_PROVIDER_SITE_OTHER): Payer: 59

## 2018-08-05 ENCOUNTER — Encounter

## 2018-08-05 ENCOUNTER — Encounter: Payer: Self-pay | Admitting: Gastroenterology

## 2018-08-05 ENCOUNTER — Ambulatory Visit (INDEPENDENT_AMBULATORY_CARE_PROVIDER_SITE_OTHER): Payer: 59 | Admitting: Gastroenterology

## 2018-08-05 VITALS — BP 112/70 | HR 76 | Ht 71.25 in | Wt 209.0 lb

## 2018-08-05 DIAGNOSIS — R945 Abnormal results of liver function studies: Secondary | ICD-10-CM

## 2018-08-05 DIAGNOSIS — R7989 Other specified abnormal findings of blood chemistry: Secondary | ICD-10-CM

## 2018-08-05 DIAGNOSIS — R198 Other specified symptoms and signs involving the digestive system and abdomen: Secondary | ICD-10-CM | POA: Diagnosis not present

## 2018-08-05 LAB — HEPATIC FUNCTION PANEL
ALT: 52 U/L (ref 0–53)
AST: 46 U/L — ABNORMAL HIGH (ref 0–37)
Albumin: 4.3 g/dL (ref 3.5–5.2)
Alkaline Phosphatase: 111 U/L (ref 39–117)
Bilirubin, Direct: 0.4 mg/dL — ABNORMAL HIGH (ref 0.0–0.3)
Total Bilirubin: 2.4 mg/dL — ABNORMAL HIGH (ref 0.2–1.2)
Total Protein: 8.2 g/dL (ref 6.0–8.3)

## 2018-08-05 NOTE — Patient Instructions (Signed)
If you are age 54 or older, your body mass index should be between 23-30. Your Body mass index is 28.95 kg/m. If this is out of the aforementioned range listed, please consider follow up with your Primary Care Provider.  If you are age 52 or younger, your body mass index should be between 19-25. Your Body mass index is 28.95 kg/m. If this is out of the aformentioned range listed, please consider follow up with your Primary Care Provider.   Your provider has requested that you go to the basement level for lab work before leaving today. Press "B" on the elevator. The lab is located at the first door on the left as you exit the elevator.  It was a pleasure to see you today!  Dr. Loletha Carrow

## 2018-08-05 NOTE — Progress Notes (Signed)
[facility] Progress Note  Chief Complaint: Elevated LFTs and abdominal pain  Subjective  History:  Wayne Webb was sent for reevaluation for abdominal pain and elevated LFTs.  I saw him for this in January of this year, and suspected NAFLD.  However, work-up revealed a normal ultrasound, but elevated ANA at 1:80 as well as mildly elevated ASMA and AMA bodies.  Iron studies were normal.  I spoke with him about these results at that time, and recommended a liver biopsy.  He wanted to consider it further after discussion with his wife, but I did not hear back from him about it. Last colonoscopy with Dr. Deatra Ina on 03/01/2014 was normal except for a 2 mm descending colon polypoid tissue, pathology which revealed benign lymphoid tissue.  Wayne Webb called for an appointment to readdress his liver labs, but also some other concerns he had.  For a few months he noticed his left upper abdominal wall was more prominent than the right, he feels it is protuberant and somewhat puffy.  Separately, he also has had an intermittent sharp pain at his periumbilical insulin injection site.  He brought this to the attention of both primary care and endocrinology recent visits.  He denies abdominal pain, altered bowel habits, rectal bleeding, nausea, vomiting, dysphagia. When his hemoglobin A1c was significantly elevated earlier this year, he purposely lost 15 pounds to get better control.  ROS: Cardiovascular:  no chest pain Respiratory: no dyspnea  The patient's Past Medical, Family and Social History were reviewed and are on file in the EMR.  Objective:  Med list reviewed  Current Outpatient Medications:  .  aspirin EC 81 MG tablet, Take 81 mg by mouth daily., Disp: , Rfl:  .  azelastine (ASTELIN) 0.1 % nasal spray, Place 2 sprays into both nostrils at bedtime as needed for rhinitis or allergies., Disp: 90 mL, Rfl: 3 .  Insulin Degludec (TRESIBA FLEXTOUCH) 200 UNIT/ML SOPN, Inject 70 Units into the skin  daily. And pen needles 1/day, Disp: 27 pen, Rfl: 3 .  losartan (COZAAR) 50 MG tablet, Take 1 tablet (50 mg total) by mouth daily., Disp: 90 tablet, Rfl: 2 .  OVER THE COUNTER MEDICATION, Take 4 capsules by mouth daily. Nitric oxide supplement, Disp: , Rfl:  .  OVER THE COUNTER MEDICATION, Take 1 application by mouth daily as needed. Pre workout vitamin supplement mix , Disp: , Rfl:  .  Protein POWD, Take 1 scoop by mouth 2 (two) times daily., Disp: , Rfl:    Vital signs in last 24 hrs: Vitals:   08/05/18 0935  BP: 112/70  Pulse: 76    Physical Exam  He is well-appearing  HEENT: sclera anicteric, oral mucosa moist without lesions  Neck: supple, no thyromegaly, JVD or lymphadenopathy  Cardiac: RRR without murmurs, S1S2 heard, no peripheral edema  Pulm: clear to auscultation bilaterally, normal RR and effort noted  Abdomen: soft, no tenderness, with active bowel sounds. No guarding or palpable hepatosplenomegaly.  When standing, he has mild asymmetry of the abdominal wall with protuberance of the left upper abdomen.  This asymmetry is no longer present when supine.  There is no mass, fullness tenderness or splenomegaly.  There is no erythema, tenderness or fluctuance of the abdominal wall.    Skin; warm and dry, no jaundice or rash  Recent Labs:  Hemoglobin A1c as high as 12.2 on 11/23/2017, more recently 8.1 on 06/23/2018  CMP Latest Ref Rng & Units 08/05/2018 06/20/2018 09/27/2017  Glucose 70 -  99 mg/dL - 183(H) 396(H)  BUN 6 - 23 mg/dL - 45(H) 34(H)  Creatinine 0.40 - 1.50 mg/dL - 1.73(H) 1.65(H)  Sodium 135 - 145 mEq/L - 136 133(L)  Potassium 3.5 - 5.1 mEq/L - 4.0 4.4  Chloride 96 - 112 mEq/L - 99 95(L)  CO2 19 - 32 mEq/L - 30 28  Calcium 8.4 - 10.5 mg/dL - 10.8(H) 9.9  Total Protein 6.0 - 8.3 g/dL 8.2 - 8.0  Total Bilirubin 0.2 - 1.2 mg/dL 2.4(H) - 0.6  Alkaline Phos 39 - 117 U/L 111 - 147(H)  AST 0 - 37 U/L 46(H) - 44(H)  ALT 0 - 53 U/L 52 - 61(H)    January 2019  right upper quadrant ultrasound as noted above  @ASSESSMENTPLANBEGIN @ Assessment: Encounter Diagnoses  Name Primary?  . Abdominal wall asymmetry Yes  . LFT elevation    He appears to have asymmetry of the dominant wall musculature, now evident after purposeful weight loss.  He does not have any other digestive symptoms, and no other findings on exam to suggest an underlying mass lesion. It seems like he is having persistent pain at insulin injection reaction even though he is switched to other locations.  There is no apparent infection or other of normality at that site.  Regarding his elevated LFTs, they were last checked in January and labs suggested possible autoimmune condition.  He was still wondering what a liver biopsy was like and why it was necessary.  I explained that if there might be underlying autoimmune liver disease, it could be a low-grade smoldering condition that might still need treatment even if LFTs are only mildly elevated.  We agreed to recheck his hepatic function panel today as well as autoimmune markers and reevaluate.  Total time 25 minutes, over half spent face-to-face with patient in counseling and coordination of care.   Nelida Meuse III

## 2018-08-10 LAB — MITOCHONDRIAL ANTIBODIES: Mitochondrial M2 Ab, IgG: <=20 U

## 2018-08-10 LAB — ANA: Anti Nuclear Antibody(ANA): NEGATIVE

## 2018-08-10 LAB — ANTI-SMOOTH MUSCLE ANTIBODY, IGG: Actin (Smooth Muscle) Antibody (IGG): 22 U — ABNORMAL HIGH (ref <20)

## 2018-08-24 ENCOUNTER — Other Ambulatory Visit: Payer: Self-pay | Admitting: Endocrinology

## 2018-08-30 ENCOUNTER — Other Ambulatory Visit: Payer: Self-pay | Admitting: Internal Medicine

## 2018-09-18 ENCOUNTER — Other Ambulatory Visit: Payer: Self-pay | Admitting: Endocrinology

## 2018-09-30 ENCOUNTER — Encounter: Payer: Self-pay | Admitting: Internal Medicine

## 2018-09-30 ENCOUNTER — Ambulatory Visit (INDEPENDENT_AMBULATORY_CARE_PROVIDER_SITE_OTHER): Payer: 59 | Admitting: Internal Medicine

## 2018-09-30 VITALS — BP 128/70 | HR 76 | Temp 97.6°F | Resp 16 | Ht 71.0 in | Wt 213.0 lb

## 2018-09-30 DIAGNOSIS — Z0000 Encounter for general adult medical examination without abnormal findings: Secondary | ICD-10-CM | POA: Diagnosis not present

## 2018-09-30 LAB — COMPREHENSIVE METABOLIC PANEL
ALT: 45 U/L (ref 0–53)
AST: 43 U/L — ABNORMAL HIGH (ref 0–37)
Albumin: 4.2 g/dL (ref 3.5–5.2)
Alkaline Phosphatase: 152 U/L — ABNORMAL HIGH (ref 39–117)
BUN: 26 mg/dL — ABNORMAL HIGH (ref 6–23)
CO2: 29 mEq/L (ref 19–32)
Calcium: 9.4 mg/dL (ref 8.4–10.5)
Chloride: 100 mEq/L (ref 96–112)
Creatinine, Ser: 1.58 mg/dL — ABNORMAL HIGH (ref 0.40–1.50)
GFR: 58.94 mL/min — ABNORMAL LOW (ref >60.00)
Glucose, Bld: 230 mg/dL — ABNORMAL HIGH (ref 70–99)
Potassium: 3.8 mEq/L (ref 3.5–5.1)
Sodium: 137 mEq/L (ref 135–145)
Total Bilirubin: 1 mg/dL (ref 0.2–1.2)
Total Protein: 7.8 g/dL (ref 6.0–8.3)

## 2018-09-30 LAB — LDL CHOLESTEROL, DIRECT: Direct LDL: 110 mg/dL

## 2018-09-30 LAB — CBC WITH DIFFERENTIAL/PLATELET
Basophils Absolute: 0.1 10*3/uL (ref 0.0–0.1)
Basophils Relative: 1.2 % (ref 0.0–3.0)
Eosinophils Absolute: 0.1 10*3/uL (ref 0.0–0.7)
Eosinophils Relative: 3.3 % (ref 0.0–5.0)
HCT: 40.5 % (ref 39.0–52.0)
Hemoglobin: 14 g/dL (ref 13.0–17.0)
Lymphocytes Relative: 20.5 % (ref 12.0–46.0)
Lymphs Abs: 0.9 10*3/uL (ref 0.7–4.0)
MCHC: 34.5 g/dL (ref 30.0–36.0)
MCV: 84.4 fl (ref 78.0–100.0)
Monocytes Absolute: 1.1 10*3/uL — ABNORMAL HIGH (ref 0.1–1.0)
Monocytes Relative: 20 % — ABNORMAL HIGH (ref 3.0–12.0)
Neutro Abs: 2.4 10*3/uL (ref 1.4–7.7)
Neutrophils Relative %: 51.8 % (ref 43.0–77.0)
Platelets: 182 10*3/uL (ref 150.0–400.0)
RBC: 4.8 Mil/uL (ref 4.22–5.81)
RDW: 12.4 % (ref 11.5–15.5)
WBC: 4.6 10*3/uL (ref 4.0–10.5)

## 2018-09-30 LAB — LIPID PANEL
Cholesterol: 170 mg/dL (ref 0–200)
HDL: 40.3 mg/dL (ref >39.00)
NonHDL: 129.66
Total CHOL/HDL Ratio: 4
Triglycerides: 269 mg/dL — ABNORMAL HIGH (ref 0.0–149.0)
VLDL: 53.8 mg/dL — ABNORMAL HIGH (ref 0.0–40.0)

## 2018-09-30 LAB — PSA: PSA: 1.99 ng/mL (ref 0.10–4.00)

## 2018-09-30 LAB — TSH: TSH: 0.59 u[IU]/mL (ref 0.35–4.50)

## 2018-09-30 NOTE — Progress Notes (Signed)
Subjective:    Patient ID: Wayne Webb, male    DOB: May 09, 1966, 52 y.o.   MRN: [id number]  DOS:  09/30/2018 Type of visit - description : CPX  Since the last office visit he is doing well.  Has increased his physical activity and reports no problems  Review of Systems  A 14 point review of systems is negative    Past Medical History:  Diagnosis Date  . ALLERGIC RHINITIS 03/15/2007  . CRI (chronic renal insufficiency)    CRI, creat 1.4 , renal u/s 04-2011 neg  . DIABETES MELLITUS, TYPE II 03/15/2007  . Diplopia 12/24/2009  . ERECTILE DYSFUNCTION 03/15/2007  . GOITER, MULTINODULAR 03/21/2010  . HYPERLIPIDEMIA 03/15/2007    Past Surgical History:  Procedure Laterality Date  . LASIK  10-2012    Social History   Socioeconomic History  . Marital status: Married    Spouse name: Not on file  . Number of children: 1  . Years of education: Not on file  . Highest education level: Not on file  Occupational History  . 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 Financial resource strain: Not on file  . Food insecurity:    Worry: Not on file    Inability: Not on file  . Transportation needs:    Medical: Not on file    Non-medical: Not on file  Tobacco Use  . Smoking status: Former Smoker    Packs/day: 0.50    Years: 10.00    Pack years: 5.00    Types: Cigarettes    Last attempt to quit: 10/26/1993    Years since quitting: 24.9  . Smokeless tobacco: Never Used  Substance and Sexual Activity  . Alcohol use: No    Comment:    . Drug use: No  . Sexual activity: Not on file  Lifestyle  . Physical activity:    Days per week: Not on file    Minutes per session: Not on file  . Stress: Not on file  Relationships  . Social connections:    Talks on phone: Not on file    Gets together: Not on file    Attends religious service: Not on file    Active member of club or organization: Not on file    Attends meetings of clubs or organizations: Not  on file    Relationship status: Not on file  . Intimate partner violence:    Fear of current or ex partner: Not on file    Emotionally abused: Not on file    Physically abused: Not on file    Forced sexual activity: Not on file  Other Topics Concern  . Not on file  Social History Narrative   Divorced. Remarried : pt , wife      Family History  Problem Relation Age of Onset  . Heart disease Mother        CABG-onset in her early 86's  . Diabetes Mother 60       had DM from her 40's  . Hypertension Mother   . Prostate cancer Father 39       dx in his 30s  . Stroke Neg Hx   . Colon cancer Neg Hx      Allergies as of 09/30/2018   No Known Allergies     Medication List        Accurate as of 09/30/18 11:59 PM. Always use your most recent med list.  aspirin EC 81 MG tablet Take 81 mg by mouth daily.   azelastine 0.1 % nasal spray Commonly known as:  ASTELIN Place 2 sprays into both nostrils at bedtime as needed for rhinitis or allergies.   Insulin Degludec 200 UNIT/ML Sopn Inject 70 Units into the skin daily. And pen needles 1/day   losartan 50 MG tablet Commonly known as:  COZAAR Take 1 tablet (50 mg total) by mouth daily.   ONE TOUCH ULTRA TEST test strip Generic drug:  glucose blood USE 2 TIMES DAILY AS  DIRECTED TO CHECK BLOOD  SUGAR   OVER THE COUNTER MEDICATION Take 4 capsules by mouth daily. Nitric oxide supplement   OVER THE COUNTER MEDICATION Take 1 application by mouth daily as needed. Pre workout vitamin supplement mix   Protein Powd Take 1 scoop by mouth 2 (two) times daily.           Objective:   Physical Exam BP 128/70 (BP Location: Left Arm, Patient Position: Sitting, Cuff Size: Normal)   Pulse 76   Temp 97.6 F (36.4 C) (Oral)   Resp 16   Ht 5\' 11"  (1.803 m)   Wt 213 lb (96.6 kg)   SpO2 98%   BMI 29.71 kg/m  General: Well developed, NAD, BMI noted Neck: No  thyromegaly  HEENT:  Normocephalic . Face symmetric,  atraumatic Lungs:  Few dry crackles at both bases?   Normal respiratory effort, no intercostal retractions, no accessory muscle use. Heart: RRR,  no murmur.  No pretibial edema bilaterally  Abdomen:  Not distended, soft, non-tender. No rebound or rigidity.   Skin: Exposed areas without rash. Not pale. Not jaundice Neurologic:  alert & oriented X3.  Speech normal, gait appropriate for age and unassisted Strength symmetric and appropriate for age.  Psych: Cognition and judgment appear intact.  Cooperative with normal attention span and concentration.  Behavior appropriate. No anxious or depressed appearing.     Assessment & Plan:   Assessment DM - dr Loanne Drilling Hyperlipidemia (h/o increased LFTs w/ simva 2013) Goiter -- per dr Loanne Drilling, last [place] 06/2016 stable CKD - renal US (-) 2012 ; not f/u  by nephrology as off 09/2018 ED Elevated LFTs Ultrasound 2015 negative,  hepatitis serologies (-) 01-2015: ferritin (-) previously slt elevated; wnl  transferrin saturation, ANA, ceruloplasmin, anti smooth muscles ab. Alpha 1 antitrypsin. Saw GI 10/2017: U/S wnl, labs borderline abnormal was RX a liver Bx Pulmonary: --[facility] --Chronic Cough, better after ACEi changed to Diovan 03-2015---Dr. Wert --Abnormal CXR, Confirmed by CT, likely due to burnout sarcoidosis.   PLAN:  DM: Per endocrinology Hyperlipidemia: History of increased LFTs with simvastatin.  Checking labs CKD: Stable over time, checking labs.  Recommend to avoid NSAIDs and excessive protein supplements Increased LFTs: Last visit with GI October   2019, will forward them the results of today labs. RTC 1 year

## 2018-09-30 NOTE — Assessment & Plan Note (Addendum)
-  Td 2016;  pneumonia shot 23: 2015;  prevnar : 2015; flu shot  @ his job - (+) FH prostate cancer , DRE was wnl 2018, last PSA w/ increased velocity?, recheck today  - [facility]: Colonoscopy, 02/2014, 1 polyp, next 10 years - Has a very healthy lifestyle.  He is actually even more active at this point. --labs: CMP, FLP, CBC, TSH, PSA

## 2018-09-30 NOTE — Patient Instructions (Signed)
GO TO THE LAB : Get the blood work     GO TO THE FRONT DESK Schedule your next appointment   for a physical exam in 1 year 

## 2018-09-30 NOTE — Progress Notes (Signed)
Pre visit review using our clinic review tool, if applicable. No additional management support is needed unless otherwise documented below in the visit note. 

## 2018-10-02 NOTE — Assessment & Plan Note (Signed)
DM: Per endocrinology Hyperlipidemia: History of increased LFTs with simvastatin.  Checking labs CKD: Stable over time, checking labs.  Recommend to avoid NSAIDs and excessive protein supplements Increased LFTs: Last visit with GI October   2019, will forward them the results of today labs. RTC 1 year

## 2018-10-11 ENCOUNTER — Other Ambulatory Visit: Payer: Self-pay

## 2018-10-11 ENCOUNTER — Telehealth: Payer: Self-pay | Admitting: Endocrinology

## 2018-10-11 DIAGNOSIS — N183 Chronic kidney disease, stage 3 unspecified: Secondary | ICD-10-CM

## 2018-10-11 DIAGNOSIS — Z794 Long term (current) use of insulin: Secondary | ICD-10-CM

## 2018-10-11 DIAGNOSIS — E1122 Type 2 diabetes mellitus with diabetic chronic kidney disease: Secondary | ICD-10-CM

## 2018-10-11 MED ORDER — INSULIN PEN NEEDLE 31G X 5 MM MISC: 1.0000 | Freq: Every day | 3 refills | Status: DC

## 2018-10-11 NOTE — Telephone Encounter (Signed)
MEDICATION: Pen Needles Ultra Fione Mini #[id number]  PHARMACY: [facility] on [address]  IS THIS A 90 DAY SUPPLY : yes  IS PATIENT OUT OF MEDICATION: yes  IF NOT; HOW MUCH IS LEFT: none  LAST APPOINTMENT DATE: @11 /24/2019  NEXT APPOINTMENT DATE:@Visit  date not found  DO WE HAVE YOUR PERMISSION TO LEAVE A DETAILED MESSAGE:Yes  OTHER COMMENTS:    **Let patient know to contact pharmacy at the end of the day to make sure medication is ready. **  ** Please notify patient to allow 48-72 hours to process**  **Encourage patient to contact the pharmacy for refills or they can request refills through [facility]**

## 2018-10-11 NOTE — Telephone Encounter (Signed)
Rx has been sent as requested. 

## 2018-10-12 ENCOUNTER — Other Ambulatory Visit: Payer: Self-pay

## 2018-10-12 DIAGNOSIS — R945 Abnormal results of liver function studies: Principal | ICD-10-CM

## 2018-10-12 DIAGNOSIS — R7989 Other specified abnormal findings of blood chemistry: Secondary | ICD-10-CM

## 2018-11-14 ENCOUNTER — Telehealth: Payer: Self-pay

## 2018-11-14 NOTE — Telephone Encounter (Signed)
-----   Message from Algernon Huxley, RN sent at 10/12/2018  2:21 PM EST ----- Regarding: labs Pt needs labs done in 1 week, order in epic.

## 2018-11-14 NOTE — Telephone Encounter (Signed)
Pt aware, order in epic. 

## 2018-11-17 ENCOUNTER — Other Ambulatory Visit (INDEPENDENT_AMBULATORY_CARE_PROVIDER_SITE_OTHER): Payer: 59

## 2018-11-17 DIAGNOSIS — R945 Abnormal results of liver function studies: Secondary | ICD-10-CM | POA: Diagnosis not present

## 2018-11-17 DIAGNOSIS — R7989 Other specified abnormal findings of blood chemistry: Secondary | ICD-10-CM

## 2018-11-17 LAB — HEPATIC FUNCTION PANEL
ALT: 55 U/L — ABNORMAL HIGH (ref 0–53)
AST: 40 U/L — ABNORMAL HIGH (ref 0–37)
Albumin: 4.1 g/dL (ref 3.5–5.2)
Alkaline Phosphatase: 167 U/L — ABNORMAL HIGH (ref 39–117)
Bilirubin, Direct: 0.2 mg/dL (ref 0.0–0.3)
Total Bilirubin: 0.8 mg/dL (ref 0.2–1.2)
Total Protein: 7.9 g/dL (ref 6.0–8.3)

## 2018-11-17 LAB — GAMMA GT: GGT: 210 U/L — ABNORMAL HIGH (ref 7–51)

## 2018-11-18 ENCOUNTER — Other Ambulatory Visit: Payer: Self-pay

## 2018-11-18 DIAGNOSIS — R7989 Other specified abnormal findings of blood chemistry: Secondary | ICD-10-CM

## 2018-11-18 DIAGNOSIS — R945 Abnormal results of liver function studies: Principal | ICD-10-CM

## 2018-12-27 ENCOUNTER — Other Ambulatory Visit: Payer: Self-pay | Admitting: Internal Medicine

## 2018-12-27 NOTE — Telephone Encounter (Signed)
Current dose losartan 50 mg. Okay losartan 25 mg 2 tablets daily

## 2018-12-27 NOTE — Telephone Encounter (Signed)
Losartan 50mg  and 100mg  on back order. Pharmacy requesting change to losartan 25mg . Please advise.

## 2018-12-27 NOTE — Telephone Encounter (Signed)
Losartan 25mg  2 tabs daily sent to pharmacy. Pt informed of change via MyChart.

## 2019-02-15 ENCOUNTER — Other Ambulatory Visit: Payer: Self-pay

## 2019-02-15 DIAGNOSIS — N183 Chronic kidney disease, stage 3 unspecified: Secondary | ICD-10-CM

## 2019-02-15 DIAGNOSIS — Z794 Long term (current) use of insulin: Secondary | ICD-10-CM

## 2019-02-15 DIAGNOSIS — E1122 Type 2 diabetes mellitus with diabetic chronic kidney disease: Secondary | ICD-10-CM

## 2019-02-15 MED ORDER — INSULIN PEN NEEDLE 31G X 5 MM MISC: 1.0000 | Freq: Every day | 3 refills | Status: DC

## 2019-02-23 ENCOUNTER — Telehealth: Payer: Self-pay | Admitting: Gastroenterology

## 2019-02-23 ENCOUNTER — Other Ambulatory Visit (INDEPENDENT_AMBULATORY_CARE_PROVIDER_SITE_OTHER): Payer: 59

## 2019-02-23 DIAGNOSIS — R945 Abnormal results of liver function studies: Secondary | ICD-10-CM

## 2019-02-23 DIAGNOSIS — R7989 Other specified abnormal findings of blood chemistry: Secondary | ICD-10-CM

## 2019-02-23 LAB — HEPATIC FUNCTION PANEL
ALT: 89 U/L — ABNORMAL HIGH (ref 0–53)
AST: 56 U/L — ABNORMAL HIGH (ref 0–37)
Albumin: 4.2 g/dL (ref 3.5–5.2)
Alkaline Phosphatase: 169 U/L — ABNORMAL HIGH (ref 39–117)
Bilirubin, Direct: 0.1 mg/dL (ref 0.0–0.3)
Total Bilirubin: 0.7 mg/dL (ref 0.2–1.2)
Total Protein: 8.1 g/dL (ref 6.0–8.3)

## 2019-02-23 NOTE — Telephone Encounter (Signed)
Spoke with patient to inform patient he did not need an appointment for labs. Patient stated he would come in today with a mask for repeat labs.

## 2019-02-23 NOTE — Telephone Encounter (Signed)
Pt received letter stating that he is due for labs, there is an active request order from January but was not sure if that is the updated order, Pls call pt to inform as he is planning on coming today.

## 2019-02-27 ENCOUNTER — Other Ambulatory Visit: Payer: Self-pay

## 2019-02-27 DIAGNOSIS — R7989 Other specified abnormal findings of blood chemistry: Secondary | ICD-10-CM

## 2019-02-27 DIAGNOSIS — R945 Abnormal results of liver function studies: Principal | ICD-10-CM

## 2019-06-27 ENCOUNTER — Other Ambulatory Visit: Payer: Self-pay

## 2019-06-28 LAB — HM DIABETES EYE EXAM

## 2019-06-29 ENCOUNTER — Other Ambulatory Visit: Payer: Self-pay

## 2019-06-29 ENCOUNTER — Ambulatory Visit (INDEPENDENT_AMBULATORY_CARE_PROVIDER_SITE_OTHER): Payer: 59 | Admitting: Endocrinology

## 2019-06-29 ENCOUNTER — Encounter: Payer: Self-pay | Admitting: Endocrinology

## 2019-06-29 VITALS — BP 124/82 | HR 82 | Ht 71.0 in | Wt 210.4 lb

## 2019-06-29 DIAGNOSIS — N529 Male erectile dysfunction, unspecified: Secondary | ICD-10-CM | POA: Diagnosis not present

## 2019-06-29 DIAGNOSIS — E1122 Type 2 diabetes mellitus with diabetic chronic kidney disease: Secondary | ICD-10-CM | POA: Diagnosis not present

## 2019-06-29 DIAGNOSIS — N183 Chronic kidney disease, stage 3 unspecified: Secondary | ICD-10-CM

## 2019-06-29 DIAGNOSIS — Z794 Long term (current) use of insulin: Secondary | ICD-10-CM | POA: Diagnosis not present

## 2019-06-29 LAB — POCT GLYCOSYLATED HEMOGLOBIN (HGB A1C): Hemoglobin A1C: 12.3 % — AB (ref 4.0–5.6)

## 2019-06-29 MED ORDER — TRESIBA FLEXTOUCH 200 UNIT/ML ~~LOC~~ SOPN: 80.0000 [IU] | PEN_INJECTOR | Freq: Every day | SUBCUTANEOUS | 3 refills | Status: DC

## 2019-06-29 NOTE — Progress Notes (Signed)
Subjective:    Patient ID: Wayne Webb, male    DOB: 21-Apr-1966, 53 y.o.   MRN: [id number]  HPI Pt returns for f/u of diabetes mellitus: DM type: Insulin-requiring type 2 Dx'ed: 123XX123 Complications: retinopathy and renal insuff.   Therapy: insulin since 2011.  DKA: never Severe hypoglycemia: never.  Pancreatitis: never Other: he declines multiple daily injections; he works 3rd shift.   Interval history: pt says ins declined tresiba.  no cbg record, but states cbg's vary from 130-250.  He says he never misses the insulin.  Pt says he is uncertain how long 1 pen lasts.   Past Medical History:  Diagnosis Date  . ALLERGIC RHINITIS 03/15/2007  . CRI (chronic renal insufficiency)    CRI, creat 1.4 , renal u/s 04-2011 neg  . DIABETES MELLITUS, TYPE II 03/15/2007  . Diplopia 12/24/2009  . ERECTILE DYSFUNCTION 03/15/2007  . GOITER, MULTINODULAR 03/21/2010  . HYPERLIPIDEMIA 03/15/2007    Past Surgical History:  Procedure Laterality Date  . LASIK  10-2012    Social History   Socioeconomic History  . Marital status: Married    Spouse name: Not on file  . Number of children: 1  . Years of education: Not on file  . Highest education level: Not on file  Occupational History  . 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 Financial resource strain: Not on file  . Food insecurity    Worry: Not on file    Inability: Not on file  . Transportation needs    Medical: Not on file    Non-medical: Not on file  Tobacco Use  . Smoking status: Former Smoker    Packs/day: 0.50    Years: 10.00    Pack years: 5.00    Types: Cigarettes    Quit date: 10/26/1993    Years since quitting: 25.6  . Smokeless tobacco: Never Used  Substance and Sexual Activity  . Alcohol use: No    Comment:    . Drug use: No  . Sexual activity: Not on file  Lifestyle  . Physical activity    Days per week: Not on file    Minutes per session: Not on file  . Stress: Not on file   Relationships  . Social Herbalist on phone: Not on file    Gets together: Not on file    Attends religious service: Not on file    Active member of club or organization: Not on file    Attends meetings of clubs or organizations: Not on file    Relationship status: Not on file  . Intimate partner violence    Fear of current or ex partner: Not on file    Emotionally abused: Not on file    Physically abused: Not on file    Forced sexual activity: Not on file  Other Topics Concern  . Not on file  Social History Narrative   Divorced. Remarried : pt , wife     Current Outpatient Medications on File Prior to Visit  Medication Sig Dispense Refill  . aspirin EC 81 MG tablet Take 81 mg by mouth daily.    Marland Kitchen azelastine (ASTELIN) 0.1 % nasal spray Place 2 sprays into both nostrils at bedtime as needed for rhinitis or allergies. 90 mL 3  . Insulin Pen Needle (B-D UF III MINI PEN NEEDLES) 31G X 5 MM MISC 1 each by Does not apply route daily. Use to inject  insulin daily; E11.9 100 each 3  . losartan (COZAAR) 25 MG tablet Take 2 tablets (50 mg total) by mouth daily. 60 tablet 5  . ONE TOUCH ULTRA TEST test strip USE 2 TIMES DAILY AS  DIRECTED TO CHECK BLOOD  SUGAR 180 each 3  . OVER THE COUNTER MEDICATION Take 4 capsules by mouth daily. Nitric oxide supplement    . OVER THE COUNTER MEDICATION Take 1 application by mouth daily as needed. Pre workout vitamin supplement mix     . Protein POWD Take 1 scoop by mouth 2 (two) times daily.     No current facility-administered medications on file prior to visit.     No Known Allergies  Family History  Problem Relation Age of Onset  . Heart disease Mother        CABG-onset in her early 12's  . Diabetes Mother 79       had DM from her 70's  . Hypertension Mother   . Prostate cancer Father 51       dx in his 96s  . Stroke Neg Hx   . Colon cancer Neg Hx     BP 124/82 (BP Location: Left Arm, Patient Position: Sitting, Cuff Size: Normal)    Pulse 82   Ht 5\' 11"  (1.803 m)   Wt 210 lb 6.4 oz (95.4 kg)   SpO2 96%   BMI 29.34 kg/m   Review of Systems He denies hypoglycemia.  He has ED sxs.  (viagra caused blurry vision).      Objective:   Physical Exam VITAL SIGNS:  See vs page GENERAL: no distress Pulses: dorsalis pedis intact bilat.   MSK: no deformity of the feet CV: no leg edema Skin:  no ulcer on the feet.  normal color and temp on the feet. Neuro: sensation is intact to touch on the feet  Lab Results  Component Value Date   HGBA1C 12.3 (A) 06/29/2019   Lab Results  Component Value Date   TESTOSTERONE 467 06/29/2019      Assessment & Plan:  Insulin-requiring type 2 DM, with renal insuff: worse noncompliance with cbg recording and f/u ov's.  He is not a candidate for multiple daily injections ED, new to me.  Ref Urol.   Patient Instructions  I am really worried about how high your blood sugar is.  People with blood sugar as high as yours get sick and even die much sooner than those who keep it in a better range.   I have sent a prescription to your pharmacy, to increase the insulin to 80 units each morning. Blood tests are requested for you today.  We'll let you know about the results.  check your blood sugar twice a day.  vary the time of day when you check, between before the 3 meals, and at bedtime.  also check if you have symptoms of your blood sugar being too high or too low.  please keep a record of the readings and bring it to your next appointment here (or you can bring the meter itself).  You can write it on any piece of paper.  please call us sooner if your blood sugar goes below 70, or if you have a lot of readings over 200.   Please come back for a follow-up appointment in 1 month.

## 2019-06-29 NOTE — Patient Instructions (Addendum)
I am really worried about how high your blood sugar is.  People with blood sugar as high as yours get sick and even die much sooner than those who keep it in a better range.   I have sent a prescription to your pharmacy, to increase the insulin to 80 units each morning. Blood tests are requested for you today.  We'll let you know about the results.  check your blood sugar twice a day.  vary the time of day when you check, between before the 3 meals, and at bedtime.  also check if you have symptoms of your blood sugar being too high or too low.  please keep a record of the readings and bring it to your next appointment here (or you can bring the meter itself).  You can write it on any piece of paper.  please call us sooner if your blood sugar goes below 70, or if you have a lot of readings over 200.   Please come back for a follow-up appointment in 1 month.

## 2019-07-01 LAB — TESTOSTERONE,FREE AND TOTAL
Testosterone, Free: 8.5 pg/mL (ref 7.2–24.0)
Testosterone: 467 ng/dL (ref 264–916)

## 2019-07-13 ENCOUNTER — Other Ambulatory Visit (INDEPENDENT_AMBULATORY_CARE_PROVIDER_SITE_OTHER): Payer: 59

## 2019-07-13 DIAGNOSIS — R945 Abnormal results of liver function studies: Secondary | ICD-10-CM

## 2019-07-13 DIAGNOSIS — R7989 Other specified abnormal findings of blood chemistry: Secondary | ICD-10-CM

## 2019-07-13 LAB — HEPATIC FUNCTION PANEL
ALT: 46 U/L (ref 0–53)
AST: 42 U/L — ABNORMAL HIGH (ref 0–37)
Albumin: 4.1 g/dL (ref 3.5–5.2)
Alkaline Phosphatase: 123 U/L — ABNORMAL HIGH (ref 39–117)
Bilirubin, Direct: 0.1 mg/dL (ref 0.0–0.3)
Total Bilirubin: 1.3 mg/dL — ABNORMAL HIGH (ref 0.2–1.2)
Total Protein: 7.8 g/dL (ref 6.0–8.3)

## 2019-07-20 ENCOUNTER — Telehealth: Payer: Self-pay

## 2019-07-20 NOTE — Telephone Encounter (Signed)
-----   Message from Doran Stabler, MD sent at 07/14/2019  4:47 PM EDT ----- This patient's primary care provider copies me on periodic LFT results.  His LFTs remain mildly elevated, improved from what they were several months ago.  He has done this in the past with a will be up for some time and then decrease again.  It has been nearly a year since I saw him, please arrange an office visit with me.

## 2019-07-20 NOTE — Telephone Encounter (Signed)
Left a detailed message to cal and schedule a yearly follow up.

## 2019-07-22 ENCOUNTER — Other Ambulatory Visit: Payer: Self-pay | Admitting: Internal Medicine

## 2019-07-28 ENCOUNTER — Other Ambulatory Visit: Payer: Self-pay

## 2019-08-01 ENCOUNTER — Encounter: Payer: Self-pay | Admitting: Endocrinology

## 2019-08-01 ENCOUNTER — Ambulatory Visit (INDEPENDENT_AMBULATORY_CARE_PROVIDER_SITE_OTHER): Payer: 59 | Admitting: Endocrinology

## 2019-08-01 ENCOUNTER — Other Ambulatory Visit: Payer: Self-pay

## 2019-08-01 VITALS — BP 128/60 | HR 81 | Ht 71.0 in | Wt 213.6 lb

## 2019-08-01 DIAGNOSIS — Z794 Long term (current) use of insulin: Secondary | ICD-10-CM | POA: Diagnosis not present

## 2019-08-01 DIAGNOSIS — E1129 Type 2 diabetes mellitus with other diabetic kidney complication: Secondary | ICD-10-CM

## 2019-08-01 DIAGNOSIS — Z9119 Patient's noncompliance with other medical treatment and regimen: Secondary | ICD-10-CM | POA: Diagnosis not present

## 2019-08-01 DIAGNOSIS — N529 Male erectile dysfunction, unspecified: Secondary | ICD-10-CM

## 2019-08-01 LAB — BASIC METABOLIC PANEL
BUN: 41 mg/dL — ABNORMAL HIGH (ref 6–23)
CO2: 29 mEq/L (ref 19–32)
Calcium: 10.3 mg/dL (ref 8.4–10.5)
Chloride: 97 mEq/L (ref 96–112)
Creatinine, Ser: 1.8 mg/dL — ABNORMAL HIGH (ref 0.40–1.50)
GFR: 47.56 mL/min — ABNORMAL LOW (ref >60.00)
Glucose, Bld: 344 mg/dL — ABNORMAL HIGH (ref 70–99)
Potassium: 3.8 mEq/L (ref 3.5–5.1)
Sodium: 134 mEq/L — ABNORMAL LOW (ref 135–145)

## 2019-08-01 MED ORDER — ACCU-CHEK AVIVA CONNECT W/DEVICE KIT: 1.0000 | PACK | Freq: Once | 0 refills | Status: DC

## 2019-08-01 MED ORDER — ACCU-CHEK AVIVA PLUS VI STRP: 1.0000 | ORAL_STRIP | Freq: Two times a day (BID) | 3 refills | Status: DC

## 2019-08-01 MED ORDER — SILDENAFIL CITRATE 100 MG PO TABS: 50.0000 mg | ORAL_TABLET | Freq: Every day | ORAL | 11 refills | Status: DC | PRN

## 2019-08-01 NOTE — Progress Notes (Signed)
Subjective:    Patient ID: Wayne Webb, male    DOB: [birth year], 53 y.o.   MRN: [id number]  HPI Pt returns for f/u of diabetes mellitus: DM type: Insulin-requiring type 2 Dx'ed: 123XX123 Complications: retinopathy and renal insuff.   Therapy: insulin since 2011.  DKA: never Severe hypoglycemia: never.  Pancreatitis: never Other: he declines multiple daily injections; he works 3rd shift.   Interval history: He says he never misses the insulin.  He does not check cbg's.  pt states he feels well in general.   Past Medical History:  Diagnosis Date  . ALLERGIC RHINITIS 03/15/2007  . CRI (chronic renal insufficiency)    CRI, creat 1.4 , renal u/s 04-2011 neg  . DIABETES MELLITUS, TYPE II 03/15/2007  . Diplopia 12/24/2009  . ERECTILE DYSFUNCTION 03/15/2007  . GOITER, MULTINODULAR 03/21/2010  . HYPERLIPIDEMIA 03/15/2007    Past Surgical History:  Procedure Laterality Date  . LASIK  10-2012    Social History   Socioeconomic History  . Marital status: Married    Spouse name: Not on file  . Number of children: 1  . Years of education: Not on file  . Highest education level: Not on file  Occupational History  . 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 Financial resource strain: Not on file  . Food insecurity    Worry: Not on file    Inability: Not on file  . Transportation needs    Medical: Not on file    Non-medical: Not on file  Tobacco Use  . Smoking status: Former Smoker    Packs/day: 0.50    Years: 10.00    Pack years: 5.00    Types: Cigarettes    Quit date: 10/26/1993    Years since quitting: 25.7  . Smokeless tobacco: Never Used  Substance and Sexual Activity  . Alcohol use: No    Comment:    . Drug use: No  . Sexual activity: Not on file  Lifestyle  . Physical activity    Days per week: Not on file    Minutes per session: Not on file  . Stress: Not on file  Relationships  . Social Herbalist on phone: Not on file    Gets together: Not on file    Attends religious service: Not on file    Active member of club or organization: Not on file    Attends meetings of clubs or organizations: Not on file    Relationship status: Not on file  . Intimate partner violence    Fear of current or ex partner: Not on file    Emotionally abused: Not on file    Physically abused: Not on file    Forced sexual activity: Not on file  Other Topics Concern  . Not on file  Social History Narrative   Divorced. Remarried : pt , wife     Current Outpatient Medications on File Prior to Visit  Medication Sig Dispense Refill  . aspirin EC 81 MG tablet Take 81 mg by mouth daily.    Marland Kitchen azelastine (ASTELIN) 0.1 % nasal spray Place 2 sprays into both nostrils at bedtime as needed for rhinitis or allergies. 90 mL 3  . Insulin Degludec (TRESIBA FLEXTOUCH) 200 UNIT/ML SOPN Inject 80 Units into the skin daily. And pen needles 1/day 27 pen 3  . Insulin Pen Needle (B-D UF III MINI PEN NEEDLES) 31G X 5 MM MISC 1 each  by Does not apply route daily. Use to inject insulin daily; E11.9 100 each 3  . losartan (COZAAR) 25 MG tablet Take 2 tablets (50 mg total) by mouth daily. 180 tablet 1  . OVER THE COUNTER MEDICATION Take 4 capsules by mouth daily. Nitric oxide supplement    . OVER THE COUNTER MEDICATION Take 1 application by mouth daily as needed. Pre workout vitamin supplement mix     . Protein POWD Take 1 scoop by mouth 2 (two) times daily.     No current facility-administered medications on file prior to visit.     No Known Allergies  Family History  Problem Relation Age of Onset  . Heart disease Mother        CABG-onset in her early 64's  . Diabetes Mother 32       had DM from her 93's  . Hypertension Mother   . Prostate cancer Father 78       dx in his 92s  . Stroke Neg Hx   . Colon cancer Neg Hx     BP 128/60 (BP Location: Left Arm, Patient Position: Sitting, Cuff Size: Large)   Pulse 81   Ht 5\' 11"  (1.803 m)   Wt 213 lb  9.6 oz (96.9 kg)   SpO2 94%   BMI 29.79 kg/m   Review of Systems He denies hypoglycemia.  He has ED sxs.      Objective:   Physical Exam VITAL SIGNS:  See vs page GENERAL: no distress Pulses: dorsalis pedis intact bilat.   MSK: no deformity of the feet CV: no leg edema Skin:  no ulcer on the feet.  normal color and temp on the feet.   Neuro: sensation is intact to touch on the feet.   Lab Results  Component Value Date   HGBA1C 12.3 (A) 06/29/2019   Lab Results  Component Value Date   CREATININE 1.58 (H) 09/30/2018   BUN 26 (H) 09/30/2018   NA 137 09/30/2018   K 3.8 09/30/2018   CL 100 09/30/2018   CO2 29 09/30/2018   Lab Results  Component Value Date   TSH 0.59 09/30/2018   Lab Results  Component Value Date   TESTOSTERONE 467 06/29/2019        Assessment & Plan:  ED: he requests rx Insulin-requiring type 2 DM, with renal insuff Noncompliance with cbg recording: we'll check fructosamine, and increase insulin if necessary  Patient Instructions  I have sent prescriptions to your pharmacy, for Viagra, a new meter, and strips Blood tests are requested for you today.  We'll let you know about the results.  check your blood sugar twice a day.  vary the time of day when you check, between before the 3 meals, and at bedtime.  also check if you have symptoms of your blood sugar being too high or too low.  please keep a record of the readings and bring it to your next appointment here (or you can bring the meter itself).  You can write it on any piece of paper.  please call us sooner if your blood sugar goes below 70, or if you have a lot of readings over 200.   Please come back for a follow-up appointment in 1 month.

## 2019-08-01 NOTE — Patient Instructions (Addendum)
I have sent prescriptions to your pharmacy, for Viagra, a new meter, and strips Blood tests are requested for you today.  We'll let you know about the results.  check your blood sugar twice a day.  vary the time of day when you check, between before the 3 meals, and at bedtime.  also check if you have symptoms of your blood sugar being too high or too low.  please keep a record of the readings and bring it to your next appointment here (or you can bring the meter itself).  You can write it on any piece of paper.  please call us sooner if your blood sugar goes below 70, or if you have a lot of readings over 200.   Please come back for a follow-up appointment in 1 month.

## 2019-08-03 LAB — FRUCTOSAMINE: Fructosamine: 585 umol/L — ABNORMAL HIGH (ref 205–285)

## 2019-08-04 ENCOUNTER — Ambulatory Visit: Payer: 59

## 2019-08-28 ENCOUNTER — Other Ambulatory Visit: Payer: Self-pay

## 2019-08-28 ENCOUNTER — Encounter: Payer: Self-pay | Admitting: Gastroenterology

## 2019-08-28 ENCOUNTER — Ambulatory Visit (INDEPENDENT_AMBULATORY_CARE_PROVIDER_SITE_OTHER): Payer: 59 | Admitting: Gastroenterology

## 2019-08-28 VITALS — BP 130/70 | HR 75 | Temp 98.4°F | Ht 71.75 in | Wt 217.0 lb

## 2019-08-28 DIAGNOSIS — R140 Abdominal distension (gaseous): Secondary | ICD-10-CM | POA: Diagnosis not present

## 2019-08-28 DIAGNOSIS — R945 Abnormal results of liver function studies: Secondary | ICD-10-CM

## 2019-08-28 DIAGNOSIS — R7989 Other specified abnormal findings of blood chemistry: Secondary | ICD-10-CM

## 2019-08-28 NOTE — Progress Notes (Signed)
[facility] Progress Note  Chief Complaint: Elevated LFTs  Subjective  History:  Wayne Webb follows up for elevated LFTs, he is followed by Dr. Larose Kells as well as his endocrinologist Dr. Loanne Drilling. I saw him for this in early 2019 with lab results as noted below. He has no family history of liver disease.  Occasional alcohol amounting to a few drinks on the weekend, which is perhaps more than his usual.  He had some increased stress over the loss of his father in April.  His blood sugars lately not been under as good control. Wayne Webb is still bothered intermittently by a bloating sensation in the left upper quadrant after some meals.  It seems to come and go, and he still notices an asymmetry of the left upper abdominal wall when he stands.  ROS: Cardiovascular:  no chest pain Respiratory: no dyspnea Denies nausea vomiting dysphagia altered bowel habits weight loss or rectal bleeding  The patient's Past Medical, Family and Social History were reviewed and are on file in the EMR.  Objective:  Med list reviewed  Current Outpatient Medications:  .  aspirin EC 81 MG tablet, Take 81 mg by mouth daily., Disp: , Rfl:  .  azelastine (ASTELIN) 0.1 % nasal spray, Place 2 sprays into both nostrils at bedtime as needed for rhinitis or allergies., Disp: 90 mL, Rfl: 3 .  glucose blood (ACCU-CHEK AVIVA PLUS) test strip, 1 each by Other route 2 (two) times daily. And lancets 2/day, Disp: 200 each, Rfl: 3 .  Insulin Degludec (TRESIBA FLEXTOUCH) 200 UNIT/ML SOPN, Inject 80 Units into the skin daily. And pen needles 1/day, Disp: 27 pen, Rfl: 3 .  Insulin Pen Needle (B-D UF Webb MINI PEN NEEDLES) 31G X 5 MM MISC, 1 each by Does not apply route daily. Use to inject insulin daily; E11.9, Disp: 100 each, Rfl: 3 .  losartan (COZAAR) 25 MG tablet, Take 2 tablets (50 mg total) by mouth daily., Disp: 180 tablet, Rfl: 1 .  OVER THE COUNTER MEDICATION, Take 4 capsules by mouth daily. Nitric oxide supplement,  Disp: , Rfl:  .  OVER THE COUNTER MEDICATION, Take 1 application by mouth daily as needed. Pre workout vitamin supplement mix , Disp: , Rfl:  .  Protein POWD, Take 1 scoop by mouth 2 (two) times daily., Disp: , Rfl:  .  sildenafil (VIAGRA) 100 MG tablet, Take 0.5-1 tablets (50-100 mg total) by mouth daily as needed for erectile dysfunction., Disp: 10 tablet, Rfl: 11   Vital signs in last 24 hrs: Vitals:   08/28/19 0830  BP: 130/70  Pulse: 75  Temp: 98.4 F (36.9 C)    Physical Exam  Well-appearing  HEENT: sclera anicteric, oral mucosa moist without lesions  Neck: supple, no thyromegaly, JVD or lymphadenopathy  Cardiac: RRR without murmurs, S1S2 heard, no peripheral edema  Pulm: clear to auscultation bilaterally, normal RR and effort noted  Abdomen: soft, no tenderness, with active bowel sounds. No guarding or palpable hepatosplenomegaly.  Prominence of left upper abdominal wall compared to right when he is standing, no longer evident when supine.  No splenomegaly mass or tenderness.  No bulging flanks or clinical stigmata of liver disease.  Skin; warm and dry, no jaundice or rash  Recent Labs:  CMP Latest Ref Rng & Units 08/01/2019 07/13/2019 02/23/2019  Glucose 70 - 99 mg/dL 344(H) - -  BUN 6 - 23 mg/dL 41(H) - -  Creatinine 0.40 - 1.50 mg/dL 1.80(H) - -  Sodium  135 - 145 mEq/L 134(L) - -  Potassium 3.5 - 5.1 mEq/L 3.8 - -  Chloride 96 - 112 mEq/L 97 - -  CO2 19 - 32 mEq/L 29 - -  Calcium 8.4 - 10.5 mg/dL 10.3 - -  Total Protein 6.0 - 8.3 g/dL - 7.8 8.1  Total Bilirubin 0.2 - 1.2 mg/dL - 1.3(H) 0.7  Alkaline Phos 39 - 117 U/L - 123(H) 169(H)  AST 0 - 37 U/L - 42(H) 56(H)  ALT 0 - 53 U/L - 46 89(H)   Hemoglobin A1c of 12.3 in September  December 2019: Alkaline phosphatase 152, total bilirubin 1.0, AST 43, ALT 45 January 2020: Alkaline phosphatase 167, total bilirubin 0.8, AST 40, ALT 45  In January 2019, had positive ANA but only at 1-80, nucleolar pattern.  AMA  equivocal at 20.6.  In October 2019, ANA negative, AMA also negative  Right upper quadrant ultrasound was normal January 2019  Jan 2019 Iron 82, Transferrin 288 (20% sat),  Ferritin 578  Last colonoscopy by Dr. Deatra Ina May 2015.  Polypoid lesion left colon was benign lymphoid tissue.  @ASSESSMENTPLANBEGIN @ Assessment: Encounter Diagnoses  Name Primary?  Marland Kitchen LFTs abnormal Yes  . Abdominal bloating    Fluctuating mild transaminitis and alkaline phosphatase over years, recently just above normal.  Suspect subradiographic fatty liver, perhaps alcohol contribution though does not sound excessive.  Secondary iron overload pattern on last iron studies.  20% saturation not consistent with hemochromatosis.  Chronic stable prominence of the left upper abdominal wall.  Plan: Continue to follow LFTs about every 4 months.  His primary care provider has been good about forwarding these labs to me as well. Next colonoscopy in 2025 See me in a year  Total time 20 minutes, over half spent face-to-face with patient in counseling and coordination of care.   Wayne Webb

## 2019-08-28 NOTE — Patient Instructions (Signed)
If you are age 55 or older, your body mass index should be between 23-30. Your Body mass index is 29.64 kg/m. If this is out of the aforementioned range listed, please consider follow up with your Primary Care Provider.  If you are age 64 or younger, your body mass index should be between 19-25. Your Body mass index is 29.64 kg/m. If this is out of the aformentioned range listed, please consider follow up with your Primary Care Provider.   Follow up in 1 year.   It was a pleasure to see you today!  Dr. Loletha Carrow

## 2019-08-29 ENCOUNTER — Other Ambulatory Visit: Payer: Self-pay

## 2019-08-31 ENCOUNTER — Encounter: Payer: Self-pay | Admitting: Endocrinology

## 2019-08-31 ENCOUNTER — Other Ambulatory Visit: Payer: Self-pay

## 2019-08-31 ENCOUNTER — Ambulatory Visit (INDEPENDENT_AMBULATORY_CARE_PROVIDER_SITE_OTHER): Payer: 59 | Admitting: Endocrinology

## 2019-08-31 VITALS — BP 120/78 | HR 86 | Ht 71.75 in | Wt 219.8 lb

## 2019-08-31 DIAGNOSIS — N1831 Chronic kidney disease, stage 3a: Secondary | ICD-10-CM

## 2019-08-31 DIAGNOSIS — E1121 Type 2 diabetes mellitus with diabetic nephropathy: Secondary | ICD-10-CM

## 2019-08-31 DIAGNOSIS — E119 Type 2 diabetes mellitus without complications: Secondary | ICD-10-CM | POA: Diagnosis not present

## 2019-08-31 DIAGNOSIS — Z794 Long term (current) use of insulin: Secondary | ICD-10-CM | POA: Diagnosis not present

## 2019-08-31 DIAGNOSIS — E1122 Type 2 diabetes mellitus with diabetic chronic kidney disease: Secondary | ICD-10-CM

## 2019-08-31 LAB — POCT GLYCOSYLATED HEMOGLOBIN (HGB A1C): Hemoglobin A1C: 8.4 % — AB (ref 4.0–5.6)

## 2019-08-31 MED ORDER — TRESIBA FLEXTOUCH 200 UNIT/ML ~~LOC~~ SOPN: 85.0000 [IU] | PEN_INJECTOR | Freq: Every day | SUBCUTANEOUS | 3 refills | Status: DC

## 2019-08-31 NOTE — Patient Instructions (Addendum)
Please increase the Tresiba to 85 units per day.   check your blood sugar twice a day.  vary the time of day when you check, between before the 3 meals, and at bedtime.  also check if you have symptoms of your blood sugar being too high or too low.  please keep a record of the readings and bring it to your next appointment here (or you can bring the meter itself).  You can write it on any piece of paper.  please call us sooner if your blood sugar goes below 70, or if you have a lot of readings over 200.   Please come back for a follow-up appointment in 2 months.

## 2019-08-31 NOTE — Progress Notes (Signed)
Subjective:    Patient ID: Wayne Webb, male    DOB: [birth year], 53 y.o.   MRN: [id number]  HPI Pt returns for f/u of diabetes mellitus: DM type: Insulin-requiring type 2 Dx'ed: [id number] Complications: retinopathy and renal insuff.   Therapy: insulin since 2011.  DKA: never Severe hypoglycemia: never.  Pancreatitis: never Other: he declines multiple daily injections; he works 3rd shift. Interval history: He says he never misses the insulin.  no cbg record, but states cbg's vary from 90-285.  pt states he feels well in general.   Past Medical History:  Diagnosis Date  . ALLERGIC RHINITIS 03/15/2007  . CRI (chronic renal insufficiency)    CRI, creat 1.4 , renal u/s 04-2011 neg  . DIABETES MELLITUS, TYPE II 03/15/2007  . Diplopia 12/24/2009  . Elevated LFTs   . ERECTILE DYSFUNCTION 03/15/2007  . GOITER, MULTINODULAR 03/21/2010  . HYPERLIPIDEMIA 03/15/2007    Past Surgical History:  Procedure Laterality Date  . LASIK Left 10/2012    Social History   Socioeconomic History  . Marital status: Married    Spouse name: Not on file  . Number of children: 1  . Years of education: Not on file  . Highest education level: Not on file  Occupational History  . 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 Financial resource strain: Not on file  . Food insecurity    Worry: Not on file    Inability: Not on file  . Transportation needs    Medical: Not on file    Non-medical: Not on file  Tobacco Use  . Smoking status: Former Smoker    Packs/day: 0.50    Years: 10.00    Pack years: 5.00    Types: Cigarettes    Quit date: 10/26/1993    Years since quitting: 25.8  . Smokeless tobacco: Never Used  Substance and Sexual Activity  . Alcohol use: No    Comment:    . Drug use: No  . Sexual activity: Not on file  Lifestyle  . Physical activity    Days per week: Not on file    Minutes per session: Not on file  . Stress: Not on file  Relationships  . Social  Herbalist on phone: Not on file    Gets together: Not on file    Attends religious service: Not on file    Active member of club or organization: Not on file    Attends meetings of clubs or organizations: Not on file    Relationship status: Not on file  . Intimate partner violence    Fear of current or ex partner: Not on file    Emotionally abused: Not on file    Physically abused: Not on file    Forced sexual activity: Not on file  Other Topics Concern  . Not on file  Social History Narrative   Divorced. Remarried : pt , wife     Current Outpatient Medications on File Prior to Visit  Medication Sig Dispense Refill  . aspirin EC 81 MG tablet Take 81 mg by mouth daily.    Marland Kitchen azelastine (ASTELIN) 0.1 % nasal spray Place 2 sprays into both nostrils at bedtime as needed for rhinitis or allergies. 90 mL 3  . glucose blood (ACCU-CHEK AVIVA PLUS) test strip 1 each by Other route 2 (two) times daily. And lancets 2/day 200 each 3  . Insulin Pen Needle (B-D UF III MINI  PEN NEEDLES) 31G X 5 MM MISC 1 each by Does not apply route daily. Use to inject insulin daily; E11.9 100 each 3  . losartan (COZAAR) 25 MG tablet Take 2 tablets (50 mg total) by mouth daily. 180 tablet 1  . OVER THE COUNTER MEDICATION Take 4 capsules by mouth daily. Nitric oxide supplement    . OVER THE COUNTER MEDICATION Take 1 application by mouth daily as needed. Pre workout vitamin supplement mix     . Protein POWD Take 1 scoop by mouth 2 (two) times daily.    . sildenafil (VIAGRA) 100 MG tablet Take 0.5-1 tablets (50-100 mg total) by mouth daily as needed for erectile dysfunction. 10 tablet 11   No current facility-administered medications on file prior to visit.     No Known Allergies  Family History  Problem Relation Age of Onset  . Heart disease Mother        CABG-onset in her early 54's  . Diabetes Mother 37       had DM from her 61's  . Hypertension Mother   . Prostate cancer Father 44       dx in  his 47s  . Stroke Neg Hx   . Colon cancer Neg Hx     BP 120/78 (BP Location: Right Arm, Patient Position: Sitting, Cuff Size: Large)   Pulse 86   Ht 5' 11.75" (1.822 m)   Wt 219 lb 12.8 oz (99.7 kg)   SpO2 95%   BMI 30.02 kg/m    Review of Systems He denies hypoglycemia    Objective:   Physical Exam VITAL SIGNS:  See vs page GENERAL: no distress Pulses: dorsalis pedis intact bilat.   MSK: no deformity of the feet CV: no leg edema Skin:  no ulcer on the feet.  normal color and temp on the feet.  Neuro: sensation is intact to touch on the feet.   Lab Results  Component Value Date   HGBA1C 8.4 (A) 08/31/2019       Assessment & Plan:  Insulin-requiring type 2 DM: he needs increased rx occupational status: he needs a very slow time-release qd insulin  Patient Instructions  Please increase the Tresiba to 85 units per day.   check your blood sugar twice a day.  vary the time of day when you check, between before the 3 meals, and at bedtime.  also check if you have symptoms of your blood sugar being too high or too low.  please keep a record of the readings and bring it to your next appointment here (or you can bring the meter itself).  You can write it on any piece of paper.  please call us sooner if your blood sugar goes below 70, or if you have a lot of readings over 200.   Please come back for a follow-up appointment in 2 months.

## 2019-10-02 ENCOUNTER — Other Ambulatory Visit: Payer: Self-pay

## 2019-10-03 ENCOUNTER — Encounter: Payer: 59 | Admitting: Internal Medicine

## 2019-10-03 ENCOUNTER — Ambulatory Visit (INDEPENDENT_AMBULATORY_CARE_PROVIDER_SITE_OTHER): Payer: 59 | Admitting: Internal Medicine

## 2019-10-03 ENCOUNTER — Other Ambulatory Visit: Payer: Self-pay

## 2019-10-03 ENCOUNTER — Encounter: Payer: Self-pay | Admitting: Internal Medicine

## 2019-10-03 VITALS — BP 154/86 | HR 86 | Temp 98.3°F | Resp 16 | Ht 72.0 in | Wt 216.4 lb

## 2019-10-03 DIAGNOSIS — Z23 Encounter for immunization: Secondary | ICD-10-CM | POA: Diagnosis not present

## 2019-10-03 DIAGNOSIS — E1122 Type 2 diabetes mellitus with diabetic chronic kidney disease: Secondary | ICD-10-CM

## 2019-10-03 DIAGNOSIS — N1831 Chronic kidney disease, stage 3a: Secondary | ICD-10-CM | POA: Diagnosis not present

## 2019-10-03 DIAGNOSIS — E1121 Type 2 diabetes mellitus with diabetic nephropathy: Secondary | ICD-10-CM

## 2019-10-03 DIAGNOSIS — Z794 Long term (current) use of insulin: Secondary | ICD-10-CM | POA: Diagnosis not present

## 2019-10-03 DIAGNOSIS — Z0000 Encounter for general adult medical examination without abnormal findings: Secondary | ICD-10-CM | POA: Diagnosis not present

## 2019-10-03 LAB — COMPREHENSIVE METABOLIC PANEL
ALT: 49 U/L (ref 0–53)
AST: 41 U/L — ABNORMAL HIGH (ref 0–37)
Albumin: 4.3 g/dL (ref 3.5–5.2)
Alkaline Phosphatase: 155 U/L — ABNORMAL HIGH (ref 39–117)
BUN: 40 mg/dL — ABNORMAL HIGH (ref 6–23)
CO2: 27 mEq/L (ref 19–32)
Calcium: 10.2 mg/dL (ref 8.4–10.5)
Chloride: 97 mEq/L (ref 96–112)
Creatinine, Ser: 1.9 mg/dL — ABNORMAL HIGH (ref 0.40–1.50)
GFR: 44.65 mL/min — ABNORMAL LOW (ref >60.00)
Glucose, Bld: 454 mg/dL — ABNORMAL HIGH (ref 70–99)
Potassium: 4.7 mEq/L (ref 3.5–5.1)
Sodium: 134 mEq/L — ABNORMAL LOW (ref 135–145)
Total Bilirubin: 0.6 mg/dL (ref 0.2–1.2)
Total Protein: 7.8 g/dL (ref 6.0–8.3)

## 2019-10-03 LAB — LIPID PANEL
Cholesterol: 192 mg/dL (ref 0–200)
HDL: 38.9 mg/dL — ABNORMAL LOW (ref >39.00)
NonHDL: 153.58
Total CHOL/HDL Ratio: 5
Triglycerides: 373 mg/dL — ABNORMAL HIGH (ref 0.0–149.0)
VLDL: 74.6 mg/dL — ABNORMAL HIGH (ref 0.0–40.0)

## 2019-10-03 LAB — CBC WITH DIFFERENTIAL/PLATELET
Basophils Absolute: 0 10*3/uL (ref 0.0–0.1)
Basophils Relative: 0.9 % (ref 0.0–3.0)
Eosinophils Absolute: 0.2 10*3/uL (ref 0.0–0.7)
Eosinophils Relative: 4.8 % (ref 0.0–5.0)
HCT: 39.3 % (ref 39.0–52.0)
Hemoglobin: 13.4 g/dL (ref 13.0–17.0)
Lymphocytes Relative: 17 % (ref 12.0–46.0)
Lymphs Abs: 0.8 10*3/uL (ref 0.7–4.0)
MCHC: 34.1 g/dL (ref 30.0–36.0)
MCV: 85.6 fl (ref 78.0–100.0)
Monocytes Absolute: 0.9 10*3/uL (ref 0.1–1.0)
Monocytes Relative: 19.8 % — ABNORMAL HIGH (ref 3.0–12.0)
Neutro Abs: 2.6 10*3/uL (ref 1.4–7.7)
Neutrophils Relative %: 57.5 % (ref 43.0–77.0)
Platelets: 188 10*3/uL (ref 150.0–400.0)
RBC: 4.58 Mil/uL (ref 4.22–5.81)
RDW: 12.3 % (ref 11.5–15.5)
WBC: 4.5 10*3/uL (ref 4.0–10.5)

## 2019-10-03 LAB — LDL CHOLESTEROL, DIRECT: Direct LDL: 101 mg/dL

## 2019-10-03 LAB — PSA: PSA: 2.05 ng/mL (ref 0.10–4.00)

## 2019-10-03 NOTE — Patient Instructions (Addendum)
GO TO THE LAB : Get the blood work     GO TO THE FRONT DESK Schedule a nurse visit in 2 to 3 months for your second Shingrix  Schedule labs to be done in 6 months (check liver test) unless done  at endocrinology  Schedule your next physical in 1 year    Check the  blood pressure weekly BP GOAL is between 110/65 and  135/85. If it is consistently higher or lower, let me know

## 2019-10-03 NOTE — Progress Notes (Signed)
Subjective:    Patient ID: Wayne Webb, male    DOB: 1966-05-24, 53 y.o.   MRN: [id number]  DOS:  10/03/2019 Type of visit - description: CPX Since the last office visit is doing well. He admits to being less active due to the gyms being closed, he notices his stamina has decreased   but the respiratory symptoms are at baseline, denies DOE today.  BP Readings from Last 3 Encounters:  10/03/19 (!) 154/86  08/31/19 120/78  08/28/19 130/70      Review of Systems  Other than above, a 14 point review of systems is negative    Past Medical History:  Diagnosis Date  . ALLERGIC RHINITIS 03/15/2007  . CRI (chronic renal insufficiency)    CRI, creat 1.4 , renal u/s 04-2011 neg  . DIABETES MELLITUS, TYPE II 03/15/2007  . Diplopia 12/24/2009  . Elevated LFTs   . ERECTILE DYSFUNCTION 03/15/2007  . GOITER, MULTINODULAR 03/21/2010  . HYPERLIPIDEMIA 03/15/2007    Past Surgical History:  Procedure Laterality Date  . LASIK Left 10/2012    Social History   Socioeconomic History  . Marital status: Married    Spouse name: Not on file  . Number of children: 1  . Years of education: Not on file  . Highest education level: Not on file  Occupational History  . 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 Financial resource strain: Not on file  . Food insecurity    Worry: Not on file    Inability: Not on file  . Transportation needs    Medical: Not on file    Non-medical: Not on file  Tobacco Use  . Smoking status: Former Smoker    Packs/day: 0.50    Years: 10.00    Pack years: 5.00    Types: Cigarettes    Quit date: 10/26/1993    Years since quitting: 25.9  . Smokeless tobacco: Never Used  Substance and Sexual Activity  . Alcohol use: No    Comment:    . Drug use: No  . Sexual activity: Not on file  Lifestyle  . Physical activity    Days per week: Not on file    Minutes per session: Not on file  . Stress: Not on  file  Relationships  . Social Herbalist on phone: Not on file    Gets together: Not on file    Attends religious service: Not on file    Active member of club or organization: Not on file    Attends meetings of clubs or organizations: Not on file    Relationship status: Not on file  . Intimate partner violence    Fear of current or ex partner: Not on file    Emotionally abused: Not on file    Physically abused: Not on file    Forced sexual activity: Not on file  Other Topics Concern  . Not on file  Social History Narrative   Divorced. Remarried : pt , wife      Family History  Problem Relation Age of Onset  . Heart disease Mother        CABG-onset in her early 76's  . Diabetes Mother 29       had DM from her 48's  . Hypertension Mother   . Prostate cancer Father 85       dx in his 13s  . Stroke Neg Hx   .  Colon cancer Neg Hx      Allergies as of 10/03/2019   No Known Allergies     Medication List       Accurate as of October 03, 2019 11:59 PM. If you have any questions, ask your nurse or doctor.        Accu-Chek Aviva Plus test strip Generic drug: glucose blood 1 each by Other route 2 (two) times daily. And lancets 2/day   aspirin EC 81 MG tablet Take 81 mg by mouth daily.   azelastine 0.1 % nasal spray Commonly known as: ASTELIN Place 2 sprays into both nostrils at bedtime as needed for rhinitis or allergies.   Insulin Pen Needle 31G X 5 MM Misc Commonly known as: B-D UF III MINI PEN NEEDLES 1 each by Does not apply route daily. Use to inject insulin daily; E11.9   losartan 25 MG tablet Commonly known as: COZAAR Take 2 tablets (50 mg total) by mouth daily.   OVER THE COUNTER MEDICATION Take 4 capsules by mouth daily. Nitric oxide supplement   OVER THE COUNTER MEDICATION Take 1 application by mouth daily as needed. Pre workout vitamin supplement mix   Protein Powd Take 1 scoop by mouth 2 (two) times daily.   sildenafil 100 MG tablet  Commonly known as: Viagra Take 0.5-1 tablets (50-100 mg total) by mouth daily as needed for erectile dysfunction.   Tyler Aas FlexTouch 200 UNIT/ML Sopn Generic drug: Insulin Degludec Inject 86 Units into the skin daily. And pen needles 1/day           Objective:   Physical Exam BP (!) 154/86 (BP Location: Left Arm, Patient Position: Sitting, Cuff Size: Normal)   Pulse 86   Temp 98.3 F (36.8 C) (Temporal)   Resp 16   Ht 6' (1.829 m)   Wt 216 lb 6 oz (98.1 kg)   SpO2 97%   BMI 29.35 kg/m  General: Well developed, NAD, BMI noted Neck: No  thyromegaly  HEENT:  Normocephalic . Face symmetric, atraumatic Lungs:  Dry crackles at bases Normal respiratory effort, no intercostal retractions, no accessory muscle use. Heart: RRR,  no murmur.  No pretibial edema bilaterally  Abdomen:  Not distended, soft, non-tender. No rebound or rigidity.   Skin: Exposed areas without rash. Not pale. Not jaundice DRE: Exam somewhat limited, normal sphincter tone, no stools, prostate slightly enlarged but not tender.  Neurologic:  alert & oriented X3.  Speech normal, gait appropriate for age and unassisted Strength symmetric and appropriate for age.  Psych: Cognition and judgment appear intact.  Cooperative with normal attention span and concentration.  Behavior appropriate. No anxious or depressed appearing.     Assessment     Assessment DM - dr Loanne Drilling Hyperlipidemia (h/o increased LFTs w/ simva 2013) Goiter -- per dr Loanne Drilling, last [place] 06/2016 stable CKD - renal US (-) 2012 ; not f/u  by nephrology as off 09/2018 ED Elevated LFTs Ultrasound 2015 negative,  hepatitis serologies (-) 01-2015: ferritin (-) previously slt elevated; wnl  transferrin saturation, ANA, ceruloplasmin, anti smooth muscles ab. Alpha 1 antitrypsin. Saw GI 10/2017: U/S wnl, labs borderline abnormal was RX a liver Bx Pulmonary: --DOE --Chronic Cough, better after ACEi changed to Diovan 03-2015---Dr. Wert --Burnout  sarcoidosis : abnormal CXR, Confirmed by CT, likely due to burnout sarcoidosis.   PLAN: DM: Per Endo Hyperlipidemia: Diet controlled, recheck today. History of goiter: Exam is normal today CKD: Labs today Elevated BP today: Typically normal, recommend ambulatory BPs Increase LFTs: Last  visit with GI few days ago, fluctuating mild transaminitis and alkaline phosphatase increase probably d/t  subradiographic  fatty liver, perhaps alcohol contribution. Recommend to recheck LFTs periodically. Abnormal chest x-ray: Likely due to burned-out sarcoidosis, crackles at bases but essentially no symptoms RTC 6 months for labs CPX in 1 year   This visit occurred during the SARS-CoV-2 public health emergency.  Safety protocols were in place, including screening questions prior to the visit, additional usage of staff PPE, and extensive cleaning of exam room while observing appropriate contact time as indicated for disinfecting solutions.    ==  DM: Per endocrinology Hyperlipidemia: History of increased LFTs with simvastatin.  Checking labs CKD: Stable over time, checking labs.  Recommend to avoid NSAIDs and excessive protein supplements Increased LFTs: Last visit with GI October   2019, will forward them the results of today labs. RTC 1 year

## 2019-10-03 NOTE — Progress Notes (Signed)
Pre visit review using our clinic review tool, if applicable. No additional management support is needed unless otherwise documented below in the visit note. 

## 2019-10-03 NOTE — Assessment & Plan Note (Addendum)
-  Td 2016;  pneumonia shot 23: 2015;  prevnar : 2015; shingrix discussed, #1 provided today.  Return in 2 to 3 months. Had a flu shot    - (+) FH prostate cancer , DRE with minimal enlargement of the prostate gland, check a PSA   - CCS: Colonoscopy, 02/2014, 1 polyp, next 10 years - diet: is ok, unable to exercise, gyms closed  - labs CMP, CBC, PSA, FLP

## 2019-10-04 NOTE — Assessment & Plan Note (Signed)
DM: Per Endo Hyperlipidemia: Diet controlled, recheck today. History of goiter: Exam is normal today CKD: Labs today Elevated BP today: Typically normal, recommend ambulatory BPs Increase LFTs: Last visit with GI few days ago, fluctuating mild transaminitis and alkaline phosphatase increase probably d/t  subradiographic  fatty liver, perhaps alcohol contribution. Recommend to recheck LFTs periodically. Abnormal chest x-ray: Likely due to burned-out sarcoidosis, crackles at bases but essentially no symptoms RTC 6 months for labs CPX in 1 year

## 2019-10-05 MED ORDER — EZETIMIBE 10 MG PO TABS: 10.0000 mg | ORAL_TABLET | Freq: Every day | ORAL | 3 refills | Status: DC

## 2019-10-05 NOTE — Addendum Note (Signed)
Addended by: Damita Dunnings D on: 10/05/2019 12:44 PM   Modules accepted: Orders

## 2019-10-31 ENCOUNTER — Other Ambulatory Visit: Payer: Self-pay

## 2019-11-02 ENCOUNTER — Other Ambulatory Visit: Payer: Self-pay

## 2019-11-02 ENCOUNTER — Ambulatory Visit (INDEPENDENT_AMBULATORY_CARE_PROVIDER_SITE_OTHER): Payer: 59 | Admitting: Endocrinology

## 2019-11-02 ENCOUNTER — Encounter: Payer: Self-pay | Admitting: Endocrinology

## 2019-11-02 VITALS — BP 118/68 | HR 81 | Ht 72.0 in | Wt 218.6 lb

## 2019-11-02 DIAGNOSIS — E11319 Type 2 diabetes mellitus with unspecified diabetic retinopathy without macular edema: Secondary | ICD-10-CM

## 2019-11-02 DIAGNOSIS — E119 Type 2 diabetes mellitus without complications: Secondary | ICD-10-CM

## 2019-11-02 DIAGNOSIS — E1122 Type 2 diabetes mellitus with diabetic chronic kidney disease: Secondary | ICD-10-CM

## 2019-11-02 DIAGNOSIS — Z794 Long term (current) use of insulin: Secondary | ICD-10-CM

## 2019-11-02 LAB — POCT GLYCOSYLATED HEMOGLOBIN (HGB A1C): Hemoglobin A1C: 10 % — AB (ref 4.0–5.6)

## 2019-11-02 MED ORDER — TOUJEO MAX SOLOSTAR 300 UNIT/ML ~~LOC~~ SOPN: 100.0000 [IU] | PEN_INJECTOR | SUBCUTANEOUS | 11 refills | Status: DC

## 2019-11-02 NOTE — Progress Notes (Signed)
Subjective:    Patient ID: Wayne Webb, male    DOB: 12/19/1965, 54 y.o.   MRN: [id number]  HPI Pt returns for f/u of diabetes mellitus: DM type: Insulin-requiring type 2 Dx'ed: 123XX123 Complications: retinopathy and renal insuff.   Therapy: insulin since 2011.  DKA: never Severe hypoglycemia: never.  Pancreatitis: never Other: he declines multiple daily injections; he works 3rd shift. Interval history: He says he still never misses the insulin.  no cbg record, but states cbg's vary from 75-190.  pt states he feels well in general.  He says his diet is poor.  Pt says ins no longer cover tresiba.   Past Medical History:  Diagnosis Date  . ALLERGIC RHINITIS 03/15/2007  . CRI (chronic renal insufficiency)    CRI, creat 1.4 , renal u/s 04-2011 neg  . DIABETES MELLITUS, TYPE II 03/15/2007  . Diplopia 12/24/2009  . Elevated LFTs   . ERECTILE DYSFUNCTION 03/15/2007  . GOITER, MULTINODULAR 03/21/2010  . HYPERLIPIDEMIA 03/15/2007    Past Surgical History:  Procedure Laterality Date  . LASIK Left 10/2012    Social History   Socioeconomic History  . Marital status: Married    Spouse name: Not on file  . Number of children: 1  . Years of education: Not on file  . Highest education level: Not on file  Occupational History  . 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 Smoking status: Former Smoker    Packs/day: 0.50    Years: 10.00    Pack years: 5.00    Types: Cigarettes    Quit date: 10/26/1993    Years since quitting: 26.0  . Smokeless tobacco: Never Used  Substance and Sexual Activity  . Alcohol use: No    Comment:    . Drug use: No  . Sexual activity: Not on file  Other Topics Concern  . Not on file  Social History Narrative   Divorced. Remarried : pt , wife    Social Determinants of Radio broadcast assistant Strain:   . Difficulty of Paying Living Expenses: Not on file  Food Insecurity:   . Worried About  Charity fundraiser in the Last Year: Not on file  . Ran Out of Food in the Last Year: Not on file  Transportation Needs:   . Lack of Transportation (Medical): Not on file  . Lack of Transportation (Non-Medical): Not on file  Physical Activity:   . Days of Exercise per Week: Not on file  . Minutes of Exercise per Session: Not on file  Stress:   . Feeling of Stress : Not on file  Social Connections:   . Frequency of Communication with Friends and Family: Not on file  . Frequency of Social Gatherings with Friends and Family: Not on file  . Attends Religious Services: Not on file  . Active Member of Clubs or Organizations: Not on file  . Attends Archivist Meetings: Not on file  . Marital Status: Not on file  Intimate Partner Violence:   . Fear of Current or Ex-Partner: Not on file  . Emotionally Abused: Not on file  . Physically Abused: Not on file  . Sexually Abused: Not on file    Current Outpatient Medications on File Prior to Visit  Medication Sig Dispense Refill  . aspirin EC 81 MG tablet Take 81 mg by mouth daily.    Marland Kitchen azelastine (ASTELIN) 0.1 % nasal  spray Place 2 sprays into both nostrils at bedtime as needed for rhinitis or allergies. 90 mL 3  . ezetimibe (ZETIA) 10 MG tablet Take 1 tablet (10 mg total) by mouth daily. 90 tablet 3  . glucose blood (ACCU-CHEK AVIVA PLUS) test strip 1 each by Other route 2 (two) times daily. And lancets 2/day 200 each 3  . Insulin Pen Needle (B-D UF III MINI PEN NEEDLES) 31G X 5 MM MISC 1 each by Does not apply route daily. Use to inject insulin daily; E11.9 100 each 3  . losartan (COZAAR) 25 MG tablet Take 2 tablets (50 mg total) by mouth daily. 180 tablet 1  . OVER THE COUNTER MEDICATION Take 4 capsules by mouth daily. Nitric oxide supplement    . OVER THE COUNTER MEDICATION Take 1 application by mouth daily as needed. Pre workout vitamin supplement mix     . Protein POWD Take 1 scoop by mouth 2 (two) times daily.    . sildenafil  (VIAGRA) 100 MG tablet Take 0.5-1 tablets (50-100 mg total) by mouth daily as needed for erectile dysfunction. 10 tablet 11   No current facility-administered medications on file prior to visit.    No Known Allergies  Family History  Problem Relation Age of Onset  . Heart disease Mother        CABG-onset in her early 64's  . Diabetes Mother 15       had DM from her 66's  . Hypertension Mother   . Prostate cancer Father 21       dx in his 33s  . Stroke Neg Hx   . Colon cancer Neg Hx     BP 118/68 (BP Location: Left Arm, Patient Position: Sitting, Cuff Size: Large)   Pulse 81   Ht 6' (1.829 m)   Wt 218 lb 9.6 oz (99.2 kg)   SpO2 94%   BMI 29.65 kg/m    Review of Systems He denies hypoglycemia    Objective:   Physical Exam VITAL SIGNS:  See vs page GENERAL: no distress Pulses: dorsalis pedis intact bilat.   MSK: no deformity of the feet CV: no leg edema Skin:  no ulcer on the feet.  normal color and temp on the feet.  Neuro: sensation is intact to touch on the feet.    Lab Results  Component Value Date   HGBA1C 10.0 (A) 11/02/2019       Assessment & Plan:  Insulin-requiring type 2 DM, with DR: worse Occupational state: for 3rd shift work, tresiba is preferred, but ins no longer covered.  Patient Instructions  Please increase the Tresiba to 100 units per day.   When you run out, change to "toujeo," 100 units each morning. The first day, take just 50 units.  check your blood sugar twice a day.  vary the time of day when you check, between before the 3 meals, and at bedtime.  also check if you have symptoms of your blood sugar being too high or too low.  please keep a record of the readings and bring it to your next appointment here (or you can bring the meter itself).  You can write it on any piece of paper.  please call us sooner if your blood sugar goes below 70, or if you have a lot of readings over 200.   Please come back for a follow-up appointment in 2 months.

## 2019-11-02 NOTE — Patient Instructions (Addendum)
Please increase the Tresiba to 100 units per day.   When you run out, change to "toujeo," 100 units each morning. The first day, take just 50 units.  check your blood sugar twice a day.  vary the time of day when you check, between before the 3 meals, and at bedtime.  also check if you have symptoms of your blood sugar being too high or too low.  please keep a record of the readings and bring it to your next appointment here (or you can bring the meter itself).  You can write it on any piece of paper.  please call us sooner if your blood sugar goes below 70, or if you have a lot of readings over 200.   Please come back for a follow-up appointment in 2 months.

## 2019-12-25 ENCOUNTER — Other Ambulatory Visit: Payer: Self-pay

## 2019-12-25 DIAGNOSIS — Z794 Long term (current) use of insulin: Secondary | ICD-10-CM

## 2019-12-25 DIAGNOSIS — N1831 Chronic kidney disease, stage 3a: Secondary | ICD-10-CM

## 2019-12-25 DIAGNOSIS — E1122 Type 2 diabetes mellitus with diabetic chronic kidney disease: Secondary | ICD-10-CM

## 2019-12-25 MED ORDER — ONETOUCH VERIO VI STRP: 1.0000 | ORAL_STRIP | Freq: Two times a day (BID) | 0 refills | Status: DC

## 2019-12-25 MED ORDER — ONETOUCH DELICA LANCETS 30G MISC: 1.0000 | Freq: Two times a day (BID) | 0 refills | Status: DC

## 2019-12-25 MED ORDER — ONETOUCH VERIO W/DEVICE KIT: 1.0000 | PACK | Freq: Two times a day (BID) | 0 refills | Status: DC

## 2020-01-03 ENCOUNTER — Other Ambulatory Visit: Payer: Self-pay

## 2020-01-03 ENCOUNTER — Ambulatory Visit (INDEPENDENT_AMBULATORY_CARE_PROVIDER_SITE_OTHER): Payer: 59 | Admitting: *Deleted

## 2020-01-03 DIAGNOSIS — Z23 Encounter for immunization: Secondary | ICD-10-CM | POA: Diagnosis not present

## 2020-01-03 NOTE — Progress Notes (Signed)
Patient here for shingles vaccine.  Vaccine given in left deltoid and patient tolerated well. 

## 2020-01-04 ENCOUNTER — Ambulatory Visit (INDEPENDENT_AMBULATORY_CARE_PROVIDER_SITE_OTHER): Payer: 59 | Admitting: Endocrinology

## 2020-01-04 ENCOUNTER — Encounter: Payer: Self-pay | Admitting: Endocrinology

## 2020-01-04 VITALS — BP 124/88 | HR 98 | Ht 72.0 in | Wt 213.4 lb

## 2020-01-04 DIAGNOSIS — Z794 Long term (current) use of insulin: Secondary | ICD-10-CM | POA: Diagnosis not present

## 2020-01-04 DIAGNOSIS — E1121 Type 2 diabetes mellitus with diabetic nephropathy: Secondary | ICD-10-CM

## 2020-01-04 DIAGNOSIS — N1831 Chronic kidney disease, stage 3a: Secondary | ICD-10-CM | POA: Diagnosis not present

## 2020-01-04 DIAGNOSIS — E1122 Type 2 diabetes mellitus with diabetic chronic kidney disease: Secondary | ICD-10-CM

## 2020-01-04 LAB — POCT GLYCOSYLATED HEMOGLOBIN (HGB A1C): Hemoglobin A1C: 9.3 % — AB (ref 4.0–5.6)

## 2020-01-04 NOTE — Patient Instructions (Addendum)
Please increase the insulin to 110 units per day.   When you run out, change back to [facility], at the same amount.  The first day, take just 50 units.   On this type of insulin schedule, you should eat meals on a regular schedule.  If a meal is missed or significantly delayed, your blood sugar could go low.  check your blood sugar twice a day.  vary the time of day when you check, between before the 3 meals, and at bedtime.  also check if you have symptoms of your blood sugar being too high or too low.  please keep a record of the readings and bring it to your next appointment here (or you can bring the meter itself).  You can write it on any piece of paper.  please call us sooner if your blood sugar goes below 70, or if you have a lot of readings over 200.   Please come back for a follow-up appointment in 2 months.

## 2020-01-04 NOTE — Progress Notes (Signed)
Subjective:    Patient ID: Wayne Webb, male    DOB: 1966/09/02, 54 y.o.   MRN: [id number]  HPI Pt returns for f/u of diabetes mellitus: DM type: Insulin-requiring type 2 Dx'ed: 6378 Complications: retinopathy and renal insuff.   Therapy: insulin since 2011.  DKA: never Severe hypoglycemia: never.  Pancreatitis: never SDOH: he works 3rd shift--Tresiba is therefore best, but ins has declined.   Other: he declines multiple daily injections.   Interval history: He says he still never misses the insulin.  no cbg record, but states cbg's vary from 90-200's.  pt states he feels well in general.  He is still using up [place].  Past Medical History:  Diagnosis Date  . ALLERGIC RHINITIS 03/15/2007  . CRI (chronic renal insufficiency)    CRI, creat 1.4 , renal u/s 04-2011 neg  . DIABETES MELLITUS, TYPE II 03/15/2007  . Diplopia 12/24/2009  . Elevated LFTs   . ERECTILE DYSFUNCTION 03/15/2007  . GOITER, MULTINODULAR 03/21/2010  . HYPERLIPIDEMIA 03/15/2007    Past Surgical History:  Procedure Laterality Date  . LASIK Left 10/2012    Social History   Socioeconomic History  . Marital status: Married    Spouse name: Not on file  . Number of children: 1  . Years of education: Not on file  . Highest education level: Not on file  Occupational History  . 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 Smoking status: Former Smoker    Packs/day: 0.50    Years: 10.00    Pack years: 5.00    Types: Cigarettes    Quit date: 10/26/1993    Years since quitting: 26.2  . Smokeless tobacco: Never Used  Substance and Sexual Activity  . Alcohol use: No    Comment:    . Drug use: No  . Sexual activity: Not on file  Other Topics Concern  . Not on file  Social History Narrative   Divorced. Remarried : pt , wife    Social Determinants of Radio broadcast assistant Strain:   . Difficulty of Paying Living Expenses:   Food Insecurity:   .  Worried About Charity fundraiser in the Last Year:   . Arboriculturist in the Last Year:   Transportation Needs:   . Film/video editor (Medical):   Marland Kitchen Lack of Transportation (Non-Medical):   Physical Activity:   . Days of Exercise per Week:   . Minutes of Exercise per Session:   Stress:   . Feeling of Stress :   Social Connections:   . Frequency of Communication with Friends and Family:   . Frequency of Social Gatherings with Friends and Family:   . Attends Religious Services:   . Active Member of Clubs or Organizations:   . Attends Archivist Meetings:   Marland Kitchen Marital Status:   Intimate Partner Violence:   . Fear of Current or Ex-Partner:   . Emotionally Abused:   Marland Kitchen Physically Abused:   . Sexually Abused:     Current Outpatient Medications on File Prior to Visit  Medication Sig Dispense Refill  . aspirin EC 81 MG tablet Take 81 mg by mouth daily.    Marland Kitchen azelastine (ASTELIN) 0.1 % nasal spray Place 2 sprays into both nostrils at bedtime as needed for rhinitis or allergies. 90 mL 3  . Blood Glucose Monitoring Suppl (ONETOUCH VERIO) w/Device KIT 1 each by Does not  apply route 2 (two) times daily. E11.9 1 kit 0  . ezetimibe (ZETIA) 10 MG tablet Take 1 tablet (10 mg total) by mouth daily. 90 tablet 3  . glucose blood (ONETOUCH VERIO) test strip 1 each by Other route in the morning and at bedtime. E11.9 200 each 0  . Insulin Glargine, 2 Unit Dial, (TOUJEO MAX SOLOSTAR) 300 UNIT/ML SOPN Inject 100 Units into the skin every morning. And pen needles 1/day 10 pen 11  . Insulin Pen Needle (B-D UF III MINI PEN NEEDLES) 31G X 5 MM MISC 1 each by Does not apply route daily. Use to inject insulin daily; E11.9 100 each 3  . losartan (COZAAR) 25 MG tablet Take 2 tablets (50 mg total) by mouth daily. 180 tablet 1  . OneTouch Delica Lancets 08Q MISC 1 each by Does not apply route in the morning and at bedtime. E11.9 200 each 0  . OVER THE COUNTER MEDICATION Take 4 capsules by mouth daily.  Nitric oxide supplement    . OVER THE COUNTER MEDICATION Take 1 application by mouth daily as needed. Pre workout vitamin supplement mix     . Protein POWD Take 1 scoop by mouth 2 (two) times daily.    . sildenafil (VIAGRA) 100 MG tablet Take 0.5-1 tablets (50-100 mg total) by mouth daily as needed for erectile dysfunction. 10 tablet 11   No current facility-administered medications on file prior to visit.    No Known Allergies  Family History  Problem Relation Age of Onset  . Heart disease Mother        CABG-onset in her early 32's  . Diabetes Mother 27       had DM from her 58's  . Hypertension Mother   . Prostate cancer Father 25       dx in his 29s  . Stroke Neg Hx   . Colon cancer Neg Hx     BP 124/88 (BP Location: Left Arm, Patient Position: Sitting, Cuff Size: Large)   Pulse 98   Ht 6' (1.829 m)   Wt 213 lb 6.4 oz (96.8 kg)   SpO2 96%   BMI 28.94 kg/m    Review of Systems He denies hypoglycemia    Objective:   Physical Exam VITAL SIGNS:  See vs page GENERAL: no distress Pulses: dorsalis pedis intact bilat.   MSK: no deformity of the feet CV: no leg edema Skin:  no ulcer on the feet.  normal color and temp on the feet. Neuro: sensation is intact to touch on the feet  Lab Results  Component Value Date   HGBA1C 9.3 (A) 01/04/2020       Assessment & Plan:  Insulin-requiring type 2 DM, with DR: he needs increased rx SDOH: tresiba is best in view of 3rd shift work, but ins has declined.  He may need adjustment when he runs out of this   Patient Instructions  Please increase the insulin to 110 units per day.   When you run out, change back to [facility], at the same amount.  The first day, take just 50 units.   On this type of insulin schedule, you should eat meals on a regular schedule.  If a meal is missed or significantly delayed, your blood sugar could go low.  check your blood sugar twice a day.  vary the time of day when you check, between before the 3  meals, and at bedtime.  also check if you have symptoms of your blood sugar  being too high or too low.  please keep a record of the readings and bring it to your next appointment here (or you can bring the meter itself).  You can write it on any piece of paper.  please call us sooner if your blood sugar goes below 70, or if you have a lot of readings over 200.   Please come back for a follow-up appointment in 2 months.

## 2020-02-02 ENCOUNTER — Other Ambulatory Visit: Payer: Self-pay | Admitting: Internal Medicine

## 2020-03-01 ENCOUNTER — Other Ambulatory Visit: Payer: Self-pay

## 2020-03-01 ENCOUNTER — Other Ambulatory Visit: Payer: Self-pay | Admitting: Endocrinology

## 2020-03-01 DIAGNOSIS — N183 Chronic kidney disease, stage 3 unspecified: Secondary | ICD-10-CM

## 2020-03-01 DIAGNOSIS — Z794 Long term (current) use of insulin: Secondary | ICD-10-CM

## 2020-03-01 DIAGNOSIS — N1831 Chronic kidney disease, stage 3a: Secondary | ICD-10-CM

## 2020-03-01 DIAGNOSIS — E1122 Type 2 diabetes mellitus with diabetic chronic kidney disease: Secondary | ICD-10-CM

## 2020-03-01 MED ORDER — BD PEN NEEDLE MINI U/F 31G X 5 MM MISC: 1.0000 | Freq: Every day | 0 refills | Status: DC

## 2020-03-01 MED ORDER — BD PEN NEEDLE MINI U/F 31G X 5 MM MISC: 1.0000 | Freq: Every day | 3 refills | Status: DC

## 2020-03-03 ENCOUNTER — Other Ambulatory Visit: Payer: Self-pay | Admitting: Endocrinology

## 2020-03-03 DIAGNOSIS — E1122 Type 2 diabetes mellitus with diabetic chronic kidney disease: Secondary | ICD-10-CM

## 2020-03-03 DIAGNOSIS — Z794 Long term (current) use of insulin: Secondary | ICD-10-CM

## 2020-03-05 ENCOUNTER — Other Ambulatory Visit: Payer: Self-pay

## 2020-03-07 ENCOUNTER — Other Ambulatory Visit: Payer: Self-pay

## 2020-03-07 ENCOUNTER — Ambulatory Visit (INDEPENDENT_AMBULATORY_CARE_PROVIDER_SITE_OTHER): Payer: 59 | Admitting: Endocrinology

## 2020-03-07 ENCOUNTER — Encounter: Payer: Self-pay | Admitting: Endocrinology

## 2020-03-07 VITALS — BP 138/88 | HR 96 | Ht 72.0 in | Wt 220.0 lb

## 2020-03-07 DIAGNOSIS — E1122 Type 2 diabetes mellitus with diabetic chronic kidney disease: Secondary | ICD-10-CM

## 2020-03-07 DIAGNOSIS — E1121 Type 2 diabetes mellitus with diabetic nephropathy: Secondary | ICD-10-CM

## 2020-03-07 DIAGNOSIS — N1831 Chronic kidney disease, stage 3a: Secondary | ICD-10-CM | POA: Diagnosis not present

## 2020-03-07 DIAGNOSIS — Z794 Long term (current) use of insulin: Secondary | ICD-10-CM

## 2020-03-07 LAB — POCT GLYCOSYLATED HEMOGLOBIN (HGB A1C): Hemoglobin A1C: 9.8 % — AB (ref 4.0–5.6)

## 2020-03-07 MED ORDER — TOUJEO MAX SOLOSTAR 300 UNIT/ML ~~LOC~~ SOPN: 115.0000 [IU] | PEN_INJECTOR | SUBCUTANEOUS | 11 refills | Status: DC

## 2020-03-07 NOTE — Progress Notes (Signed)
Subjective:    Patient ID: Wayne Webb, male    DOB: 1966/04/23, 54 y.o.   MRN: [id number]  HPI Pt returns for f/u of diabetes mellitus: DM type: Insulin-requiring type 2 Dx'ed: 8185 Complications: DR and CRI.   Therapy: insulin since 2011.  DKA: never Severe hypoglycemia: never.  Pancreatitis: never SDOH: he works 3rd shift--Tresiba is therefore best, but ins has declined to continue.   Other: he declines multiple daily injections.   Interval history: He says he still never misses the insulin.  He takes 105 units/d.  He seldom checks cbg's.  When he does check, it is in the 100's.  pt states he feels well in general.   Past Medical History:  Diagnosis Date  . ALLERGIC RHINITIS 03/15/2007  . CRI (chronic renal insufficiency)    CRI, creat 1.4 , renal u/s 04-2011 neg  . DIABETES MELLITUS, TYPE II 03/15/2007  . Diplopia 12/24/2009  . Elevated LFTs   . ERECTILE DYSFUNCTION 03/15/2007  . GOITER, MULTINODULAR 03/21/2010  . HYPERLIPIDEMIA 03/15/2007    Past Surgical History:  Procedure Laterality Date  . LASIK Left 10/2012    Social History   Socioeconomic History  . Marital status: Married    Spouse name: Not on file  . Number of children: 1  . Years of education: Not on file  . Highest education level: Not on file  Occupational History  . 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 Smoking status: Former Smoker    Packs/day: 0.50    Years: 10.00    Pack years: 5.00    Types: Cigarettes    Quit date: 10/26/1993    Years since quitting: 26.3  . Smokeless tobacco: Never Used  Substance and Sexual Activity  . Alcohol use: No    Comment:    . Drug use: No  . Sexual activity: Not on file  Other Topics Concern  . Not on file  Social History Narrative   Divorced. Remarried : pt , wife    Social Determinants of Radio broadcast assistant Strain:   . Difficulty of Paying Living Expenses:   Food Insecurity:   .  Worried About Charity fundraiser in the Last Year:   . Arboriculturist in the Last Year:   Transportation Needs:   . Film/video editor (Medical):   Marland Kitchen Lack of Transportation (Non-Medical):   Physical Activity:   . Days of Exercise per Week:   . Minutes of Exercise per Session:   Stress:   . Feeling of Stress :   Social Connections:   . Frequency of Communication with Friends and Family:   . Frequency of Social Gatherings with Friends and Family:   . Attends Religious Services:   . Active Member of Clubs or Organizations:   . Attends Archivist Meetings:   Marland Kitchen Marital Status:   Intimate Partner Violence:   . Fear of Current or Ex-Partner:   . Emotionally Abused:   Marland Kitchen Physically Abused:   . Sexually Abused:     Current Outpatient Medications on File Prior to Visit  Medication Sig Dispense Refill  . aspirin EC 81 MG tablet Take 81 mg by mouth daily.    Marland Kitchen azelastine (ASTELIN) 0.1 % nasal spray Place 2 sprays into both nostrils at bedtime as needed for rhinitis or allergies. 90 mL 3  . Blood Glucose Monitoring Suppl (ONETOUCH VERIO) w/Device KIT  1 each by Does not apply route 2 (two) times daily. E11.9 1 kit 0  . ezetimibe (ZETIA) 10 MG tablet Take 1 tablet (10 mg total) by mouth daily. 90 tablet 3  . Insulin Pen Needle (B-D UF III MINI PEN NEEDLES) 31G X 5 MM MISC 1 each by Does not apply route daily. Use to inject insulin daily; E11.9 100 each 0  . Lancets (ONETOUCH DELICA PLUS [id number]) MISC TEST IN THE MORNING AND AT  BEDTIME 200 each 3  . losartan (COZAAR) 25 MG tablet Take 2 tablets (50 mg total) by mouth daily. 180 tablet 2  . ONETOUCH VERIO test strip TEST IN THE MORNING AND AT  BEDTIME 200 strip 3  . OVER THE COUNTER MEDICATION Take 4 capsules by mouth daily. Nitric oxide supplement    . OVER THE COUNTER MEDICATION Take 1 application by mouth daily as needed. Pre workout vitamin supplement mix     . Protein POWD Take 1 scoop by mouth 2 (two) times daily.    .  sildenafil (VIAGRA) 100 MG tablet Take 0.5-1 tablets (50-100 mg total) by mouth daily as needed for erectile dysfunction. 10 tablet 11   No current facility-administered medications on file prior to visit.    No Known Allergies  Family History  Problem Relation Age of Onset  . Heart disease Mother        CABG-onset in her early 39's  . Diabetes Mother 63       had DM from her 41's  . Hypertension Mother   . Prostate cancer Father 23       dx in his 22s  . Stroke Neg Hx   . Colon cancer Neg Hx     BP 138/88   Pulse 96   Ht 6' (1.829 m)   Wt 220 lb (99.8 kg)   SpO2 94%   BMI 29.84 kg/m    Review of Systems He denies hypoglycemia.      Objective:   Physical Exam VITAL SIGNS:  See vs page GENERAL: no distress Pulses: dorsalis pedis intact bilat.   MSK: no deformity of the feet CV: 1+ bilat leg edema Skin:  no ulcer on the feet.  normal color and temp on the feet.  Neuro: sensation is intact to touch on the feet.    Lab Results  Component Value Date   HGBA1C 9.8 (A) 03/07/2020   Lab Results  Component Value Date   CREATININE 1.90 (H) 10/03/2019   BUN 40 (H) 10/03/2019   NA 134 (L) 10/03/2019   K 4.7 10/03/2019   CL 97 10/03/2019   CO2 27 10/03/2019        Assessment & Plan:  Insulin-requiring type 2 DM, with CRI: worse Noncompliance with cbg recording: we'll have to titrate insulin based on A1c.  Patient Instructions  Please increase the insulin to 115 units per day.   On this type of insulin schedule, you should eat meals on a regular schedule.  If a meal is missed or significantly delayed, your blood sugar could go low.  check your blood sugar twice a day.  vary the time of day when you check, between before the 3 meals, and at bedtime.  also check if you have symptoms of your blood sugar being too high or too low.  please keep a record of the readings and bring it to your next appointment here (or you can bring the meter itself).  You can write it on any  piece  of paper.  please call us sooner if your blood sugar goes below 70, or if you have a lot of readings over 200.   Please come back for a follow-up appointment in 2 months.

## 2020-03-07 NOTE — Patient Instructions (Addendum)
Please increase the insulin to 115 units per day.   On this type of insulin schedule, you should eat meals on a regular schedule.  If a meal is missed or significantly delayed, your blood sugar could go low.  check your blood sugar twice a day.  vary the time of day when you check, between before the 3 meals, and at bedtime.  also check if you have symptoms of your blood sugar being too high or too low.  please keep a record of the readings and bring it to your next appointment here (or you can bring the meter itself).  You can write it on any piece of paper.  please call us sooner if your blood sugar goes below 70, or if you have a lot of readings over 200.   Please come back for a follow-up appointment in 2 months.

## 2020-04-02 ENCOUNTER — Other Ambulatory Visit (INDEPENDENT_AMBULATORY_CARE_PROVIDER_SITE_OTHER): Payer: 59

## 2020-04-02 ENCOUNTER — Other Ambulatory Visit: Payer: Self-pay

## 2020-04-02 DIAGNOSIS — R7989 Other specified abnormal findings of blood chemistry: Secondary | ICD-10-CM

## 2020-04-02 DIAGNOSIS — I10 Essential (primary) hypertension: Secondary | ICD-10-CM

## 2020-04-02 LAB — LDL CHOLESTEROL, DIRECT: Direct LDL: 78 mg/dL

## 2020-04-02 LAB — HEPATIC FUNCTION PANEL
ALT: 69 U/L — ABNORMAL HIGH (ref 0–53)
AST: 48 U/L — ABNORMAL HIGH (ref 0–37)
Albumin: 4.5 g/dL (ref 3.5–5.2)
Alkaline Phosphatase: 117 U/L (ref 39–117)
Bilirubin, Direct: 0.1 mg/dL (ref 0.0–0.3)
Total Bilirubin: 0.6 mg/dL (ref 0.2–1.2)
Total Protein: 7.9 g/dL (ref 6.0–8.3)

## 2020-04-02 LAB — BASIC METABOLIC PANEL
BUN: 42 mg/dL — ABNORMAL HIGH (ref 6–23)
CO2: 27 mEq/L (ref 19–32)
Calcium: 9.9 mg/dL (ref 8.4–10.5)
Chloride: 100 mEq/L (ref 96–112)
Creatinine, Ser: 1.75 mg/dL — ABNORMAL HIGH (ref 0.40–1.50)
GFR: 49.01 mL/min — ABNORMAL LOW (ref >60.00)
Glucose, Bld: 305 mg/dL — ABNORMAL HIGH (ref 70–99)
Potassium: 4.1 mEq/L (ref 3.5–5.1)
Sodium: 134 mEq/L — ABNORMAL LOW (ref 135–145)

## 2020-04-02 LAB — LIPID PANEL
Cholesterol: 165 mg/dL (ref 0–200)
HDL: 36.8 mg/dL — ABNORMAL LOW (ref >39.00)
NonHDL: 128.21
Total CHOL/HDL Ratio: 4
Triglycerides: 398 mg/dL — ABNORMAL HIGH (ref 0.0–149.0)
VLDL: 79.6 mg/dL — ABNORMAL HIGH (ref 0.0–40.0)

## 2020-04-08 MED ORDER — EZETIMIBE 10 MG PO TABS: 10.0000 mg | ORAL_TABLET | Freq: Every day | ORAL | 3 refills | Status: DC

## 2020-04-08 NOTE — Addendum Note (Signed)
Addended byDamita Dunnings D on: 04/08/2020 01:26 PM   Modules accepted: Orders

## 2020-05-02 ENCOUNTER — Other Ambulatory Visit: Payer: Self-pay | Admitting: Endocrinology

## 2020-05-02 DIAGNOSIS — N183 Chronic kidney disease, stage 3 unspecified: Secondary | ICD-10-CM

## 2020-05-02 DIAGNOSIS — Z794 Long term (current) use of insulin: Secondary | ICD-10-CM

## 2020-05-09 ENCOUNTER — Ambulatory Visit: Payer: 59 | Admitting: Endocrinology

## 2020-08-06 ENCOUNTER — Ambulatory Visit (INDEPENDENT_AMBULATORY_CARE_PROVIDER_SITE_OTHER): Payer: 59 | Admitting: Endocrinology

## 2020-08-06 ENCOUNTER — Other Ambulatory Visit: Payer: Self-pay

## 2020-08-06 VITALS — BP 138/84 | HR 86 | Ht 72.0 in | Wt 217.8 lb

## 2020-08-06 DIAGNOSIS — E1122 Type 2 diabetes mellitus with diabetic chronic kidney disease: Secondary | ICD-10-CM | POA: Diagnosis not present

## 2020-08-06 DIAGNOSIS — N1831 Chronic kidney disease, stage 3a: Secondary | ICD-10-CM

## 2020-08-06 DIAGNOSIS — Z23 Encounter for immunization: Secondary | ICD-10-CM

## 2020-08-06 DIAGNOSIS — Z794 Long term (current) use of insulin: Secondary | ICD-10-CM | POA: Diagnosis not present

## 2020-08-06 LAB — POCT GLYCOSYLATED HEMOGLOBIN (HGB A1C): Hemoglobin A1C: 6.5 % — AB (ref 4.0–5.6)

## 2020-08-06 MED ORDER — TOUJEO MAX SOLOSTAR 300 UNIT/ML ~~LOC~~ SOPN: 110.0000 [IU] | PEN_INJECTOR | SUBCUTANEOUS | Status: DC

## 2020-08-06 NOTE — Patient Instructions (Addendum)
Please reduce the insulin to 110 units per day.   On this type of insulin schedule, you should eat meals on a regular schedule.  If a meal is missed or significantly delayed, your blood sugar could go low.  check your blood sugar twice a day.  vary the time of day when you check, between before the 3 meals, and at bedtime.  also check if you have symptoms of your blood sugar being too high or too low.  please keep a record of the readings and bring it to your next appointment here (or you can bring the meter itself).  You can write it on any piece of paper.  please call us sooner if your blood sugar goes below 70, or if you have a lot of readings over 200.   Please come back for a follow-up appointment in 3 months.

## 2020-08-06 NOTE — Progress Notes (Signed)
Subjective:    Patient ID: Wayne Webb, male    DOB: 12-09-1965, 54 y.o.   MRN: [id number]  HPI Pt returns for f/u of diabetes mellitus: DM type: Insulin-requiring type 2 Dx'ed: 4196 Complications: DR and CRI.   Therapy: insulin since 2011.  DKA: never Severe hypoglycemia: never.  Pancreatitis: never SDOH: he works 3rd shift--Tresiba is therefore preferred, but ins has declined to continue.   Other: he declines multiple daily injections.   Interval history: He says he never misses the insulin.  pt states cbg's vary from 90-170, but he seldom checks cbg.  pt states he feels well in general.  He seldom has hypoglycemia, and these episodes are mild.   Past Medical History:  Diagnosis Date  . ALLERGIC RHINITIS 03/15/2007  . CRI (chronic renal insufficiency)    CRI, creat 1.4 , renal u/s 04-2011 neg  . DIABETES MELLITUS, TYPE II 03/15/2007  . Diplopia 12/24/2009  . Elevated LFTs   . ERECTILE DYSFUNCTION 03/15/2007  . GOITER, MULTINODULAR 03/21/2010  . HYPERLIPIDEMIA 03/15/2007    Past Surgical History:  Procedure Laterality Date  . LASIK Left 10/2012    Social History   Socioeconomic History  . Marital status: Married    Spouse name: Not on file  . Number of children: 1  . Years of education: Not on file  . Highest education level: Not on file  Occupational History  . 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 Smoking status: Former Smoker    Packs/day: 0.50    Years: 10.00    Pack years: 5.00    Types: Cigarettes    Quit date: 10/26/1993    Years since quitting: 26.8  . Smokeless tobacco: Never Used  Vaping Use  . Vaping Use: Never used  Substance and Sexual Activity  . Alcohol use: No    Comment:    . Drug use: No  . Sexual activity: Not on file  Other Topics Concern  . Not on file  Social History Narrative   Divorced. Remarried : pt , wife    Social Determinants of Radio broadcast assistant Strain:    . Difficulty of Paying Living Expenses: Not on file  Food Insecurity:   . Worried About Charity fundraiser in the Last Year: Not on file  . Ran Out of Food in the Last Year: Not on file  Transportation Needs:   . Lack of Transportation (Medical): Not on file  . Lack of Transportation (Non-Medical): Not on file  Physical Activity:   . Days of Exercise per Week: Not on file  . Minutes of Exercise per Session: Not on file  Stress:   . Feeling of Stress : Not on file  Social Connections:   . Frequency of Communication with Friends and Family: Not on file  . Frequency of Social Gatherings with Friends and Family: Not on file  . Attends Religious Services: Not on file  . Active Member of Clubs or Organizations: Not on file  . Attends Archivist Meetings: Not on file  . Marital Status: Not on file  Intimate Partner Violence:   . Fear of Current or Ex-Partner: Not on file  . Emotionally Abused: Not on file  . Physically Abused: Not on file  . Sexually Abused: Not on file    Current Outpatient Medications on File Prior to Visit  Medication Sig Dispense Refill  . aspirin EC  81 MG tablet Take 81 mg by mouth daily.    Marland Kitchen azelastine (ASTELIN) 0.1 % nasal spray Place 2 sprays into both nostrils at bedtime as needed for rhinitis or allergies. 90 mL 3  . B-D UF III MINI PEN NEEDLES 31G X 5 MM MISC USE AS DIRECTED EVERY DAY 100 each 3  . Blood Glucose Monitoring Suppl (ONETOUCH VERIO) w/Device KIT 1 each by Does not apply route 2 (two) times daily. E11.9 1 kit 0  . ezetimibe (ZETIA) 10 MG tablet Take 1 tablet (10 mg total) by mouth daily. 90 tablet 3  . Lancets (ONETOUCH DELICA PLUS [id number]) MISC TEST IN THE MORNING AND AT  BEDTIME 200 each 3  . losartan (COZAAR) 25 MG tablet Take 2 tablets (50 mg total) by mouth daily. 180 tablet 2  . ONETOUCH VERIO test strip TEST IN THE MORNING AND AT  BEDTIME 200 strip 3  . OVER THE COUNTER MEDICATION Take 4 capsules by mouth daily. Nitric oxide  supplement    . OVER THE COUNTER MEDICATION Take 1 application by mouth daily as needed. Pre workout vitamin supplement mix     . Protein POWD Take 1 scoop by mouth 2 (two) times daily.     No current facility-administered medications on file prior to visit.    No Known Allergies  Family History  Problem Relation Age of Onset  . Heart disease Mother        CABG-onset in her early 7's  . Diabetes Mother 90       had DM from her 29's  . Hypertension Mother   . Prostate cancer Father 1       dx in his 27s  . Stroke Neg Hx   . Colon cancer Neg Hx     BP 138/84 (BP Location: Right Arm)   Pulse 86   Ht 6' (1.829 m)   Wt 217 lb 12.8 oz (98.8 kg)   SpO2 95%   BMI 29.54 kg/m    Review of Systems Denies LOC    Objective:   Physical Exam VITAL SIGNS:  See vs page GENERAL: no distress Pulses: dorsalis pedis intact bilat.   MSK: no deformity of the feet CV: no leg edema Skin:  no ulcer on the feet.  normal color and temp on the feet. Neuro: sensation is intact to touch on the feet.    Lab Results  Component Value Date   HGBA1C 6.5 (A) 08/06/2020       Assessment & Plan:  Insulin-requiring type 2 DM, with CRI Hypoglycemia, due to insulin: this limits aggressiveness of glycemic control.   Patient Instructions  Please reduce the insulin to 110 units per day.   On this type of insulin schedule, you should eat meals on a regular schedule.  If a meal is missed or significantly delayed, your blood sugar could go low.  check your blood sugar twice a day.  vary the time of day when you check, between before the 3 meals, and at bedtime.  also check if you have symptoms of your blood sugar being too high or too low.  please keep a record of the readings and bring it to your next appointment here (or you can bring the meter itself).  You can write it on any piece of paper.  please call us sooner if your blood sugar goes below 70, or if you have a lot of readings over 200.   Please  come back for a follow-up appointment  in 3 months.

## 2020-08-09 ENCOUNTER — Other Ambulatory Visit: Payer: Self-pay | Admitting: Endocrinology

## 2020-08-13 ENCOUNTER — Other Ambulatory Visit: Payer: Self-pay | Admitting: Endocrinology

## 2020-08-16 ENCOUNTER — Ambulatory Visit (INDEPENDENT_AMBULATORY_CARE_PROVIDER_SITE_OTHER): Payer: 59 | Admitting: Internal Medicine

## 2020-08-16 ENCOUNTER — Other Ambulatory Visit: Payer: Self-pay

## 2020-08-16 ENCOUNTER — Encounter: Payer: Self-pay | Admitting: Internal Medicine

## 2020-08-16 ENCOUNTER — Ambulatory Visit (HOSPITAL_BASED_OUTPATIENT_CLINIC_OR_DEPARTMENT_OTHER)
Admission: RE | Admit: 2020-08-16 | Discharge: 2020-08-16 | Disposition: A | Discharge status: Home / Self Care | Payer: 59 | Source: Ambulatory Visit | Attending: Internal Medicine | Admitting: Internal Medicine

## 2020-08-16 ENCOUNTER — Telehealth: Payer: Self-pay

## 2020-08-16 VITALS — BP 146/87 | HR 85 | Temp 98.1°F | Resp 16 | Ht 72.0 in | Wt 213.1 lb

## 2020-08-16 DIAGNOSIS — R0781 Pleurodynia: Secondary | ICD-10-CM

## 2020-08-16 DIAGNOSIS — Z09 Encounter for follow-up examination after completed treatment for conditions other than malignant neoplasm: Secondary | ICD-10-CM | POA: Diagnosis not present

## 2020-08-16 MED ORDER — LIDOCAINE 5 % EX PTCH: 1.0000 | MEDICATED_PATCH | CUTANEOUS | 0 refills | Status: DC

## 2020-08-16 NOTE — Telephone Encounter (Signed)
PA initiated via Covermymeds; KEY: [id number]. Awaiting determination.

## 2020-08-16 NOTE — Progress Notes (Signed)
Pre visit review using our clinic review tool, if applicable. No additional management support is needed unless otherwise documented below in the visit note. 

## 2020-08-16 NOTE — Patient Instructions (Addendum)
Per our records you are due for an eye exam. Please contact your eye doctor to schedule an appointment. Please have them send copies of your office visit notes to [place]. Our fax number is [phone].   Go to the first floor and get your x-rays  For pain, use one lidocaine patch daily at the area of the pain.  If the patches are too expensive you may like to use over-the-counter cream called capsaicin.  If the pain is not gradually improving let me know.  Proceed with COVID shot booster at your convenience

## 2020-08-16 NOTE — Progress Notes (Signed)
Subjective:    Patient ID: Wayne Webb, male    DOB: [birth year], 54 y.o.   MRN: [id number]  DOS:  08/16/2020 Type of visit - description: Acute Having pain at the left anterolateral chest wall since April. Pain is random, may last 10 to 30 minutes. Slightly worse when he touches the area. I asked him if he had a rash there few months ago and he said no.  Also has a similar discomfort for the left side of a epigastric area for even longer than April.  Review of Systems No fever chills No weight loss No rash or injury No difficulty breathing No SS chest pain with exertion No cough No airplane travel recently.  Past Medical History:  Diagnosis Date  . ALLERGIC RHINITIS 03/15/2007  . CRI (chronic renal insufficiency)    CRI, creat 1.4 , renal u/s 04-2011 neg  . DIABETES MELLITUS, TYPE II 03/15/2007  . Diplopia 12/24/2009  . Elevated LFTs   . ERECTILE DYSFUNCTION 03/15/2007  . GOITER, MULTINODULAR 03/21/2010  . HYPERLIPIDEMIA 03/15/2007    Past Surgical History:  Procedure Laterality Date  . LASIK Left 10/2012    Allergies as of 08/16/2020   No Known Allergies     Medication List       Accurate as of August 16, 2020  2:26 PM. If you have any questions, ask your nurse or doctor.        aspirin EC 81 MG tablet Take 81 mg by mouth daily.   azelastine 0.1 % nasal spray Commonly known as: ASTELIN Place 2 sprays into both nostrils at bedtime as needed for rhinitis or allergies.   B-D UF III MINI PEN NEEDLES 31G X 5 MM Misc Generic drug: Insulin Pen Needle USE AS DIRECTED EVERY DAY   ezetimibe 10 MG tablet Commonly known as: Zetia Take 1 tablet (10 mg total) by mouth daily.   losartan 25 MG tablet Commonly known as: COZAAR Take 2 tablets (50 mg total) by mouth daily.   OneTouch Delica Plus [id number] Misc TEST IN THE MORNING AND AT  BEDTIME   OneTouch Verio test strip Generic drug: glucose blood TEST IN THE MORNING AND AT  BEDTIME   OneTouch Verio  w/Device Kit 1 each by Does not apply route 2 (two) times daily. E11.9   OVER THE COUNTER MEDICATION Take 4 capsules by mouth daily. Nitric oxide supplement   OVER THE COUNTER MEDICATION Take 1 application by mouth daily as needed. Pre workout vitamin supplement mix   Protein Powd Take 1 scoop by mouth 2 (two) times daily.   sildenafil 100 MG tablet Commonly known as: VIAGRA TAKE 0.5-1 TABLETS (50-100 MG TOTAL) BY MOUTH DAILY AS NEEDED FOR ERECTILE DYSFUNCTION.   Toujeo Max SoloStar 300 UNIT/ML Solostar Pen Generic drug: insulin glargine (2 Unit Dial) Inject 110 Units into the skin every morning. And pen needles 1/day          Objective:   Physical Exam Abdominal:    Musculoskeletal:       Arms:    BP (!) 146/87 (BP Location: Left Arm, Patient Position: Sitting, Cuff Size: Normal)   Pulse 85   Temp 98.1 F (36.7 C) (Oral)   Resp 16   Ht 6' (1.829 m)   Wt 213 lb 2 oz (96.7 kg)   SpO2 95%   BMI 28.90 kg/m   General:   Well developed, NAD, BMI noted.  HEENT:  Normocephalic . Face symmetric, atraumatic Lungs:  CTA B Normal respiratory  effort, no intercostal retractions, no accessory muscle use. Heart: RRR,  no murmur.  Abdomen:  Not distended, soft, non-tender. No rebound or rigidity.   Skin: No rash anywhere in the abdomen or chest. MSK: Not tender at the thoracic spine Lower extremities: no pretibial edema bilaterally  Neurologic:  alert & oriented X3.  Speech normal, gait appropriate for age and unassisted Psych--  Cognition and judgment appear intact.  Cooperative with normal attention span and concentration.  Behavior appropriate. No anxious or depressed appearing.     Assessment      Assessment DM - dr Loanne Drilling Hyperlipidemia (h/o increased LFTs w/ simva 2013) Goiter -- per dr Loanne Drilling, last [place] 06/2016 stable CKD - renal US (-) 2012 ; not f/u  by nephrology as off 09/2018 ED Elevated LFTs Ultrasound 2015 negative,  hepatitis serologies  (-) 01-2015: ferritin (-) previously slt elevated; wnl  transferrin saturation, ANA, ceruloplasmin, anti smooth muscles ab. Alpha 1 antitrypsin. Saw GI 10/2017: U/S wnl, labs borderline abnormal was RX a liver Bx Pulmonary: --DOE --Chronic Cough, better after ACEi changed to Diovan 03-2015---Dr. Wert --Burnout sarcoidosis : abnormal CXR, Confirmed by CT, likely due to burnout sarcoidosis.   PLAN: Chest pain: As described above, on clinical grounds suspect neuritis.  zoster sine herpete with postherpetic neuralgia?. Plan: Rib series, trial with lidocaine patches, if cost is an issue try capsaicin. If not better let me know, pain management?  This visit occurred during the SARS-CoV-2 public health emergency.  Safety protocols were in place, including screening questions prior to the visit, additional usage of staff PPE, and extensive cleaning of exam room while observing appropriate contact time as indicated for disinfecting solutions.

## 2020-08-16 NOTE — Telephone Encounter (Signed)
PA approved.  Request Reference Number: [id number]. LIDOCAINE PAD 5% is approved through 02/14/2021. Your patient may now fill this prescription and it will be covered.

## 2020-08-18 NOTE — Assessment & Plan Note (Signed)
Chest pain: As described above, on clinical grounds suspect neuritis.  zoster sine herpete with postherpetic neuralgia?. Plan: Rib series, trial with lidocaine patches, if cost is an issue try capsaicin. If not better let me know, pain management?

## 2020-08-21 ENCOUNTER — Ambulatory Visit: Payer: 59 | Admitting: Endocrinology

## 2020-09-02 ENCOUNTER — Other Ambulatory Visit: Payer: Self-pay | Admitting: Internal Medicine

## 2020-09-04 ENCOUNTER — Telehealth: Payer: Self-pay | Admitting: Endocrinology

## 2020-09-04 NOTE — Telephone Encounter (Signed)
Patient called re: PHARM told patient they have not received RX or RX Request from [facility] for sildenafil (VIAGRA) 100 MG tablet. Patient requests new RX for sildenafil (VIAGRA) 100 MG tablet be sent asap to   [id number] - [place], [place] Phone:  [phone]  Fax:  [phone]

## 2020-09-05 ENCOUNTER — Other Ambulatory Visit: Payer: Self-pay | Admitting: *Deleted

## 2020-09-05 DIAGNOSIS — N529 Male erectile dysfunction, unspecified: Secondary | ICD-10-CM

## 2020-09-05 MED ORDER — SILDENAFIL CITRATE 100 MG PO TABS: 50.0000 mg | ORAL_TABLET | Freq: Every day | ORAL | 19 refills | Status: DC | PRN

## 2020-09-05 NOTE — Telephone Encounter (Signed)
Re-sent Rx Viagra [facility]

## 2020-09-06 ENCOUNTER — Encounter: Payer: Self-pay | Admitting: Internal Medicine

## 2020-09-21 ENCOUNTER — Other Ambulatory Visit: Payer: Self-pay | Admitting: Internal Medicine

## 2020-10-02 LAB — HM DIABETES EYE EXAM

## 2020-10-03 ENCOUNTER — Ambulatory Visit (INDEPENDENT_AMBULATORY_CARE_PROVIDER_SITE_OTHER): Payer: 59 | Admitting: Internal Medicine

## 2020-10-03 ENCOUNTER — Encounter: Payer: Self-pay | Admitting: Internal Medicine

## 2020-10-03 ENCOUNTER — Other Ambulatory Visit: Payer: Self-pay

## 2020-10-03 VITALS — BP 162/88 | HR 79 | Temp 98.0°F | Resp 18 | Ht 72.0 in | Wt 218.0 lb

## 2020-10-03 DIAGNOSIS — Z0000 Encounter for general adult medical examination without abnormal findings: Secondary | ICD-10-CM

## 2020-10-03 DIAGNOSIS — I10 Essential (primary) hypertension: Secondary | ICD-10-CM

## 2020-10-03 LAB — COMPREHENSIVE METABOLIC PANEL
ALT: 57 U/L — ABNORMAL HIGH (ref 0–53)
AST: 47 U/L — ABNORMAL HIGH (ref 0–37)
Albumin: 4.4 g/dL (ref 3.5–5.2)
Alkaline Phosphatase: 119 U/L — ABNORMAL HIGH (ref 39–117)
BUN: 39 mg/dL — ABNORMAL HIGH (ref 6–23)
CO2: 30 mEq/L (ref 19–32)
Calcium: 11.1 mg/dL — ABNORMAL HIGH (ref 8.4–10.5)
Chloride: 99 mEq/L (ref 96–112)
Creatinine, Ser: 1.73 mg/dL — ABNORMAL HIGH (ref 0.40–1.50)
GFR: 43.55 mL/min — ABNORMAL LOW (ref >60.00)
Glucose, Bld: 306 mg/dL — ABNORMAL HIGH (ref 70–99)
Potassium: 4 mEq/L (ref 3.5–5.1)
Sodium: 137 mEq/L (ref 135–145)
Total Bilirubin: 0.9 mg/dL (ref 0.2–1.2)
Total Protein: 8.5 g/dL — ABNORMAL HIGH (ref 6.0–8.3)

## 2020-10-03 LAB — CBC WITH DIFFERENTIAL/PLATELET
Basophils Absolute: 0.1 10*3/uL (ref 0.0–0.1)
Basophils Relative: 1.2 % (ref 0.0–3.0)
Eosinophils Absolute: 0.2 10*3/uL (ref 0.0–0.7)
Eosinophils Relative: 5 % (ref 0.0–5.0)
HCT: 38 % — ABNORMAL LOW (ref 39.0–52.0)
Hemoglobin: 13 g/dL (ref 13.0–17.0)
Lymphocytes Relative: 18.3 % (ref 12.0–46.0)
Lymphs Abs: 0.8 10*3/uL (ref 0.7–4.0)
MCHC: 34.1 g/dL (ref 30.0–36.0)
MCV: 86 fl (ref 78.0–100.0)
Monocytes Absolute: 0.8 10*3/uL (ref 0.1–1.0)
Monocytes Relative: 19.6 % — ABNORMAL HIGH (ref 3.0–12.0)
Neutro Abs: 2.3 10*3/uL (ref 1.4–7.7)
Neutrophils Relative %: 55.9 % (ref 43.0–77.0)
Platelets: 198 10*3/uL (ref 150.0–400.0)
RBC: 4.42 Mil/uL (ref 4.22–5.81)
RDW: 12.9 % (ref 11.5–15.5)
WBC: 4.2 10*3/uL (ref 4.0–10.5)

## 2020-10-03 MED ORDER — AZELASTINE HCL 0.1 % NA SOLN: 2.0000 | Freq: Every evening | NASAL | 3 refills | Status: DC | PRN

## 2020-10-03 MED ORDER — EZETIMIBE 10 MG PO TABS
10.0000 mg | ORAL_TABLET | Freq: Every day | ORAL | 3 refills | Status: DC
Start: 2020-10-03 — End: 2021-11-03

## 2020-10-03 NOTE — Progress Notes (Signed)
Subjective:    Patient ID: Wayne Webb, male    DOB: Apr 06, 1966, 54 y.o.   MRN: [id number]  DOS:  10/03/2020 Type of visit - description: CPX Since the last office visit is doing well except for the chest pain. It is ongoing, he tried  the lidocaine patch a couple of times only, some help.  BP Readings from Last 3 Encounters:  10/03/20 (!) 162/88  08/16/20 (!) 146/87  08/06/20 138/84      Review of Systems No DOE currently, cough at baseline, typically triggered by postnasal dripping  Other than above, a 14 point review of systems is negative      Past Medical History:  Diagnosis Date  . ALLERGIC RHINITIS 03/15/2007  . CRI (chronic renal insufficiency)    CRI, creat 1.4 , renal u/s 04-2011 neg  . DIABETES MELLITUS, TYPE II 03/15/2007  . Diplopia 12/24/2009  . Elevated LFTs   . ERECTILE DYSFUNCTION 03/15/2007  . GOITER, MULTINODULAR 03/21/2010  . HYPERLIPIDEMIA 03/15/2007    Past Surgical History:  Procedure Laterality Date  . LASIK Left 10/2012    Allergies as of 10/03/2020   No Known Allergies     Medication List       Accurate as of October 03, 2020  3:52 PM. If you have any questions, ask your nurse or doctor.        aspirin EC 81 MG tablet Take 81 mg by mouth daily.   azelastine 0.1 % nasal spray Commonly known as: ASTELIN Place 2 sprays into both nostrils at bedtime as needed for rhinitis or allergies.   B-D UF III MINI PEN NEEDLES 31G X 5 MM Misc Generic drug: Insulin Pen Needle USE AS DIRECTED EVERY DAY   ezetimibe 10 MG tablet Commonly known as: Zetia Take 1 tablet (10 mg total) by mouth daily.   lidocaine 5 % Commonly known as: Lidoderm Place 1 patch onto the skin daily. Remove & Discard patch within 12 hours or as directed by MD   losartan 25 MG tablet Commonly known as: COZAAR TAKE 2 TABLETS BY MOUTH EVERY DAY   OneTouch Delica Plus [id number] Misc TEST IN THE MORNING AND AT  BEDTIME   OneTouch Verio test strip Generic drug:  glucose blood TEST IN THE MORNING AND AT  BEDTIME   OneTouch Verio w/Device Kit 1 each by Does not apply route 2 (two) times daily. E11.9   OVER THE COUNTER MEDICATION Take 4 capsules by mouth daily. Nitric oxide supplement   OVER THE COUNTER MEDICATION Take 1 application by mouth daily as needed. Pre workout vitamin supplement mix   Protein Powd Take 1 scoop by mouth 2 (two) times daily.   sildenafil 100 MG tablet Commonly known as: VIAGRA Take 0.5-1 tablets (50-100 mg total) by mouth daily as needed for erectile dysfunction.   Toujeo Max SoloStar 300 UNIT/ML Solostar Pen Generic drug: insulin glargine (2 Unit Dial) Inject 110 Units into the skin every morning. And pen needles 1/day          Objective:   Physical Exam BP (!) 162/88 (BP Location: Left Arm, Patient Position: Sitting, Cuff Size: Normal)   Pulse 79   Temp 98 F (36.7 C) (Oral)   Resp 18   Ht 6' (1.829 m)   Wt 218 lb (98.9 kg)   SpO2 94%   BMI 29.57 kg/m  General: Well developed, NAD, BMI noted Neck: No  thyromegaly  HEENT:  Normocephalic . Face symmetric, atraumatic Lungs:  CTA B  Normal respiratory effort, no intercostal retractions, no accessory muscle use. Heart: RRR,  no murmur.  Abdomen:  Not distended, soft, non-tender. No rebound or rigidity.   Lower extremities: no pretibial edema bilaterally  Skin: Exposed areas without rash. Not pale. Not jaundice Neurologic:  alert & oriented X3.  Speech normal, gait appropriate for age and unassisted Strength symmetric and appropriate for age.  Psych: Cognition and judgment appear intact.  Cooperative with normal attention span and concentration.  Behavior appropriate. No anxious or depressed appearing.     Assessment    Assessment DM - dr Loanne Drilling Hyperlipidemia (h/o increased LFTs w/ simva 2013) Goiter -- per dr Loanne Drilling, last [place] 06/2016 stable CKD - renal US (-) 2012 ; not f/u  by nephrology as off 09/2018 ED Elevated LFTs Ultrasound  2015 negative,  hepatitis serologies (-) 01-2015: ferritin (-) previously slt elevated; wnl  transferrin saturation, ANA, ceruloplasmin, anti smooth muscles ab. Alpha 1 antitrypsin. Saw GI 10/2017: U/S wnl, labs borderline abnormal was RX a liver Bx Pulmonary: --DOE --Chronic Cough, better after ACEi changed to Diovan 03-2015---Dr. Wert --Burnout sarcoidosis : abnormal CXR, Confirmed by CT, likely due to burnout sarcoidosis.   PLAN: Here for CPX DM: Per Endo Chest pain: See last visit, pain is ongoing, he would like some relief and also like to know more about the etiology.  Multiple options discussed including rx gabapentin.  At the end he decided to try the patch more consistently, if not better will refer him to either neurology or pain management. Hyperlipidemia: Out of zetia, send RF, recheck on RTC Chronic cough: Triggered by postnasal dripping, taking Astelin as needed with good results. Elevated BP: Upon arrival 154/92, no ambulatory BPs, recheck BP: 162/88.  We agreed on watch salt intake, monitor BPs and reassess in 4 months RTC 4 months   This visit occurred during the SARS-CoV-2 public health emergency.  Safety protocols were in place, including screening questions prior to the visit, additional usage of staff PPE, and extensive cleaning of exam room while observing appropriate contact time as indicated for disinfecting solutions.

## 2020-10-03 NOTE — Assessment & Plan Note (Signed)
Here for CPX DM: Per Endo Chest pain: See last visit, pain is ongoing, he would like some relief and also like to know more about the etiology.  Multiple options discussed including rx gabapentin.  At the end he decided to try the patch more consistently, if not better will refer him to either neurology or pain management. Hyperlipidemia: Out of zetia, send RF, recheck on RTC Chronic cough: Triggered by postnasal dripping, taking Astelin as needed with good results. Elevated BP: Upon arrival 154/92, no ambulatory BPs, recheck BP: 162/88.  We agreed on watch salt intake, monitor BPs and reassess in 4 months RTC 4 months

## 2020-10-03 NOTE — Assessment & Plan Note (Signed)
-  Td 2016;  pneumonia shot 23: 2015;  prevnar : 2015; shingrix completed -covid vax x 2, booster rec  - Had a flu shot    - (+) FH prostate cancer , DRE last year, minimal enlargement , PSA wnl - CCS: Colonoscopy, 02/2014, 1 polyp, next 10 years - Diet exercise: Doing well - labs reviewed, will get: CMP, CBC

## 2020-10-03 NOTE — Patient Instructions (Addendum)
Check the  blood pressure 2 or 3 times a month   BP GOAL is between 110/65 and  135/85. If it is consistently higher or lower, let me know       GO TO THE LAB : Get the blood work     [place], PLEASE SCHEDULE YOUR APPOINTMENTS Come back for   For a check up in 4 months  BP Readings from Last 3 Encounters:  10/03/20 (!) 162/88  08/16/20 (!) 146/87  08/06/20 138/84

## 2020-10-03 NOTE — Progress Notes (Signed)
Pre visit review using our clinic review tool, if applicable. No additional management support is needed unless otherwise documented below in the visit note. 

## 2020-10-12 ENCOUNTER — Other Ambulatory Visit: Payer: Self-pay | Admitting: Internal Medicine

## 2020-11-07 ENCOUNTER — Ambulatory Visit (INDEPENDENT_AMBULATORY_CARE_PROVIDER_SITE_OTHER): Payer: 59 | Admitting: Endocrinology

## 2020-11-07 ENCOUNTER — Encounter: Payer: Self-pay | Admitting: Endocrinology

## 2020-11-07 ENCOUNTER — Other Ambulatory Visit: Payer: Self-pay

## 2020-11-07 VITALS — BP 162/86 | HR 76 | Ht 72.0 in | Wt 220.0 lb

## 2020-11-07 DIAGNOSIS — E1122 Type 2 diabetes mellitus with diabetic chronic kidney disease: Secondary | ICD-10-CM | POA: Diagnosis not present

## 2020-11-07 DIAGNOSIS — N1831 Chronic kidney disease, stage 3a: Secondary | ICD-10-CM | POA: Diagnosis not present

## 2020-11-07 DIAGNOSIS — Z794 Long term (current) use of insulin: Secondary | ICD-10-CM

## 2020-11-07 LAB — POCT GLYCOSYLATED HEMOGLOBIN (HGB A1C): Hemoglobin A1C: 9.8 % — AB (ref 4.0–5.6)

## 2020-11-07 MED ORDER — TRULICITY 0.75 MG/0.5ML ~~LOC~~ SOAJ
0.7500 mg | SUBCUTANEOUS | 3 refills | Status: DC
Start: 2020-11-07 — End: 2021-03-12

## 2020-11-07 MED ORDER — TOUJEO MAX SOLOSTAR 300 UNIT/ML ~~LOC~~ SOPN
110.0000 [IU] | PEN_INJECTOR | SUBCUTANEOUS | 3 refills | Status: DC
Start: 2020-11-07 — End: 2020-11-15

## 2020-11-07 MED ORDER — TRULICITY 0.75 MG/0.5ML ~~LOC~~ SOAJ: 0.7500 mg | SUBCUTANEOUS | 3 refills | Status: DC

## 2020-11-07 NOTE — Progress Notes (Signed)
Subjective:    Patient ID: Wayne Webb, male    DOB: 1966/07/12, 55 y.o.   MRN: [id number]  HPI Pt returns for f/u of diabetes mellitus:  DM type: Insulin-requiring type 2 Dx'ed: 3235 Complications: DR and CRI.   Therapy: insulin since 2011.  DKA: never Severe hypoglycemia: never.  Pancreatitis: never SDOH: he works 3rd shift--Tresiba is therefore preferred, but ins has declined to continue.   Other: he declines multiple daily injections.   Interval history: He says he never misses the insulin, but he has not recently checked cbg.  pt states he feels well in general.   Past Medical History:  Diagnosis Date  . ALLERGIC RHINITIS 03/15/2007  . CRI (chronic renal insufficiency)    CRI, creat 1.4 , renal u/s 04-2011 neg  . DIABETES MELLITUS, TYPE II 03/15/2007  . Diplopia 12/24/2009  . Elevated LFTs   . ERECTILE DYSFUNCTION 03/15/2007  . GOITER, MULTINODULAR 03/21/2010  . HYPERLIPIDEMIA 03/15/2007    Past Surgical History:  Procedure Laterality Date  . LASIK Left 10/2012    Social History   Socioeconomic History  . Marital status: Legally Separated    Spouse name: Not on file  . Number of children: 1  . Years of education: Not on file  . Highest education level: Not on file  Occupational History  . 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 Smoking status: Former Smoker    Packs/day: 0.50    Years: 10.00    Pack years: 5.00    Types: Cigarettes    Quit date: 10/26/1993    Years since quitting: 27.0  . Smokeless tobacco: Never Used  Vaping Use  . Vaping Use: Never used  Substance and Sexual Activity  . Alcohol use: No    Comment:    . Drug use: No  . Sexual activity: Not on file  Other Topics Concern  . Not on file  Social History Narrative   Divorced. Remarried : pt , wife    Social Determinants of Radio broadcast assistant Strain: Not on file  Food Insecurity: Not on file  Transportation Needs: Not  on file  Physical Activity: Not on file  Stress: Not on file  Social Connections: Not on file  Intimate Partner Violence: Not on file    Current Outpatient Medications on File Prior to Visit  Medication Sig Dispense Refill  . aspirin EC 81 MG tablet Take 81 mg by mouth daily.    Marland Kitchen azelastine (ASTELIN) 0.1 % nasal spray Place 2 sprays into both nostrils at bedtime as needed for rhinitis or allergies. 90 mL 3  . B-D UF III MINI PEN NEEDLES 31G X 5 MM MISC USE AS DIRECTED EVERY DAY 100 each 3  . Blood Glucose Monitoring Suppl (ONETOUCH VERIO) w/Device KIT 1 each by Does not apply route 2 (two) times daily. E11.9 1 kit 0  . ezetimibe (ZETIA) 10 MG tablet Take 1 tablet (10 mg total) by mouth daily. 90 tablet 3  . Lancets (ONETOUCH DELICA PLUS [id number]) MISC TEST IN THE MORNING AND AT  BEDTIME 200 each 3  . lidocaine (LIDODERM) 5 % Place 1 patch onto the skin daily. Remove & Discard patch within 12 hours or as directed by MD 30 patch 0  . losartan (COZAAR) 25 MG tablet TAKE 2 TABLETS BY MOUTH EVERY DAY 60 tablet 71  . ONETOUCH VERIO test strip TEST IN THE MORNING AND  AT  BEDTIME 200 strip 3  . OVER THE COUNTER MEDICATION Take 4 capsules by mouth daily. Nitric oxide supplement    . OVER THE COUNTER MEDICATION Take 1 application by mouth daily as needed. Pre workout vitamin supplement mix    . Protein POWD Take 1 scoop by mouth 2 (two) times daily.    . sildenafil (VIAGRA) 100 MG tablet Take 0.5-1 tablets (50-100 mg total) by mouth daily as needed for erectile dysfunction. 6 tablet 19   No current facility-administered medications on file prior to visit.    No Known Allergies  Family History  Problem Relation Age of Onset  . Heart disease Mother        CABG-onset in her early 63's  . Diabetes Mother 31       had DM from her 99's  . Hypertension Mother   . Prostate cancer Father 13       dx in his 34s  . Stroke Neg Hx   . Colon cancer Neg Hx     BP (!) 162/86   Pulse 76   Ht 6'  (1.829 m)   Wt 220 lb (99.8 kg)   SpO2 95%   BMI 29.84 kg/m    Review of Systems He denies hypoglycemia    Objective:   Physical Exam VITAL SIGNS:  See vs page GENERAL: no distress Pulses: dorsalis pedis intact bilat.   MSK: no deformity of the feet CV: trace bilat leg edema Skin:  no ulcer on the feet.  normal color and temp on the feet. Neuro: sensation is intact to touch on the feet  A1c=9.8%  Lab Results  Component Value Date   CREATININE 1.73 (H) 10/03/2020   BUN 39 (H) 10/03/2020   NA 137 10/03/2020   K 4.0 10/03/2020   CL 99 10/03/2020   CO2 30 10/03/2020       Assessment & Plan:  Insulin-requiring type 2 DM, with CRI: uncontrolled.   Patient Instructions  I have sent a prescription to your pharmacy, to add Trulicity.   Please continue the same insulin.  On this type of insulin schedule, you should eat meals on a regular schedule.  If a meal is missed or significantly delayed, your blood sugar could go low.  check your blood sugar twice a day.  vary the time of day when you check, between before the 3 meals, and at bedtime.  also check if you have symptoms of your blood sugar being too high or too low.  please keep a record of the readings and bring it to your next appointment here (or you can bring the meter itself).  You can write it on any piece of paper.  please call us sooner if your blood sugar goes below 70, or if you have a lot of readings over 200.   Please come back for a follow-up appointment in 2 months.

## 2020-11-07 NOTE — Patient Instructions (Addendum)
I have sent a prescription to your pharmacy, to add Trulicity.   Please continue the same insulin.  On this type of insulin schedule, you should eat meals on a regular schedule.  If a meal is missed or significantly delayed, your blood sugar could go low.  check your blood sugar twice a day.  vary the time of day when you check, between before the 3 meals, and at bedtime.  also check if you have symptoms of your blood sugar being too high or too low.  please keep a record of the readings and bring it to your next appointment here (or you can bring the meter itself).  You can write it on any piece of paper.  please call us sooner if your blood sugar goes below 70, or if you have a lot of readings over 200.   Please come back for a follow-up appointment in 2 months.

## 2020-11-13 ENCOUNTER — Other Ambulatory Visit: Payer: Self-pay | Admitting: Internal Medicine

## 2020-11-14 ENCOUNTER — Other Ambulatory Visit: Payer: Self-pay | Admitting: Endocrinology

## 2020-11-15 ENCOUNTER — Other Ambulatory Visit: Payer: Self-pay | Admitting: *Deleted

## 2020-11-15 ENCOUNTER — Telehealth: Payer: Self-pay | Admitting: Endocrinology

## 2020-11-15 DIAGNOSIS — Z794 Long term (current) use of insulin: Secondary | ICD-10-CM

## 2020-11-15 DIAGNOSIS — N1831 Chronic kidney disease, stage 3a: Secondary | ICD-10-CM

## 2020-11-15 DIAGNOSIS — E1122 Type 2 diabetes mellitus with diabetic chronic kidney disease: Secondary | ICD-10-CM

## 2020-11-15 MED ORDER — TOUJEO MAX SOLOSTAR 300 UNIT/ML ~~LOC~~ SOPN: 110.0000 [IU] | PEN_INJECTOR | SUBCUTANEOUS | 3 refills | Status: DC

## 2020-11-15 NOTE — Telephone Encounter (Signed)
Per [facility]  Access Nurse "PT is calling about his medication they switched recently. PT says he thinks they sent in the wrong medication"

## 2020-11-15 NOTE — Telephone Encounter (Signed)
Notified pt --Dr. Loanne Drilling sent trulicity and toujeo. Pt stated did not received the toujeo. Resent [facility] to [facility].

## 2020-11-20 ENCOUNTER — Telehealth: Payer: Self-pay | Admitting: Endocrinology

## 2020-11-20 ENCOUNTER — Other Ambulatory Visit: Payer: Self-pay | Admitting: Endocrinology

## 2020-11-20 DIAGNOSIS — E1122 Type 2 diabetes mellitus with diabetic chronic kidney disease: Secondary | ICD-10-CM

## 2020-11-20 DIAGNOSIS — Z794 Long term (current) use of insulin: Secondary | ICD-10-CM

## 2020-11-20 NOTE — Telephone Encounter (Signed)
Pt called requesting a refill on his Toujeo.  PHARMACY:   [place], [place], [address] Phone:  [phone]  Fax:  [phone]

## 2020-11-20 NOTE — Telephone Encounter (Signed)
Sent Rx toujeo--[facility]--11/15/20

## 2020-12-16 ENCOUNTER — Telehealth: Payer: Self-pay | Admitting: Endocrinology

## 2020-12-16 NOTE — Telephone Encounter (Signed)
Notified pt instructions per Dr. Loanne Drilling.

## 2020-12-16 NOTE — Telephone Encounter (Signed)
For now, buy NPH at [facility], and take 55 units BID I'll see you next time.

## 2020-12-16 NOTE — Telephone Encounter (Signed)
Please advise 

## 2020-12-16 NOTE — Telephone Encounter (Signed)
Patient called back to advise that he reason for not taking Toujeo is that he does not have any.  States that he has been out and that [employer] send it to him.  Please review and advise patient at [phone]

## 2020-12-16 NOTE — Telephone Encounter (Signed)
Patient called to advise that he is only taking Trulicity and he is experiencing no appetite, lost 8 pounds, and is light headed.  Is not taking Toujeo at this time. He is asking for a call back to address his symptoms and which medication he should be taking.  Call back number for patient is [phone]

## 2020-12-17 ENCOUNTER — Ambulatory Visit (INDEPENDENT_AMBULATORY_CARE_PROVIDER_SITE_OTHER): Payer: 59 | Admitting: Endocrinology

## 2020-12-17 ENCOUNTER — Other Ambulatory Visit: Payer: Self-pay

## 2020-12-17 VITALS — BP 132/86 | HR 116 | Ht 71.5 in | Wt 194.0 lb

## 2020-12-17 DIAGNOSIS — Z794 Long term (current) use of insulin: Secondary | ICD-10-CM | POA: Diagnosis not present

## 2020-12-17 DIAGNOSIS — E1122 Type 2 diabetes mellitus with diabetic chronic kidney disease: Secondary | ICD-10-CM

## 2020-12-17 DIAGNOSIS — N1831 Chronic kidney disease, stage 3a: Secondary | ICD-10-CM

## 2020-12-17 LAB — BASIC METABOLIC PANEL
BUN: 64 mg/dL — ABNORMAL HIGH (ref 6–23)
CO2: 25 mEq/L (ref 19–32)
Calcium: 11.5 mg/dL — ABNORMAL HIGH (ref 8.4–10.5)
Chloride: 85 mEq/L — ABNORMAL LOW (ref 96–112)
Creatinine, Ser: 2.74 mg/dL — ABNORMAL HIGH (ref 0.40–1.50)
GFR: 25.04 mL/min — ABNORMAL LOW (ref >60.00)
Glucose, Bld: 796 mg/dL (ref 70–99)
Potassium: 4.9 mEq/L (ref 3.5–5.1)
Sodium: 123 mEq/L — ABNORMAL LOW (ref 135–145)

## 2020-12-17 LAB — T4, FREE: Free T4: 0.97 ng/dL (ref 0.60–1.60)

## 2020-12-17 LAB — TSH: TSH: 1.03 u[IU]/mL (ref 0.35–4.50)

## 2020-12-17 MED ORDER — INSULIN GLARGINE 100 UNIT/ML SOLOSTAR PEN: 50.0000 [IU] | PEN_INJECTOR | SUBCUTANEOUS | 3 refills | Status: DC

## 2020-12-17 NOTE — Progress Notes (Signed)
Subjective:    Patient ID: Wayne Webb, male    DOB: Sep 08, 1966, 55 y.o.   MRN: [id number]  HPI Pt returns for f/u of diabetes mellitus:  DM type: Insulin-requiring type 2 Dx'ed: 0881 Complications: DR and CRI.   Therapy: insulin since 2011.  DKA: never Severe hypoglycemia: never.  Pancreatitis: never SDOH: he works 3rd shift--Tresiba is therefore preferred, but ins has declined to continue; he does not check cbg's.  Other: he declines multiple daily injections.   Interval history: pt states he feels well in general.  He has been out of Toujeo x 1 month, as ins declined it.  However, he takes Trulicity as rx'ed.  He last worked 12/12/20.  He has sxs of lightheadedness, tremor, and fatigue Past Medical History:  Diagnosis Date  . ALLERGIC RHINITIS 03/15/2007  . CRI (chronic renal insufficiency)    CRI, creat 1.4 , renal u/s 04-2011 neg  . DIABETES MELLITUS, TYPE II 03/15/2007  . Diplopia 12/24/2009  . Elevated LFTs   . ERECTILE DYSFUNCTION 03/15/2007  . GOITER, MULTINODULAR 03/21/2010  . HYPERLIPIDEMIA 03/15/2007    Past Surgical History:  Procedure Laterality Date  . LASIK Left 10/2012    Social History   Socioeconomic History  . Marital status: Legally Separated    Spouse name: Not on file  . Number of children: 1  . Years of education: Not on file  . Highest education level: Not on file  Occupational History  . 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 Smoking status: Former Smoker    Packs/day: 0.50    Years: 10.00    Pack years: 5.00    Types: Cigarettes    Quit date: 10/26/1993    Years since quitting: 27.1  . Smokeless tobacco: Never Used  Vaping Use  . Vaping Use: Never used  Substance and Sexual Activity  . Alcohol use: No    Comment:    . Drug use: No  . Sexual activity: Not on file  Other Topics Concern  . Not on file  Social History Narrative   Divorced. Remarried : pt , wife    Social  Determinants of Radio broadcast assistant Strain: Not on file  Food Insecurity: Not on file  Transportation Needs: Not on file  Physical Activity: Not on file  Stress: Not on file  Social Connections: Not on file  Intimate Partner Violence: Not on file    Current Outpatient Medications on File Prior to Visit  Medication Sig Dispense Refill  . aspirin EC 81 MG tablet Take 81 mg by mouth daily.    Marland Kitchen azelastine (ASTELIN) 0.1 % nasal spray Place 2 sprays into both nostrils at bedtime as needed for rhinitis or allergies. 90 mL 3  . B-D UF III MINI PEN NEEDLES 31G X 5 MM MISC USE AS DIRECTED EVERY DAY 100 each 3  . Blood Glucose Monitoring Suppl (ONETOUCH VERIO) w/Device KIT 1 each by Does not apply route 2 (two) times daily. E11.9 1 kit 0  . Dulaglutide (TRULICITY) 1.03 PR/9.4VO SOPN Inject 0.75 mg into the skin once a week. 6 mL 3  . ezetimibe (ZETIA) 10 MG tablet Take 1 tablet (10 mg total) by mouth daily. 90 tablet 3  . Lancets (ONETOUCH DELICA PLUS [id number]) MISC TEST IN THE MORNING AND AT  BEDTIME 200 each 3  . lidocaine (LIDODERM) 5 % Place 1 patch onto the skin daily. Remove &  Discard patch within 12 hours or as directed by MD 30 patch 1  . losartan (COZAAR) 25 MG tablet TAKE 2 TABLETS BY MOUTH EVERY DAY 60 tablet 71  . ONETOUCH VERIO test strip TEST IN THE MORNING AND AT  BEDTIME 200 strip 3  . OVER THE COUNTER MEDICATION Take 4 capsules by mouth daily. Nitric oxide supplement    . OVER THE COUNTER MEDICATION Take 1 application by mouth daily as needed. Pre workout vitamin supplement mix    . Protein POWD Take 1 scoop by mouth 2 (two) times daily.    . sildenafil (VIAGRA) 100 MG tablet Take 0.5-1 tablets (50-100 mg total) by mouth daily as needed for erectile dysfunction. 6 tablet 19   No current facility-administered medications on file prior to visit.    No Known Allergies  Family History  Problem Relation Age of Onset  . Heart disease Mother        CABG-onset in her early  36's  . Diabetes Mother 64       had DM from her 68's  . Hypertension Mother   . Prostate cancer Father 27       dx in his 19s  . Stroke Neg Hx   . Colon cancer Neg Hx     BP 132/86 (BP Location: Right Arm, Patient Position: Sitting, Cuff Size: Large)   Pulse (!) 116   Ht 5' 11.5" (1.816 m)   Wt 194 lb (88 kg)   SpO2 97%   BMI 26.68 kg/m    Review of Systems He last lost 25 lbs since last ov    Objective:   Physical Exam VITAL SIGNS:  See vs page GENERAL: no distress Pulses: dorsalis pedis intact bilat.   MSK: no deformity of the feet CV: no leg edema Skin:  no ulcer on the feet.  normal color and temp on the feet. Neuro: sensation is intact to touch on the feet  Lab Results  Component Value Date   CREATININE 2.74 (H) 12/17/2020   BUN 64 (H) 12/17/2020   NA 123 (L) 12/17/2020   K 4.9 12/17/2020   CL 85 (L) 12/17/2020   CO2 25 12/17/2020       Assessment & Plan:  Insulin-requiring type 2 DM, severe exacerbation, due to noncompliance. Pt does not appear ill.  I called pt at 19:15 on 12/17/20, and left message on ans mach: drink plenty of fluids.  Go to ER if you feel unwell.    Patient Instructions  I have sent a prescription [facility], to change Toujeo to Lantus.  Because of your weight loss, take just 50 units each morning.  Until you get it from [facility], take 50 units each morning of the NPH.   Please continue the same Trulicity.   On this type of insulin schedule, you should eat meals on a regular schedule.  If a meal is missed or significantly delayed, your blood sugar could go low.  Blood tests are requested for you today.  We'll let you know about the results.  Drinking plenty of fluids helps the blood sugar, too.  check your blood sugar twice a day.  vary the time of day when you check, between before the 3 meals, and at bedtime.  also check if you have symptoms of your blood sugar being too high or too low.  please keep a record of the readings and bring  it to your next appointment here (or you can bring the meter  itself).  You can write it on any piece of paper.  please call us sooner if your blood sugar goes below 70, or if you have a lot of readings over 200.   Please come back for a follow-up appointment next month as scheduled.

## 2020-12-17 NOTE — Telephone Encounter (Signed)
Patient

## 2020-12-17 NOTE — Patient Instructions (Addendum)
I have sent a prescription to [facility], to change Toujeo to Lantus.  Because of your weight loss, take just 50 units each morning.  Until you get it from [facility], take 50 units each morning of the NPH.   Please continue the same Trulicity.   On this type of insulin schedule, you should eat meals on a regular schedule.  If a meal is missed or significantly delayed, your blood sugar could go low.  Blood tests are requested for you today.  We'll let you know about the results.  Drinking plenty of fluids helps the blood sugar, too.  check your blood sugar twice a day.  vary the time of day when you check, between before the 3 meals, and at bedtime.  also check if you have symptoms of your blood sugar being too high or too low.  please keep a record of the readings and bring it to your next appointment here (or you can bring the meter itself).  You can write it on any piece of paper.  please call us sooner if your blood sugar goes below 70, or if you have a lot of readings over 200.   Please come back for a follow-up appointment next month as scheduled.

## 2020-12-18 ENCOUNTER — Emergency Department (HOSPITAL_COMMUNITY): Payer: 59

## 2020-12-18 ENCOUNTER — Encounter (HOSPITAL_COMMUNITY): Payer: Self-pay | Admitting: Emergency Medicine

## 2020-12-18 ENCOUNTER — Telehealth: Payer: Self-pay | Admitting: Endocrinology

## 2020-12-18 ENCOUNTER — Emergency Department (HOSPITAL_COMMUNITY)
Admission: EM | Admit: 2020-12-18 | Discharge: 2020-12-18 | Disposition: A | Discharge status: Home / Self Care | Payer: 59 | Attending: Emergency Medicine | Admitting: Emergency Medicine

## 2020-12-18 DIAGNOSIS — Z794 Long term (current) use of insulin: Secondary | ICD-10-CM | POA: Diagnosis not present

## 2020-12-18 DIAGNOSIS — R42 Dizziness and giddiness: Secondary | ICD-10-CM | POA: Diagnosis not present

## 2020-12-18 DIAGNOSIS — N179 Acute kidney failure, unspecified: Secondary | ICD-10-CM

## 2020-12-18 DIAGNOSIS — E1165 Type 2 diabetes mellitus with hyperglycemia: Secondary | ICD-10-CM | POA: Diagnosis not present

## 2020-12-18 DIAGNOSIS — Z20822 Contact with and (suspected) exposure to covid-19: Secondary | ICD-10-CM | POA: Insufficient documentation

## 2020-12-18 DIAGNOSIS — R0789 Other chest pain: Secondary | ICD-10-CM | POA: Insufficient documentation

## 2020-12-18 DIAGNOSIS — N189 Chronic kidney disease, unspecified: Secondary | ICD-10-CM | POA: Diagnosis not present

## 2020-12-18 DIAGNOSIS — R739 Hyperglycemia, unspecified: Secondary | ICD-10-CM

## 2020-12-18 DIAGNOSIS — Z7982 Long term (current) use of aspirin: Secondary | ICD-10-CM | POA: Insufficient documentation

## 2020-12-18 DIAGNOSIS — Z87891 Personal history of nicotine dependence: Secondary | ICD-10-CM | POA: Insufficient documentation

## 2020-12-18 DIAGNOSIS — E860 Dehydration: Secondary | ICD-10-CM | POA: Diagnosis not present

## 2020-12-18 DIAGNOSIS — R000 Tachycardia, unspecified: Secondary | ICD-10-CM | POA: Diagnosis not present

## 2020-12-18 DIAGNOSIS — E1122 Type 2 diabetes mellitus with diabetic chronic kidney disease: Secondary | ICD-10-CM | POA: Insufficient documentation

## 2020-12-18 LAB — BASIC METABOLIC PANEL
Anion gap: 14 (ref 5–15)
BUN: 63 mg/dL — ABNORMAL HIGH (ref 6–20)
CO2: 25 mmol/L (ref 22–32)
Calcium: 10.7 mg/dL — ABNORMAL HIGH (ref 8.9–10.3)
Chloride: 90 mmol/L — ABNORMAL LOW (ref 98–111)
Creatinine, Ser: 2.59 mg/dL — ABNORMAL HIGH (ref 0.61–1.24)
GFR, Estimated: 28 mL/min — ABNORMAL LOW (ref >60)
Glucose, Bld: 519 mg/dL (ref 70–99)
Potassium: 3.9 mmol/L (ref 3.5–5.1)
Sodium: 129 mmol/L — ABNORMAL LOW (ref 135–145)

## 2020-12-18 LAB — CBC
HCT: 41.3 % (ref 39.0–52.0)
Hemoglobin: 15 g/dL (ref 13.0–17.0)
MCH: 29.9 pg (ref 26.0–34.0)
MCHC: 36.3 g/dL — ABNORMAL HIGH (ref 30.0–36.0)
MCV: 82.3 fL (ref 80.0–100.0)
Platelets: 266 10*3/uL (ref 150–400)
RBC: 5.02 MIL/uL (ref 4.22–5.81)
RDW: 12.2 % (ref 11.5–15.5)
WBC: 7.8 10*3/uL (ref 4.0–10.5)
nRBC: 0 % (ref 0.0–0.2)

## 2020-12-18 LAB — I-STAT VENOUS BLOOD GAS, ED
Acid-Base Excess: 2 mmol/L (ref 0.0–2.0)
Bicarbonate: 27.6 mmol/L (ref 20.0–28.0)
Calcium, Ion: 1.19 mmol/L (ref 1.15–1.40)
HCT: 46 % (ref 39.0–52.0)
Hemoglobin: 15.6 g/dL (ref 13.0–17.0)
O2 Saturation: 99 %
Potassium: 5.2 mmol/L — ABNORMAL HIGH (ref 3.5–5.1)
Sodium: 128 mmol/L — ABNORMAL LOW (ref 135–145)
TCO2: 29 mmol/L (ref 22–32)
pCO2, Ven: 47.3 mmHg (ref 44.0–60.0)
pH, Ven: 7.374 (ref 7.250–7.430)
pO2, Ven: 121 mmHg — ABNORMAL HIGH (ref 32.0–45.0)

## 2020-12-18 LAB — RESP PANEL BY RT-PCR (FLU A&B, COVID) ARPGX2
Influenza A by PCR: NEGATIVE
Influenza B by PCR: NEGATIVE
SARS Coronavirus 2 by RT PCR: NEGATIVE

## 2020-12-18 LAB — TROPONIN I (HIGH SENSITIVITY)
Troponin I (High Sensitivity): 10 ng/L (ref <18)
Troponin I (High Sensitivity): 11 ng/L (ref <18)

## 2020-12-18 LAB — CBG MONITORING, ED
Glucose-Capillary: 536 mg/dL (ref 70–99)
Glucose-Capillary: 453 mg/dL — ABNORMAL HIGH (ref 70–99)
Glucose-Capillary: 477 mg/dL — ABNORMAL HIGH (ref 70–99)
Glucose-Capillary: 403 mg/dL — ABNORMAL HIGH (ref 70–99)

## 2020-12-18 LAB — BRAIN NATRIURETIC PEPTIDE: B Natriuretic Peptide: 15.3 pg/mL (ref 0.0–100.0)

## 2020-12-18 MED ORDER — SODIUM CHLORIDE 0.9 % IV BOLUS
1000.0000 mL | Freq: Once | INTRAVENOUS | Status: AC
  Administered 2020-12-18: 1000 mL via INTRAVENOUS

## 2020-12-18 MED ORDER — INSULIN ASPART 100 UNIT/ML ~~LOC~~ SOLN
10.0000 [IU] | Freq: Once | SUBCUTANEOUS | Status: AC
  Administered 2020-12-18: 10 [IU] via SUBCUTANEOUS

## 2020-12-18 NOTE — Progress Notes (Signed)
Inpatient Diabetes Program Recommendations  AACE/ADA: New Consensus Statement on Inpatient Glycemic Control (2015)  Target Ranges:  Prepandial:   less than 140 mg/dL      Peak postprandial:   less than 180 mg/dL (1-2 hours)      Critically ill patients:  140 - 180 mg/dL   Lab Results  Component Value Date   HTXHFS 142 (HH) 12/18/2020   HGBA1C 9.8 (A) 11/07/2020    Review of Glycemic Control  Diabetes history: DM 2, Sees Dr. Renato Shin, Endocrinology Outpatient Diabetes medications: Toujeo 50 units, Trulicity 3.95 mg weekly Current orders for Inpatient glycemic control: eing Evaluated in ED  9.8% on 1/13 Glucose 796 on presentation  Pt called his Endocrinology office with issues getting his insulin. Dr. Loanne Drilling told him to buy the [facility] NPH insulin and take 55 units bid.  -  Levemir seems to be preferred by pts insurance  Inpatient Diabetes Program Recommendations:    -   Add Lantus 40 units -   Novolog 0-15 units tid + hs   Thanks,  Tama Headings RN, MSN, BC-ADM Inpatient Diabetes Coordinator Team Pager [phone] (8a-5p)

## 2020-12-18 NOTE — ED Triage Notes (Signed)
Patient complains of intermittent dizziness and pins and needles in his chest that started a few weeks ago. CBG 536 in triage, states having difficulty obtaining prescribed hypoglycemic medications due to insurance refusing to approve coverage. Patient alert, oriented, and in no apparent distress at this time. Room air SpO2 88% after ambulating to triage. Placed on 2L [place], SpO2 >95% on 2L [place].

## 2020-12-18 NOTE — ED Notes (Signed)
Pulse oximetry 97% while ambulating

## 2020-12-18 NOTE — ED Provider Notes (Signed)
[place] EMERGENCY DEPARTMENT Provider Note   CSN: [id number] Arrival date & time: 12/18/20  1040     History Chief Complaint  Patient presents with  . Dizziness    Wayne Webb is a 55 y.o. male history of uncontrolled diabetes, hypertension, here presenting with chest pressure and dizziness.  Patient states that he has some issues with insurance coverage regarding his medicines.  He states that he has been out of his Toujeo last month or so since his insurance declined it.  Patient still taking Trulicity.  Patient was prescribed Lantus yesterday by his endocrinologist.  Patient had labs drawn yesterday that shows sugar of 700 with normal anion gap.  However he also has a mild AKI with a creatinine of 2.5.  Today he started having some chest pressure and palpitations.  He also feels lightheaded and dizzy as well.  He was told by his endocrinologist to come in for evaluation.  Patient was noted to be hypoxic to 88% when he ambulated.  However he is not complaining of shortness of breath.  He denies any cough or fever.  He is fully vaccinated against COVID and has no Covid exposure.  Denies any leg swelling  The history is provided by the patient.       Past Medical History:  Diagnosis Date  . ALLERGIC RHINITIS 03/15/2007  . CRI (chronic renal insufficiency)    CRI, creat 1.4 , renal u/s 04-2011 neg  . DIABETES MELLITUS, TYPE II 03/15/2007  . Diplopia 12/24/2009  . Elevated LFTs   . ERECTILE DYSFUNCTION 03/15/2007  . GOITER, MULTINODULAR 03/21/2010  . HYPERLIPIDEMIA 03/15/2007    Patient Active Problem List   Diagnosis Date Noted  . Erectile dysfunction 06/29/2019  . CKD (chronic kidney disease) 06/20/2018  . Diabetes ([place]) 02/22/2016  . PCP NOTES >>>>>>>>>>>>>>>>>>>>>>>>>>>>>>> 12/16/2015  . Chronic cough 04/05/2015  . Elevated LFTs 01/03/2014  . Essential hypertension 12/12/2011  . Abnormal CXR 12/12/2011  . Annual physical exam 11/20/2011  . DOE  (dyspnea on exertion) 11/20/2011  . GOITER, MULTINODULAR 03/21/2010  . HYPERLIPIDEMIA 03/15/2007  . ALLERGIC RHINITIS 03/15/2007    Past Surgical History:  Procedure Laterality Date  . LASIK Left 10/2012       Family History  Problem Relation Age of Onset  . Heart disease Mother        CABG-onset in her early 59's  . Diabetes Mother 89       had DM from her 34's  . Hypertension Mother   . Prostate cancer Father 46       dx in his 5s  . Stroke Neg Hx   . Colon cancer Neg Hx     Social History   Tobacco Use  . Smoking status: Former Smoker    Packs/day: 0.50    Years: 10.00    Pack years: 5.00    Types: Cigarettes    Quit date: 10/26/1993    Years since quitting: 27.1  . Smokeless tobacco: Never Used  Vaping Use  . Vaping Use: Never used  Substance Use Topics  . Alcohol use: No    Comment:    . Drug use: No    Home Medications Prior to Admission medications   Medication Sig Start Date End Date Taking? Authorizing Provider  aspirin EC 81 MG tablet Take 81 mg by mouth daily.    [provider]  azelastine (ASTELIN) 0.1 % nasal spray Place 2 sprays into both nostrils at bedtime as needed for  rhinitis or allergies. 10/03/20   Colon Branch, MD  B-D UF III MINI PEN NEEDLES 31G X 5 MM MISC USE AS DIRECTED EVERY DAY 05/02/20   Renato Shin, MD  Blood Glucose Monitoring Suppl (ONETOUCH VERIO) w/Device KIT 1 each by Does not apply route 2 (two) times daily. E11.9 12/25/19   Renato Shin, MD  Dulaglutide (TRULICITY) 0.37 CW/8.8QB SOPN Inject 0.75 mg into the skin once a week. 11/07/20   Renato Shin, MD  ezetimibe (ZETIA) 10 MG tablet Take 1 tablet (10 mg total) by mouth daily. 10/03/20   Colon Branch, MD  insulin glargine (LANTUS) 100 UNIT/ML Solostar Pen Inject 50 Units into the skin every morning. And pen needles 1/day 12/17/20   Renato Shin, MD  Lancets [facility] DELICA PLUS [id number]) [place] TEST IN THE MORNING AND AT  BEDTIME 03/03/20   Renato Shin, MD  lidocaine  (LIDODERM) 5 % Place 1 patch onto the skin daily. Remove & Discard patch within 12 hours or as directed by MD 11/13/20   Colon Branch, MD  losartan (COZAAR) 25 MG tablet TAKE 2 TABLETS BY MOUTH EVERY DAY 09/21/20   Colon Branch, MD  [facility] VERIO test strip TEST IN THE MORNING AND AT  BEDTIME 03/01/20   Renato Shin, MD  OVER THE COUNTER MEDICATION Take 4 capsules by mouth daily. Nitric oxide supplement    [provider]  OVER THE COUNTER MEDICATION Take 1 application by mouth daily as needed. Pre workout vitamin supplement mix    [provider]  Protein POWD Take 1 scoop by mouth 2 (two) times daily.    [provider]  sildenafil (VIAGRA) 100 MG tablet Take 0.5-1 tablets (50-100 mg total) by mouth daily as needed for erectile dysfunction. 09/05/20   Renato Shin, MD    Allergies    Patient has no known allergies.  Review of Systems   Review of Systems  Constitutional: Positive for fatigue.  Neurological: Positive for dizziness.  All other systems reviewed and are negative.   Physical Exam Updated Vital Signs BP (!) 156/101 (BP Location: Left Arm)   Pulse (!) 118   Temp 97.9 F (36.6 C)   Resp 20   SpO2 92%   Physical Exam Vitals and nursing note reviewed.  Constitutional:      Comments: Chronically ill and tachycardic and dehydrated  HENT:     Head: Normocephalic.     Nose: Nose normal.     Mouth/Throat:     Mouth: Mucous membranes are dry.  Eyes:     Extraocular Movements: Extraocular movements intact.     Pupils: Pupils are equal, round, and reactive to light.  Cardiovascular:     Rate and Rhythm: Regular rhythm. Tachycardia present.     Pulses: Normal pulses.     Heart sounds: Normal heart sounds.  Pulmonary:     Effort: Pulmonary effort is normal.     Breath sounds: Normal breath sounds.  Abdominal:     General: Abdomen is flat.     Palpations: Abdomen is soft.  Musculoskeletal:        General: Normal range of motion.     Cervical  back: Normal range of motion and neck supple.  Skin:    General: Skin is warm.     Capillary Refill: Capillary refill takes less than 2 seconds.  Neurological:     General: No focal deficit present.     Mental Status: He is oriented to person, place, and time.  Psychiatric:        Mood and Affect: Mood normal.        Behavior: Behavior normal.     ED Results / Procedures / Treatments   Labs (all labs ordered are listed, but only abnormal results are displayed) Labs Reviewed  BASIC METABOLIC PANEL - Abnormal; Notable for the following components:      Result Value   Sodium 129 (*)    Chloride 90 (*)    Glucose, Bld 519 (*)    BUN 63 (*)    Creatinine, Ser 2.59 (*)    Calcium 10.7 (*)    GFR, Estimated 28 (*)    All other components within normal limits  CBC - Abnormal; Notable for the following components:   MCHC 36.3 (*)    All other components within normal limits  CBG MONITORING, ED - Abnormal; Notable for the following components:   Glucose-Capillary 536 (*)    All other components within normal limits  CBG MONITORING, ED - Abnormal; Notable for the following components:   Glucose-Capillary 453 (*)    All other components within normal limits  RESP PANEL BY RT-PCR (FLU A&B, COVID) ARPGX2  BLOOD GAS, VENOUS  BRAIN NATRIURETIC PEPTIDE  TROPONIN I (HIGH SENSITIVITY)  TROPONIN I (HIGH SENSITIVITY)    EKG EKG Interpretation  Date/Time:  Wednesday December 18 2020 10:53:09 EST Ventricular Rate:  124 PR Interval:  152 QRS Duration: 86 QT Interval:  312 QTC Calculation: 448 R Axis:   25 Text Interpretation: Sinus tachycardia ST & T wave abnormality, consider inferolateral ischemia Abnormal ECG No previous ECGs available Confirmed by Wandra Arthurs [id number]) on 12/18/2020 3:05:14 PM   Radiology DG Chest 2 View  Result Date: 12/18/2020 CLINICAL DATA:  Chest pain. EXAM: CHEST - 2 VIEW COMPARISON:  Rib series 08/16/2020. Chest x-ray 10/31/2016. CT 04/10/2015. FINDINGS:  Stable mediastinal and hilar/infrahilar prominence consistent with known chronic adenopathy. Low lung volumes with persistent bibasilar atelectasis and or scarring. No acute infiltrate. No pleural effusion or pneumothorax. Heart size normal. No acute bony abnormality. IMPRESSION: 1. Stable mediastinal and hilar/infrahilar prominence consistent with known chronic adenopathy. 2. Low lung volumes with persistent bibasilar atelectasis and or scarring. No acute infiltrate. Electronically Signed   By: Marcello Moores  Register   On: 12/18/2020 11:46    Procedures Procedures   Medications Ordered in ED Medications  insulin aspart (novoLOG) injection 10 Units (has no administration in time range)  sodium chloride 0.9 % bolus 1,000 mL (has no administration in time range)    ED Course  I have reviewed the triage vital signs and the nursing notes.  Pertinent labs & imaging results that were available during my care of the patient were reviewed by me and considered in my medical decision making (see chart for details).    MDM Rules/Calculators/A&P                         BRANDT CHANEY is a 55 y.o. male still with tachycardia and chest pressure.  Patient's blood sugar was 700 yesterday has mild AKI.  I think his tachycardia likely from his hyperglycemia and dehydration.  We will repeat blood work to make sure he is not in DKA.  We will also get a VBG as well.  Will get troponin x2.  I have low suspicion for PE.  We will test for Covid as well.  He is not hypoxic in the room right now.   5:36 PM  Patient's oxygen remained around 95 to 96% even with ambulation.  Not sure if his fingers were cold and that is what is causing initial erroneous reading.  His Covid test is negative.  His troponins are negative x2.  His BNP is normal.  His chemistry showed glucose of 519 anion gap of 14 and creatinine of 2.5.  His creatinine was the same yesterday but this was elevated compared to his baseline about 1.8.  Patient was  given IV fluids and insulin and his sugar went down to 400.  I told him to use his NPH as prescribed. He will get insurance approval to get [facility]. Recommend repeat BMP in a week and follow up with PCP and endocrinology   Final Clinical Impression(s) / ED Diagnoses Final diagnoses:  None    Rx / DC Orders ED Discharge Orders    None       Drenda Freeze, MD 12/18/20 [phone]

## 2020-12-18 NOTE — Telephone Encounter (Signed)
URGENT MESSAGE FOR Wayne STALLING (ELLISON)  Patient called to advised that he took extra 15 units insulin before eating at 11 PM last night and blood sugar was down to 566. Patient took fasting blood sugar this morning was 425 at 9 AM - patient experiencing pressure, tightness, and needles in arm. Wants to know what he should do.

## 2020-12-18 NOTE — ED Notes (Signed)
Pt ambulated to waiting room chair. I took pt vitals o2 was low 86 %  When he sat for a min o2 went to 92% but when pt started back talking o2 dropped to 88% and became SOB. I put him on 2L of o2 and informed nurse of pt condition. Pt is now in triage.

## 2020-12-18 NOTE — Telephone Encounter (Signed)
Pt has been contacted and advised to go to ER

## 2020-12-18 NOTE — ED Notes (Signed)
D&C IV 

## 2020-12-18 NOTE — ED Notes (Signed)
PT removed from O2 for sats of 96% on ra.

## 2020-12-18 NOTE — Discharge Instructions (Signed)
Your blood sugar was elevated today.  Please take your NPH insulin and your diabetes medicines as prescribed   Stay hydrated   Repeat kidney function in a week   See your endocrinologist and primary care doctor   Return to ER if your blood glucose is > 600 or less than 60, vomiting, fever, trouble breathing, abdominal pain

## 2020-12-18 NOTE — Telephone Encounter (Signed)
Go to ER now

## 2020-12-19 ENCOUNTER — Telehealth: Payer: Self-pay | Admitting: Endocrinology

## 2020-12-19 NOTE — Telephone Encounter (Signed)
Patient requests to be called at ph# [phone] som that Patient can update Dr. Loanne Drilling on Patient's high blood sugars (currently blood sugars are 424 at 1:52 pm today). This morning at 7 am before breakfast blood sugars=379.

## 2020-12-19 NOTE — Telephone Encounter (Signed)
Message sent thru MyChart 

## 2020-12-19 NOTE — Telephone Encounter (Signed)
Please Advise

## 2020-12-19 NOTE — Telephone Encounter (Signed)
Please verify pt takes NPH, 50 units qam.  Then please increase to 70 units qam.  Drink plenty of fluids.  Please call or message [place] next week, to tell us how the blood sugar is doing.

## 2020-12-20 ENCOUNTER — Telehealth: Payer: Self-pay | Admitting: Endocrinology

## 2020-12-20 NOTE — Telephone Encounter (Signed)
Pt called after just being off the phone with [facility] and they informed him we sent a prescription for insulin but did not include how many units and how many to disperse. Pt states this is the only thing holding them up from refilling medication.

## 2020-12-21 NOTE — Telephone Encounter (Signed)
LVM for pt to let him know that I will reach out to [facility] Monday regarding a medication refill on his insulin.

## 2020-12-23 NOTE — Telephone Encounter (Signed)
Patient requests to be called at ph# [phone] re: Patient has not received call from our office since sending MyChart message. Patient states he is still feeling dizzy and lightheaded (blood sugars this morning=370, last night (12/22/20)=480. Patient states he took 70 units for the first time.

## 2020-12-24 ENCOUNTER — Telehealth: Payer: Self-pay | Admitting: Endocrinology

## 2020-12-24 ENCOUNTER — Telehealth: Payer: Self-pay

## 2020-12-24 NOTE — Telephone Encounter (Signed)
Spoke with Wayne Webb and he stated that he is taking 70u per day which he started yesterday. His cbg is running between 3/400. Last reading was after lunch which was 321 and this morning it was running 247. He is eating more to make sure he has enough food on his stomach to accommodate the insulin but he is still sleeping a lot and at times feels like he got hit by truck. He also asked a question about what is he suppose to do when the new insulin comes in the mail. Do he start taking the new insulin or should he stay on what he is taking now? I think the ne medication is Toujeo.  Please Advise

## 2020-12-24 NOTE — Telephone Encounter (Signed)
please contact patient: Please verify how many units of insulin you take How are cbg's doing? Are your symptoms improving?  What are your symptoms now?

## 2020-12-24 NOTE — Telephone Encounter (Signed)
Please increase the insulin (either Toujeo or Lantus is OK) to 90 units qam.  Please call or message [place] on Friday, to tell us how the blood sugar is doing.  We have received the form for your short term disability.  I expect your blood sugar will improve, and that you will feel better soon.  Should we shoot for you returning to work next week?

## 2020-12-24 NOTE — Telephone Encounter (Signed)
Message sent to [facility]

## 2020-12-25 ENCOUNTER — Other Ambulatory Visit: Payer: Self-pay

## 2020-12-25 ENCOUNTER — Encounter: Payer: Self-pay | Admitting: Internal Medicine

## 2020-12-25 ENCOUNTER — Ambulatory Visit (INDEPENDENT_AMBULATORY_CARE_PROVIDER_SITE_OTHER): Payer: 59 | Admitting: Internal Medicine

## 2020-12-25 VITALS — BP 116/84 | HR 96 | Temp 98.2°F | Resp 16 | Ht 72.0 in | Wt 195.2 lb

## 2020-12-25 DIAGNOSIS — Z794 Long term (current) use of insulin: Secondary | ICD-10-CM | POA: Diagnosis not present

## 2020-12-25 DIAGNOSIS — E1122 Type 2 diabetes mellitus with diabetic chronic kidney disease: Secondary | ICD-10-CM | POA: Diagnosis not present

## 2020-12-25 DIAGNOSIS — N1831 Chronic kidney disease, stage 3a: Secondary | ICD-10-CM | POA: Diagnosis not present

## 2020-12-25 DIAGNOSIS — N179 Acute kidney failure, unspecified: Secondary | ICD-10-CM | POA: Diagnosis not present

## 2020-12-25 LAB — BASIC METABOLIC PANEL
BUN: 42 mg/dL — ABNORMAL HIGH (ref 6–23)
CO2: 30 mEq/L (ref 19–32)
Calcium: 10.3 mg/dL (ref 8.4–10.5)
Chloride: 96 mEq/L (ref 96–112)
Creatinine, Ser: 1.86 mg/dL — ABNORMAL HIGH (ref 0.40–1.50)
GFR: 39.86 mL/min — ABNORMAL LOW (ref >60.00)
Glucose, Bld: 362 mg/dL — ABNORMAL HIGH (ref 70–99)
Potassium: 4.1 mEq/L (ref 3.5–5.1)
Sodium: 133 mEq/L — ABNORMAL LOW (ref 135–145)

## 2020-12-25 NOTE — Telephone Encounter (Signed)
Pt called to give Mardene Celeste an updated log of blood sugar readings:  12/24/20 - 321 after lunch 12:21  12/25/20 - 574 at 12:33am  315 at 7:30 am before meal and shot  591 after eating lunch (ate shrimp, broccoli, cauliflower)   Pt states his Dr does not want him going to work until after his appt with Dr.Ellison.   Pt requests a call to see what he should do now at [phone].

## 2020-12-25 NOTE — Telephone Encounter (Signed)
Please Advise

## 2020-12-25 NOTE — Patient Instructions (Signed)
Please call your endocrinologist about how much insulin you need to be taking  GO TO THE LAB : Get the blood work     [place], [place] back for a check up in 3 months

## 2020-12-25 NOTE — Progress Notes (Signed)
Subjective:    Patient ID: Wayne Webb, male    DOB: 1965-10-29, 55 y.o.   MRN: [id number]  DOS:  12/25/2020 Type of visit - description: ER  Follow-up  Went to the ER 12/18/2020: Prior to the ER, saw Endo, blood work show a blood sugar of 700, he also had mild AKI. At the time he had insurance issues and was out of [place] for at least a month.  He developed some chest pain palpitations, slight lightheadedness.   ER Labs reviewed, sodium 129, creatinine 2.5, calcium 10.7, CBC okay, troponin negative, chest x-ray no acute, BNP normal, respiratory panel negative, received IV fluids and insulin. At end, CBG went down to 400. Was recommended to use NPH.   Review of Systems Since he left the ER, denies chest pain but is still feeling very fatigued, dizzy at times.   Past Medical History:  Diagnosis Date  . ALLERGIC RHINITIS 03/15/2007  . CRI (chronic renal insufficiency)    CRI, creat 1.4 , renal u/s 04-2011 neg  . DIABETES MELLITUS, TYPE II 03/15/2007  . Diplopia 12/24/2009  . Elevated LFTs   . ERECTILE DYSFUNCTION 03/15/2007  . GOITER, MULTINODULAR 03/21/2010  . HYPERLIPIDEMIA 03/15/2007    Past Surgical History:  Procedure Laterality Date  . LASIK Left 10/2012    Allergies as of 12/25/2020   No Known Allergies     Medication List       Accurate as of December 25, 2020 11:59 PM. If you have any questions, ask your nurse or doctor.        aspirin EC 81 MG tablet Take 81 mg by mouth daily.   azelastine 0.1 % nasal spray Commonly known as: ASTELIN Place 2 sprays into both nostrils at bedtime as needed for rhinitis or allergies.   B-D UF III MINI PEN NEEDLES 31G X 5 MM Misc Generic drug: Insulin Pen Needle USE AS DIRECTED EVERY DAY   ezetimibe 10 MG tablet Commonly known as: Zetia Take 1 tablet (10 mg total) by mouth daily.   insulin glargine 100 UNIT/ML Solostar Pen Commonly known as: LANTUS Inject 50 Units into the skin every morning. And pen needles 1/day    lidocaine 5 % Commonly known as: LIDODERM Place 1 patch onto the skin daily. Remove & Discard patch within 12 hours or as directed by MD   losartan 25 MG tablet Commonly known as: COZAAR TAKE 2 TABLETS BY MOUTH EVERY DAY   OneTouch Delica Plus [id number] Misc TEST IN THE MORNING AND AT  BEDTIME   OneTouch Verio test strip Generic drug: glucose blood TEST IN THE MORNING AND AT  BEDTIME   OneTouch Verio w/Device Kit 1 each by Does not apply route 2 (two) times daily. E11.9   OVER THE COUNTER MEDICATION Take 4 capsules by mouth daily. Nitric oxide supplement   OVER THE COUNTER MEDICATION Take 1 application by mouth daily as needed. Pre workout vitamin supplement mix   Protein Powd Take 1 scoop by mouth 2 (two) times daily.   sildenafil 100 MG tablet Commonly known as: VIAGRA Take 0.5-1 tablets (50-100 mg total) by mouth daily as needed for erectile dysfunction.   Trulicity 4.03 KV/4.2VZ Sopn Generic drug: Dulaglutide Inject 0.75 mg into the skin once a week.          Objective:   Physical Exam BP 116/84 (BP Location: Left Arm, Patient Position: Sitting, Cuff Size: Normal)   Pulse 96   Temp 98.2 F (36.8 C) (Oral)  Resp 16   Ht 6' (1.829 m)   Wt 195 lb 4 oz (88.6 kg)   SpO2 96%   BMI 26.48 kg/m  General:   Well developed, NAD, BMI noted. HEENT:  Normocephalic . Face symmetric, atraumatic Lungs:  CTA B Normal respiratory effort, no intercostal retractions, no accessory muscle use. Heart: RRR,  no murmur.  Lower extremities: no pretibial edema bilaterally  Skin: Not pale. Not jaundice Neurologic:  alert & oriented X3.  Speech normal, gait appropriate for age and unassisted Psych--  Cognition and judgment appear intact.  Cooperative with normal attention span and concentration.  Behavior appropriate. No anxious or depressed appearing.      Assessment     Assessment DM - dr Loanne Drilling Hyperlipidemia (h/o increased LFTs w/ simva 2013) Goiter -- per dr  Loanne Drilling, last [place] 06/2016 stable CKD - renal US (-) 2012 ; not f/u  by nephrology as off 09/2018 ED Elevated LFTs Ultrasound 2015 negative,  hepatitis serologies (-) 01-2015: ferritin (-) previously slt elevated; wnl  transferrin saturation, ANA, ceruloplasmin, anti smooth muscles ab. Alpha 1 antitrypsin. Saw GI 10/2017: U/S wnl, labs borderline abnormal was RX a liver Bx Pulmonary: --DOE --Chronic Cough, better after ACEi changed to Diovan 03-2015---Dr. Wert --Burnout sarcoidosis : abnormal CXR, Confirmed by CT, likely due to burnout sarcoidosis.   PLAN: DM: The patient run out of insulin due to insurance constraints, CBG  was 700, went to the ER, got IV fluids, insulin.  Creatinine noted to be elevated. Currently still not feeling well, dizzy at times, sleepy. He is now on insulin NPH (from [facility]) 70 units, Lantus according to the patient has been shipped by his insurance but still does not have it at home. Ambulatory CBGs are mostly in the 200s with spikes in the high 300s. Plan: BMP now.  Advised patient to contact Endo for further advice of CBG management. Paperwork: Has a paperwork, he has not been working since 12/12/2020, I do not think he needs to be working until he feels well, off work until 01/09/2021. RTC 3 months  Time spent 35 minutes, chart reviewed, coordinated patient care  This visit occurred during the SARS-CoV-2 public health emergency.  Safety protocols were in place, including screening questions prior to the visit, additional usage of staff PPE, and extensive cleaning of exam room while observing appropriate contact time as indicated for disinfecting solutions.

## 2020-12-25 NOTE — Progress Notes (Unsigned)
Pre visit review using our clinic review tool, if applicable. No additional management support is needed unless otherwise documented below in the visit note. 

## 2020-12-26 ENCOUNTER — Telehealth: Payer: Self-pay

## 2020-12-26 ENCOUNTER — Encounter: Payer: Self-pay | Admitting: Endocrinology

## 2020-12-26 NOTE — Telephone Encounter (Signed)
ST form completed and faxed back to [place] at [phone] w/ ed notes from 12/18/20 and ov notes from 12/25/20. Pt excused from work 12/12/20 to 01/09/21. Form sent for scanning.

## 2020-12-26 NOTE — Telephone Encounter (Signed)
Please increase to 120 units qam

## 2020-12-26 NOTE — Telephone Encounter (Signed)
Received fax confirmation

## 2020-12-26 NOTE — Assessment & Plan Note (Signed)
DM: The patient run out of insulin due to insurance constraints, CBG  was 700, went to the ER, got IV fluids, insulin.  Creatinine noted to be elevated. Currently still not feeling well, dizzy at times, sleepy. He is now on insulin NPH (from [facility]) 70 units, Lantus according to the patient has been shipped by his insurance but still does not have it at home. Ambulatory CBGs are mostly in the 200s with spikes in the high 300s. Plan: BMP now.  Advised patient to contact Endo for further advice of CBG management. Paperwork: Has a paperwork, he has not been working since 12/12/2020, I do not think he needs to be working until he feels well, off work until 01/09/2021. RTC 3 months

## 2020-12-30 NOTE — Telephone Encounter (Signed)
Patient is requesting a copy of ST disability form, patient would like a call back when he can come up to our office to pick up.

## 2020-12-30 NOTE — Telephone Encounter (Signed)
LMOM informing Pt that form is ready for pick up at front desk.

## 2020-12-30 NOTE — Telephone Encounter (Signed)
[facility] is now calling because she also needed to verify the size of the pen needle for this patient. [phone] PH#   REF# [id number]

## 2020-12-31 ENCOUNTER — Other Ambulatory Visit: Payer: Self-pay

## 2020-12-31 ENCOUNTER — Telehealth: Payer: Self-pay | Admitting: Endocrinology

## 2020-12-31 DIAGNOSIS — E1122 Type 2 diabetes mellitus with diabetic chronic kidney disease: Secondary | ICD-10-CM

## 2020-12-31 DIAGNOSIS — N183 Chronic kidney disease, stage 3 unspecified: Secondary | ICD-10-CM

## 2020-12-31 DIAGNOSIS — Z794 Long term (current) use of insulin: Secondary | ICD-10-CM

## 2020-12-31 MED ORDER — BD PEN NEEDLE MINI U/F 31G X 5 MM MISC: 3 refills | Status: DC

## 2020-12-31 NOTE — Telephone Encounter (Signed)
Patient called re: Patient has been having a hard time finding a PHARM that carries the RX listed below. Patient found a [facility] on [address] in [place] that has them in stock but needs to receive the following RX:  MEDICATION: B-D UF III MINI PEN NEEDLES 31G X 5 MM MISC  PHARMACY:   [facility] on [address] in [place] CONTACTED [place]?  Yes  IS THIS A 90 DAY SUPPLY : Yes  IS PATIENT OUT OF MEDICATION:  No  IF NOT; HOW MUCH IS LEFT: Approx. 10  LAST APPOINTMENT DATE: @3 /10/2020  NEXT APPOINTMENT DATE:@3 /17/2022  DO WE HAVE YOUR PERMISSION TO LEAVE A DETAILED MESSAGE?: Yes  OTHER COMMENTS:    **Let patient know to contact pharmacy at the end of the day to make sure medication is ready. **  ** Please notify patient to allow 48-72 hours to process**  **Encourage patient to contact the pharmacy for refills or they can request refills through [facility]**

## 2021-01-09 ENCOUNTER — Other Ambulatory Visit: Payer: Self-pay

## 2021-01-09 ENCOUNTER — Ambulatory Visit (INDEPENDENT_AMBULATORY_CARE_PROVIDER_SITE_OTHER): Payer: 59 | Admitting: Endocrinology

## 2021-01-09 ENCOUNTER — Telehealth: Payer: Self-pay | Admitting: Endocrinology

## 2021-01-09 VITALS — BP 120/84 | HR 110 | Ht 71.0 in | Wt 208.2 lb

## 2021-01-09 DIAGNOSIS — E1122 Type 2 diabetes mellitus with diabetic chronic kidney disease: Secondary | ICD-10-CM | POA: Diagnosis not present

## 2021-01-09 DIAGNOSIS — Z794 Long term (current) use of insulin: Secondary | ICD-10-CM

## 2021-01-09 DIAGNOSIS — N1831 Chronic kidney disease, stage 3a: Secondary | ICD-10-CM

## 2021-01-09 LAB — POCT GLYCOSYLATED HEMOGLOBIN (HGB A1C): Hemoglobin A1C: 9.5 % — AB (ref 4.0–5.6)

## 2021-01-09 MED ORDER — INSULIN GLARGINE 100 UNIT/ML SOLOSTAR PEN: 130.0000 [IU] | PEN_INJECTOR | SUBCUTANEOUS | 3 refills | Status: DC

## 2021-01-09 NOTE — Progress Notes (Signed)
Subjective:    Patient ID: Wayne Webb, male    DOB: 05-Oct-1966, 55 y.o.   MRN: [id number]  HPI Pt returns for f/u of diabetes mellitus:  DM type: Insulin-requiring type 2 Dx'ed: 5885 Complications: DR and CRI.   Therapy: insulin since 2011.  DKA: never Severe hypoglycemia: never.  Pancreatitis: never SDOH: he works 3rd shift--Tresiba is therefore preferred, but ins has declined to continue; he does not check cbg's.  Other: he declines multiple daily injections.   Interval history: pt states he feels well in general.  Pt says he never misses the insulin. He takes 130 units qam.  Meter is downloaded today, and the printout is scanned into the record.  cbg varies from 141-297.  There is no trend throughout the day.   Past Medical History:  Diagnosis Date  . ALLERGIC RHINITIS 03/15/2007  . CRI (chronic renal insufficiency)    CRI, creat 1.4 , renal u/s 04-2011 neg  . DIABETES MELLITUS, TYPE II 03/15/2007  . Diplopia 12/24/2009  . Elevated LFTs   . ERECTILE DYSFUNCTION 03/15/2007  . GOITER, MULTINODULAR 03/21/2010  . HYPERLIPIDEMIA 03/15/2007    Past Surgical History:  Procedure Laterality Date  . LASIK Left 10/2012    Social History   Socioeconomic History  . Marital status: Legally Separated    Spouse name: Not on file  . Number of children: 1  . Years of education: Not on file  . Highest education level: Not on file  Occupational History  . 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 Smoking status: Former Smoker    Packs/day: 0.50    Years: 10.00    Pack years: 5.00    Types: Cigarettes    Quit date: 10/26/1993    Years since quitting: 27.2  . Smokeless tobacco: Never Used  Vaping Use  . Vaping Use: Never used  Substance and Sexual Activity  . Alcohol use: No    Comment:    . Drug use: No  . Sexual activity: Not on file  Other Topics Concern  . Not on file  Social History Narrative   Divorced. Remarried  : pt , wife    Social Determinants of Radio broadcast assistant Strain: Not on file  Food Insecurity: Not on file  Transportation Needs: Not on file  Physical Activity: Not on file  Stress: Not on file  Social Connections: Not on file  Intimate Partner Violence: Not on file    Current Outpatient Medications on File Prior to Visit  Medication Sig Dispense Refill  . aspirin EC 81 MG tablet Take 81 mg by mouth daily.    Marland Kitchen azelastine (ASTELIN) 0.1 % nasal spray Place 2 sprays into both nostrils at bedtime as needed for rhinitis or allergies. 90 mL 3  . Blood Glucose Monitoring Suppl (ONETOUCH VERIO) w/Device KIT 1 each by Does not apply route 2 (two) times daily. E11.9 1 kit 0  . Dulaglutide (TRULICITY) 0.27 XA/1.2IN SOPN Inject 0.75 mg into the skin once a week. 6 mL 3  . ezetimibe (ZETIA) 10 MG tablet Take 1 tablet (10 mg total) by mouth daily. 90 tablet 3  . Insulin Pen Needle (B-D UF III MINI PEN NEEDLES) 31G X 5 MM MISC USE AS DIRECTED EVERY DAY 100 each 3  . Lancets (ONETOUCH DELICA PLUS [id number]) MISC TEST IN THE MORNING AND AT  BEDTIME 200 each 3  . lidocaine (LIDODERM) 5 %  Place 1 patch onto the skin daily. Remove & Discard patch within 12 hours or as directed by MD 30 patch 1  . losartan (COZAAR) 25 MG tablet TAKE 2 TABLETS BY MOUTH EVERY DAY 60 tablet 71  . ONETOUCH VERIO test strip TEST IN THE MORNING AND AT  BEDTIME 200 strip 3  . OVER THE COUNTER MEDICATION Take 4 capsules by mouth daily. Nitric oxide supplement    . OVER THE COUNTER MEDICATION Take 1 application by mouth daily as needed. Pre workout vitamin supplement mix    . Protein POWD Take 1 scoop by mouth 2 (two) times daily.    . sildenafil (VIAGRA) 100 MG tablet Take 0.5-1 tablets (50-100 mg total) by mouth daily as needed for erectile dysfunction. 6 tablet 19   No current facility-administered medications on file prior to visit.    No Known Allergies  Family History  Problem Relation Age of Onset  . Heart  disease Mother        CABG-onset in her early 61's  . Diabetes Mother 30       had DM from her 74's  . Hypertension Mother   . Prostate cancer Father 47       dx in his 72s  . Stroke Neg Hx   . Colon cancer Neg Hx     BP 120/84 (BP Location: Right Arm, Patient Position: Sitting, Cuff Size: Large)   Pulse (!) 110   Ht 5' 11"  (1.803 m)   Wt 208 lb 3.2 oz (94.4 kg)   SpO2 96%   BMI 29.04 kg/m    Review of Systems     Objective:   Physical Exam VITAL SIGNS:  See vs page GENERAL: no distress Pulses: dorsalis pedis intact bilat.   MSK: no deformity of the feet CV: no leg edema Skin:  no ulcer on the feet.  normal color and temp on the feet. Neuro: sensation is intact to touch on the feet.     Lab Results  Component Value Date   HGBA1C 9.5 (A) 01/09/2021      Assessment & Plan:  Insulin-requiring type 2 DM, much better back on insulin.    Patient Instructions  Please continue the same medications.   check your blood sugar twice a day.  vary the time of day when you check, between before the 3 meals, and at bedtime.  also check if you have symptoms of your blood sugar being too high or too low.  please keep a record of the readings and bring it to your next appointment here (or you can bring the meter itself).  You can write it on any piece of paper.  please call us sooner if your blood sugar goes below 70, or if most of your readings are over 200.   Please come back for a follow-up appointment in 2 months.

## 2021-01-09 NOTE — Telephone Encounter (Signed)
Pt requests a MyChart message when the letter to his job has been sent in regarding him to be able to return back to work.

## 2021-01-09 NOTE — Telephone Encounter (Signed)
Please Advise

## 2021-01-09 NOTE — Patient Instructions (Signed)
Please continue the same medications.   check your blood sugar twice a day.  vary the time of day when you check, between before the 3 meals, and at bedtime.  also check if you have symptoms of your blood sugar being too high or too low.  please keep a record of the readings and bring it to your next appointment here (or you can bring the meter itself).  You can write it on any piece of paper.  please call us sooner if your blood sugar goes below 70, or if most of your readings are over 200.   Please come back for a follow-up appointment in 2 months.

## 2021-01-10 ENCOUNTER — Ambulatory Visit: Payer: 59 | Admitting: Endocrinology

## 2021-01-12 ENCOUNTER — Encounter: Payer: Self-pay | Admitting: Endocrinology

## 2021-01-20 ENCOUNTER — Other Ambulatory Visit: Payer: Self-pay | Admitting: *Deleted

## 2021-01-20 DIAGNOSIS — N183 Chronic kidney disease, stage 3 unspecified: Secondary | ICD-10-CM

## 2021-01-20 DIAGNOSIS — Z794 Long term (current) use of insulin: Secondary | ICD-10-CM

## 2021-01-20 MED ORDER — BD PEN NEEDLE MINI U/F 31G X 5 MM MISC: 3 refills | Status: DC

## 2021-01-27 ENCOUNTER — Ambulatory Visit: Payer: 59 | Admitting: Internal Medicine

## 2021-01-27 DIAGNOSIS — Z0289 Encounter for other administrative examinations: Secondary | ICD-10-CM

## 2021-01-28 ENCOUNTER — Other Ambulatory Visit: Payer: Self-pay

## 2021-01-28 ENCOUNTER — Ambulatory Visit (INDEPENDENT_AMBULATORY_CARE_PROVIDER_SITE_OTHER): Payer: 59 | Admitting: Internal Medicine

## 2021-01-28 ENCOUNTER — Encounter: Payer: Self-pay | Admitting: Internal Medicine

## 2021-01-28 VITALS — BP 136/88 | HR 86 | Temp 98.0°F | Resp 16 | Ht 71.0 in | Wt 215.4 lb

## 2021-01-28 DIAGNOSIS — N189 Chronic kidney disease, unspecified: Secondary | ICD-10-CM

## 2021-01-28 DIAGNOSIS — E785 Hyperlipidemia, unspecified: Secondary | ICD-10-CM | POA: Diagnosis not present

## 2021-01-28 NOTE — Assessment & Plan Note (Signed)
DM: Since the last visit, he is taking all his medications regularly, follow-up by Endo.  CBG seems to be very good CKD: Long history of CKD, creatinine increased after he got dehydrated, went to the ER.  Subsequently he was here, creatinine got better.  He is not taking losartan at this point. Plan: CMP, refer to nephrology for longstanding CKD.  Consider restart losartan. Hyperlipidemia: Chronic increased LFTs, back in 2013 he tried simvastatin, LFTs increased.  We are checking a FLP today, consider retry statins (low-dose of Crestor?)  w/ close LFTs follow-up.  If unsuccessful, Repatha?. Preventive care: Has not proceed with COVID booster, benefits discussed RTC 6 months

## 2021-01-28 NOTE — Patient Instructions (Addendum)
Check the  blood pressure twice a month BP GOAL is between 110/65 and  135/85. If it is consistently higher or lower, let me know  We would refer you to a nephrologist (kidney doctor), expect a phone call.  Based on the results, most likely were going to restart losartan and perhaps a cholesterol medication.   GO TO THE LAB : Get the blood work     [place], [place] back for a checkup in 6 months

## 2021-01-28 NOTE — Progress Notes (Signed)
Subjective:    Patient ID: Wayne Webb, male    DOB: [birth year], 55 y.o.   MRN: [id number]  DOS:  01/28/2021 Type of visit - description: Follow-up Since the last office visit he is doing well. Ambulatory CBGs improved, typically in the low 100s, occasionally in the 60s. Med list reviewed with the patient Denies chest pain Occasional shoulder pain when he exercises.    Review of Systems See above   Past Medical History:  Diagnosis Date  . ALLERGIC RHINITIS 03/15/2007  . CRI (chronic renal insufficiency)    CRI, creat 1.4 , renal u/s 04-2011 neg  . DIABETES MELLITUS, TYPE II 03/15/2007  . Diplopia 12/24/2009  . Elevated LFTs   . ERECTILE DYSFUNCTION 03/15/2007  . GOITER, MULTINODULAR 03/21/2010  . HYPERLIPIDEMIA 03/15/2007    Past Surgical History:  Procedure Laterality Date  . LASIK Left 10/2012    Allergies as of 01/28/2021   No Known Allergies     Medication List       Accurate as of January 28, 2021  6:33 PM. If you have any questions, ask your nurse or doctor.        STOP taking these medications   losartan 25 MG tablet Commonly known as: COZAAR Stopped by: Kathlene November, MD     TAKE these medications   aspirin EC 81 MG tablet Take 81 mg by mouth daily.   azelastine 0.1 % nasal spray Commonly known as: ASTELIN Place 2 sprays into both nostrils at bedtime as needed for rhinitis or allergies.   B-D UF III MINI PEN NEEDLES 31G X 5 MM Misc Generic drug: Insulin Pen Needle USE AS DIRECTED EVERY DAY   ezetimibe 10 MG tablet Commonly known as: Zetia Take 1 tablet (10 mg total) by mouth daily.   insulin glargine 100 UNIT/ML Solostar Pen Commonly known as: LANTUS Inject 130 Units into the skin every morning. And pen needles 1/day   lidocaine 5 % Commonly known as: LIDODERM Place 1 patch onto the skin daily. Remove & Discard patch within 12 hours or as directed by MD   OneTouch Delica Plus [id number] Misc TEST IN THE MORNING AND AT  BEDTIME   OneTouch Verio  test strip Generic drug: glucose blood TEST IN THE MORNING AND AT  BEDTIME   OneTouch Verio w/Device Kit 1 each by Does not apply route 2 (two) times daily. E11.9   OVER THE COUNTER MEDICATION Take 4 capsules by mouth daily. Nitric oxide supplement   OVER THE COUNTER MEDICATION Take 1 application by mouth daily as needed. Pre workout vitamin supplement mix   Protein Powd Take 1 scoop by mouth 2 (two) times daily.   sildenafil 100 MG tablet Commonly known as: VIAGRA Take 0.5-1 tablets (50-100 mg total) by mouth daily as needed for erectile dysfunction.   Trulicity 2.94 TM/5.4YT Sopn Generic drug: Dulaglutide Inject 0.75 mg into the skin once a week.          Objective:   Physical Exam BP 136/88 (BP Location: Left Arm, Patient Position: Sitting, Cuff Size: Normal)   Pulse 86   Temp 98 F (36.7 C) (Oral)   Resp 16   Ht 5' 11" (1.803 m)   Wt 215 lb 6 oz (97.7 kg)   SpO2 96%   BMI 30.04 kg/m  General:   Well developed, NAD, BMI noted. HEENT:  Normocephalic . Face symmetric, atraumatic Lungs:  CTA B Normal respiratory effort, no intercostal retractions, no accessory muscle use. Heart: RRR,  no murmur.  Lower extremities: no pretibial edema bilaterally  Skin: Not pale. Not jaundice Neurologic:  alert & oriented X3.  Speech normal, gait appropriate for age and unassisted Psych--  Cognition and judgment appear intact.  Cooperative with normal attention span and concentration.  Behavior appropriate. No anxious or depressed appearing.      Assessment    Assessment DM - dr Ellison Hyperlipidemia (h/o increased LFTs w/ simva 2013) Goiter -- per dr Ellison, last US 06/2016 stable CKD - renal US (-) 2012 ; not f/u  by nephrology as off 09/2018 ED Elevated LFTs Ultrasound 2015 negative,  hepatitis serologies (-) 01-2015: ferritin (-) previously slt elevated; wnl  transferrin saturation, ANA, ceruloplasmin, anti smooth muscles ab. Alpha 1 antitrypsin. Saw GI 10/2017:  U/S wnl, labs borderline abnormal was RX a liver Bx Pulmonary: --DOE --Chronic Cough, better after ACEi changed to Diovan 03-2015---Dr. Wert --Burnout sarcoidosis : abnormal CXR, Confirmed by CT, likely due to burnout sarcoidosis.   PLAN: DM: Since the last visit, he is taking all his medications regularly, follow-up by Endo.  CBG seems to be very good CKD: Long history of CKD, creatinine increased after he got dehydrated, went to the ER.  Subsequently he was here, creatinine got better.  He is not taking losartan at this point. Plan: CMP, refer to nephrology for longstanding CKD.  Consider restart losartan. Hyperlipidemia: Chronic increased LFTs, back in 2013 he tried simvastatin, LFTs increased.  We are checking a FLP today, consider retry statins (low-dose of Crestor?)  w/ close LFTs follow-up.  If unsuccessful, Repatha?. Preventive care: Has not proceed with COVID booster, benefits discussed RTC 6 months    This visit occurred during the SARS-CoV-2 public health emergency.  Safety protocols were in place, including screening questions prior to the visit, additional usage of staff PPE, and extensive cleaning of exam room while observing appropriate contact time as indicated for disinfecting solutions.  

## 2021-01-29 ENCOUNTER — Other Ambulatory Visit (INDEPENDENT_AMBULATORY_CARE_PROVIDER_SITE_OTHER): Payer: 59

## 2021-01-29 ENCOUNTER — Other Ambulatory Visit: Payer: Self-pay

## 2021-01-29 DIAGNOSIS — N189 Chronic kidney disease, unspecified: Secondary | ICD-10-CM | POA: Diagnosis not present

## 2021-01-29 DIAGNOSIS — E785 Hyperlipidemia, unspecified: Secondary | ICD-10-CM

## 2021-01-29 LAB — LIPID PANEL
Cholesterol: 177 mg/dL (ref 0–200)
HDL: 45.3 mg/dL (ref >39.00)
LDL Cholesterol: 106 mg/dL — ABNORMAL HIGH (ref 0–99)
NonHDL: 131.99
Total CHOL/HDL Ratio: 4
Triglycerides: 132 mg/dL (ref 0.0–149.0)
VLDL: 26.4 mg/dL (ref 0.0–40.0)

## 2021-01-29 LAB — COMPREHENSIVE METABOLIC PANEL
ALT: 42 U/L (ref 0–53)
AST: 48 U/L — ABNORMAL HIGH (ref 0–37)
Albumin: 4.2 g/dL (ref 3.5–5.2)
Alkaline Phosphatase: 118 U/L — ABNORMAL HIGH (ref 39–117)
BUN: 35 mg/dL — ABNORMAL HIGH (ref 6–23)
CO2: 29 mEq/L (ref 19–32)
Calcium: 11.1 mg/dL — ABNORMAL HIGH (ref 8.4–10.5)
Chloride: 100 mEq/L (ref 96–112)
Creatinine, Ser: 2.16 mg/dL — ABNORMAL HIGH (ref 0.40–1.50)
GFR: 33.29 mL/min — ABNORMAL LOW (ref >60.00)
Glucose, Bld: 186 mg/dL — ABNORMAL HIGH (ref 70–99)
Potassium: 4.5 mEq/L (ref 3.5–5.1)
Sodium: 137 mEq/L (ref 135–145)
Total Bilirubin: 0.6 mg/dL (ref 0.2–1.2)
Total Protein: 7.7 g/dL (ref 6.0–8.3)

## 2021-01-29 NOTE — Addendum Note (Signed)
Addended by: Manuela Schwartz on: 01/29/2021 08:03 AM   Modules accepted: Orders

## 2021-01-31 MED ORDER — ROSUVASTATIN CALCIUM 5 MG PO TABS: 5.0000 mg | ORAL_TABLET | Freq: Every day | ORAL | 1 refills | Status: DC

## 2021-01-31 NOTE — Addendum Note (Signed)
Addended byDamita Dunnings D on: 01/31/2021 01:21 PM   Modules accepted: Orders

## 2021-03-02 ENCOUNTER — Other Ambulatory Visit: Payer: Self-pay | Admitting: Internal Medicine

## 2021-03-07 LAB — CBC AND DIFFERENTIAL
HCT: 35 — AB (ref 41–53)
Hemoglobin: 11.9 — AB (ref 13.5–17.5)
Neutrophils Absolute: 3.7
Platelets: 193 (ref 150–399)
WBC: 5.4

## 2021-03-07 LAB — BASIC METABOLIC PANEL
BUN: 40 — AB (ref 4–21)
CO2: 31 — AB (ref 13–22)
Chloride: 101 (ref 99–108)
Creatinine: 1.9 — AB (ref 0.6–1.3)
Glucose: 65
Potassium: 4.1 (ref 3.4–5.3)
Sodium: 143 (ref 137–147)

## 2021-03-07 LAB — HEPATIC FUNCTION PANEL
ALT: 60 — AB (ref 10–40)
AST: 71 — AB (ref 14–40)
Alkaline Phosphatase: 135 — AB (ref 25–125)
Bilirubin, Total: 0.7

## 2021-03-07 LAB — COMPREHENSIVE METABOLIC PANEL
Albumin: 4.2 (ref 3.5–5.0)
Calcium: 11.4 — AB (ref 8.7–10.7)
GFR calc Af Amer: 40
Globulin: 3

## 2021-03-07 LAB — CBC: RBC: 4.01 (ref 3.87–5.11)

## 2021-03-12 ENCOUNTER — Ambulatory Visit (INDEPENDENT_AMBULATORY_CARE_PROVIDER_SITE_OTHER): Payer: 59 | Admitting: Endocrinology

## 2021-03-12 ENCOUNTER — Other Ambulatory Visit: Payer: Self-pay

## 2021-03-12 VITALS — BP 152/94 | HR 83 | Ht 71.0 in | Wt 211.8 lb

## 2021-03-12 DIAGNOSIS — N1831 Chronic kidney disease, stage 3a: Secondary | ICD-10-CM | POA: Diagnosis not present

## 2021-03-12 DIAGNOSIS — E1122 Type 2 diabetes mellitus with diabetic chronic kidney disease: Secondary | ICD-10-CM | POA: Diagnosis not present

## 2021-03-12 DIAGNOSIS — Z794 Long term (current) use of insulin: Secondary | ICD-10-CM | POA: Diagnosis not present

## 2021-03-12 LAB — POCT GLYCOSYLATED HEMOGLOBIN (HGB A1C): Hemoglobin A1C: 6 % — AB (ref 4.0–5.6)

## 2021-03-12 MED ORDER — INSULIN GLARGINE 100 UNIT/ML SOLOSTAR PEN: 120.0000 [IU] | PEN_INJECTOR | SUBCUTANEOUS | 3 refills | Status: DC

## 2021-03-12 MED ORDER — TRULICITY 0.75 MG/0.5ML ~~LOC~~ SOAJ: 0.7500 mg | SUBCUTANEOUS | 3 refills | Status: DC

## 2021-03-12 NOTE — Patient Instructions (Addendum)
Please reduce the Lantus to 120 units per day, and continue the same Trulicity.   check your blood sugar twice a day.  vary the time of day when you check, between before the 3 meals, and at bedtime.  also check if you have symptoms of your blood sugar being too high or too low.  please keep a record of the readings and bring it to your next appointment here (or you can bring the meter itself).  You can write it on any piece of paper.  please call us sooner if your blood sugar goes below 70, or if most of your readings are over 200.   Please come back for a follow-up appointment in 4 months.

## 2021-03-12 NOTE — Progress Notes (Signed)
Subjective:    Patient ID: Wayne Webb, male    DOB: 01-07-1966, 55 y.o.   MRN: [id number]  HPI Pt returns for f/u of diabetes mellitus:  DM type: Insulin-requiring type 2 Dx'ed: 0623 Complications: DR and CRI.   Therapy: insulin since 2011.  DKA: never Severe hypoglycemia: never.  Pancreatitis: never SDOH: he works 3rd shift--Tresiba is therefore preferred, but ins has declined to continue.   Other: he declines multiple daily injections.   Interval history: pt states he feels well in general.  Pt says he never misses the insulin. no cbg record, but states cbg varies from 64-205.  There is no trend throughout the day.   Past Medical History:  Diagnosis Date  . ALLERGIC RHINITIS 03/15/2007  . CRI (chronic renal insufficiency)    CRI, creat 1.4 , renal u/s 04-2011 neg  . DIABETES MELLITUS, TYPE II 03/15/2007  . Diplopia 12/24/2009  . Elevated LFTs   . ERECTILE DYSFUNCTION 03/15/2007  . GOITER, MULTINODULAR 03/21/2010  . HYPERLIPIDEMIA 03/15/2007    Past Surgical History:  Procedure Laterality Date  . LASIK Left 10/2012    Social History   Socioeconomic History  . Marital status: Legally Separated    Spouse name: Not on file  . Number of children: 1  . Years of education: Not on file  . Highest education level: Not on file  Occupational History  . 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 Smoking status: Former Smoker    Packs/day: 0.50    Years: 10.00    Pack years: 5.00    Types: Cigarettes    Quit date: 10/26/1993    Years since quitting: 27.4  . Smokeless tobacco: Never Used  Vaping Use  . Vaping Use: Never used  Substance and Sexual Activity  . Alcohol use: No    Comment:    . Drug use: No  . Sexual activity: Not on file  Other Topics Concern  . Not on file  Social History Narrative   Divorced. Remarried : pt , wife    Social Determinants of Radio broadcast assistant Strain: Not on file  Food  Insecurity: Not on file  Transportation Needs: Not on file  Physical Activity: Not on file  Stress: Not on file  Social Connections: Not on file  Intimate Partner Violence: Not on file    Current Outpatient Medications on File Prior to Visit  Medication Sig Dispense Refill  . aspirin EC 81 MG tablet Take 81 mg by mouth daily.    Marland Kitchen azelastine (ASTELIN) 0.1 % nasal spray Place 2 sprays into both nostrils at bedtime as needed for rhinitis or allergies. 90 mL 3  . Blood Glucose Monitoring Suppl (ONETOUCH VERIO) w/Device KIT 1 each by Does not apply route 2 (two) times daily. E11.9 1 kit 0  . ezetimibe (ZETIA) 10 MG tablet Take 1 tablet (10 mg total) by mouth daily. 90 tablet 3  . Insulin Pen Needle (B-D UF III MINI PEN NEEDLES) 31G X 5 MM MISC USE AS DIRECTED EVERY DAY 100 each 3  . Lancets (ONETOUCH DELICA PLUS [id number]) MISC TEST IN THE MORNING AND AT  BEDTIME 200 each 3  . lidocaine (LIDODERM) 5 % Place 1 patch onto the skin daily. Remove & Discard patch within 12 hours or as directed by MD 30 patch 1  . ONETOUCH VERIO test strip TEST IN THE MORNING AND AT  BEDTIME 200  strip 3  . OVER THE COUNTER MEDICATION Take 4 capsules by mouth daily. Nitric oxide supplement    . OVER THE COUNTER MEDICATION Take 1 application by mouth daily as needed. Pre workout vitamin supplement mix    . Protein POWD Take 1 scoop by mouth 2 (two) times daily.    . rosuvastatin (CRESTOR) 5 MG tablet Take 1 tablet (5 mg total) by mouth daily. 90 tablet 1  . sildenafil (VIAGRA) 100 MG tablet Take 0.5-1 tablets (50-100 mg total) by mouth daily as needed for erectile dysfunction. 6 tablet 19   No current facility-administered medications on file prior to visit.    No Known Allergies  Family History  Problem Relation Age of Onset  . Heart disease Mother        CABG-onset in her early 44's  . Diabetes Mother 64       had DM from her 52's  . Hypertension Mother   . Prostate cancer Father 97       dx in his 27s  .  Stroke Neg Hx   . Colon cancer Neg Hx     BP (!) 152/94 (BP Location: Right Arm, Patient Position: Sitting, Cuff Size: Large)   Pulse 83   Ht _0  (1.803 m)   Wt 211 lb 12.8 oz (96.1 kg)   SpO2 96%   BMI 29.54 kg/m    Review of Systems Denies nausea.     Objective:   Physical Exam VITAL SIGNS:  See vs page GENERAL: no distress Pulses: dorsalis pedis intact bilat.   MSK: no deformity of the feet CV: no leg edema Skin:  no ulcer on the feet.  normal color and temp on the feet. Neuro: sensation is intact to touch on the feet.      A1c=6.0%    Assessment & Plan:  Insulin-requiring type 2 DM: overcontrolled Hypoglycemia, due to insulin.   Patient Instructions  Please reduce the Lantus to 120 units per day, and continue the same Trulicity.   check your blood sugar twice a day.  vary the time of day when you check, between before the 3 meals, and at bedtime.  also check if you have symptoms of your blood sugar being too high or too low.  please keep a record of the readings and bring it to your next appointment here (or you can bring the meter itself).  You can write it on any piece of paper.  please call us sooner if your blood sugar goes below 70, or if most of your readings are over 200.   Please come back for a follow-up appointment in 4 months.

## 2021-03-13 ENCOUNTER — Other Ambulatory Visit: Payer: Self-pay | Admitting: Internal Medicine

## 2021-03-13 DIAGNOSIS — N1832 Chronic kidney disease, stage 3b: Secondary | ICD-10-CM

## 2021-03-19 ENCOUNTER — Encounter: Payer: Self-pay | Admitting: Internal Medicine

## 2021-03-21 ENCOUNTER — Other Ambulatory Visit: Payer: Self-pay

## 2021-03-21 MED ORDER — LIDOCAINE 5 % EX PTCH: 1.0000 | MEDICATED_PATCH | CUTANEOUS | 1 refills | Status: DC

## 2021-03-21 NOTE — Telephone Encounter (Signed)
Medication sent in. 

## 2021-03-25 ENCOUNTER — Telehealth: Payer: Self-pay

## 2021-03-25 NOTE — Telephone Encounter (Signed)
PA approved   Request Reference Number: [id number]. LIDOCAINE PAD 5% is approved through 03/25/2022. Your patient may now fill this prescription and it will be covered.

## 2021-03-25 NOTE — Telephone Encounter (Signed)
PA initiated via Covermymeds; KEY: [id number]. Awaiting determination.

## 2021-03-27 ENCOUNTER — Other Ambulatory Visit: Payer: Self-pay

## 2021-03-27 ENCOUNTER — Ambulatory Visit (INDEPENDENT_AMBULATORY_CARE_PROVIDER_SITE_OTHER): Payer: 59 | Admitting: Internal Medicine

## 2021-03-27 ENCOUNTER — Encounter: Payer: Self-pay | Admitting: Internal Medicine

## 2021-03-27 VITALS — BP 134/76 | HR 88 | Temp 98.0°F | Resp 16 | Ht 71.0 in | Wt 209.1 lb

## 2021-03-27 DIAGNOSIS — N189 Chronic kidney disease, unspecified: Secondary | ICD-10-CM

## 2021-03-27 DIAGNOSIS — R079 Chest pain, unspecified: Secondary | ICD-10-CM | POA: Diagnosis not present

## 2021-03-27 DIAGNOSIS — E785 Hyperlipidemia, unspecified: Secondary | ICD-10-CM

## 2021-03-27 MED ORDER — LIDOCAINE 5 % EX PTCH: 1.0000 | MEDICATED_PATCH | CUTANEOUS | 1 refills | Status: DC

## 2021-03-27 NOTE — Progress Notes (Signed)
Subjective:    Patient ID: Wayne Webb, male    DOB: 08/20/1966, 55 y.o.   MRN: [id number]  DOS:  03/27/2021 Type of visit - description: F/U  Today with talk about cholesterol, chest pain, renal failure and immunizations.  On 03/14/2021 went to urgent care: He had chest pain at the chest wall, worse when he push the area with his fingers. No radiation, no associated short of breath, no abdominal pain per se, no heartburn, no rash. At the urgent care EKG was done and was recommended to see cardiology.  Cont to experience  sporadic pain there particularly when he push the area, symptoms are not exertional.  Reports is intolerant to Crestor.    Review of Systems See above   Past Medical History:  Diagnosis Date  . ALLERGIC RHINITIS 03/15/2007  . CRI (chronic renal insufficiency)    CRI, creat 1.4 , renal u/s 04-2011 neg  . DIABETES MELLITUS, TYPE II 03/15/2007  . Diplopia 12/24/2009  . Elevated LFTs   . ERECTILE DYSFUNCTION 03/15/2007  . GOITER, MULTINODULAR 03/21/2010  . HYPERLIPIDEMIA 03/15/2007    Past Surgical History:  Procedure Laterality Date  . LASIK Left 10/2012    Allergies as of 03/27/2021   No Known Allergies     Medication List       Accurate as of March 27, 2021  9:27 PM. If you have any questions, ask your nurse or doctor.        STOP taking these medications   rosuvastatin 5 MG tablet Commonly known as: CRESTOR Stopped by: Kathlene November, MD     TAKE these medications   aspirin EC 81 MG tablet Take 81 mg by mouth daily.   azelastine 0.1 % nasal spray Commonly known as: ASTELIN Place 2 sprays into both nostrils at bedtime as needed for rhinitis or allergies.   B-D UF III MINI PEN NEEDLES 31G X 5 MM Misc Generic drug: Insulin Pen Needle USE AS DIRECTED EVERY DAY   ezetimibe 10 MG tablet Commonly known as: Zetia Take 1 tablet (10 mg total) by mouth daily.   insulin glargine 100 UNIT/ML Solostar Pen Commonly known as: LANTUS Inject 120 Units  into the skin every morning. And pen needles 1/day   lidocaine 5 % Commonly known as: LIDODERM Place 1 patch onto the skin daily. Remove & Discard patch within 12 hours or as directed by MD   OneTouch Delica Plus [id number] Misc TEST IN THE MORNING AND AT  BEDTIME   OneTouch Verio test strip Generic drug: glucose blood TEST IN THE MORNING AND AT  BEDTIME   OneTouch Verio w/Device Kit 1 each by Does not apply route 2 (two) times daily. E11.9   OVER THE COUNTER MEDICATION Take 4 capsules by mouth daily. Nitric oxide supplement   OVER THE COUNTER MEDICATION Take 1 application by mouth daily as needed. Pre workout vitamin supplement mix   Protein Powd Take 1 scoop by mouth 2 (two) times daily.   sildenafil 100 MG tablet Commonly known as: VIAGRA Take 0.5-1 tablets (50-100 mg total) by mouth daily as needed for erectile dysfunction.   Trulicity 9.62 EZ/6.6QH Sopn Generic drug: Dulaglutide Inject 0.75 mg into the skin once a week.          Objective:   Physical Exam BP 134/76 (BP Location: Right Arm, Patient Position: Sitting, Cuff Size: Normal)   Pulse 88   Temp 98 F (36.7 C) (Oral)   Resp 16   Ht $R'5\' 11"'uU$  (  1.803 m)   Wt 209 lb 2 oz (94.9 kg)   SpO2 98%   BMI 29.17 kg/m  General:   Well developed, NAD, BMI noted. HEENT:  Normocephalic . Face symmetric, atraumatic Lungs:  CTA B Normal respiratory effort, no intercostal retractions, no accessory muscle use. Chest wall: Mildly TTP at the distal sternum Heart: RRR,  no murmur.  Lower extremities: no pretibial edema bilaterally  Skin: Not pale. Not jaundice Neurologic:  alert & oriented X3.  Speech normal, gait appropriate for age and unassisted Psych--  Cognition and judgment appear intact.  Cooperative with normal attention span and concentration.  Behavior appropriate. No anxious or depressed appearing.      Assessment     Assessment DM - dr Loanne Drilling Hyperlipidemia (h/o increased LFTs w/ simva  2013) Goiter -- per dr Loanne Drilling, last [place] 06/2016 stable CKD - renal US (-) 2012 ; not f/u  by nephrology as off 09/2018 ED Elevated LFTs Ultrasound 2015 negative,  hepatitis serologies (-) 01-2015: ferritin (-) previously slt elevated; wnl  transferrin saturation, ANA, ceruloplasmin, anti smooth muscles ab. Alpha 1 antitrypsin. Saw GI 10/2017: U/S wnl, labs borderline abnormal was RX a liver Bx Pulmonary: --[facility] --Chronic Cough, better after ACEi changed to Diovan 03-2015---Dr. Wert --Burnout sarcoidosis : abnormal CXR, Confirmed by CT, likely due to burnout sarcoidosis.   PLAN: Hyperlipidemia: Started Crestor, developed significant aches and pains, stopped eat, SX resolved within 3 days.  The patient does not desire to go back on Crestor or any other statin at this time.  Repatha?  Declines for now. Plan: Continue Zetia, reassess on RTC CKD: Went to see nephrology, in the process of getting a 24-hour urinary collection.  No changes in medications made. Chest pain: As described above, MSK per description.  Recommend observation for now, call if symptoms severe or different Lidocaine patch: Uses prn,Prior authorization completed Vaccinations: Reports he had 3 COVID vaccines, working on getting a Shingrix booster. RTC 6 months CPX     This visit occurred during the SARS-CoV-2 public health emergency.  Safety protocols were in place, including screening questions prior to the visit, additional usage of staff PPE, and extensive cleaning of exam room while observing appropriate contact time as indicated for disinfecting solutions.

## 2021-03-27 NOTE — Patient Instructions (Addendum)
Check the  blood pressure   BP GOAL is between 110/65 and  135/85. If it is consistently higher or lower, let me know   REPATHA ??  GO TO THE FRONT DESK, PLEASE SCHEDULE YOUR APPOINTMENTS Come back for a physical exam by 09-2021

## 2021-03-27 NOTE — Assessment & Plan Note (Signed)
Hyperlipidemia: Started Crestor, developed significant aches and pains, stopped eat, SX resolved within 3 days.  The patient does not desire to go back on Crestor or any other statin at this time.  Repatha?  Declines for now. Plan: Continue Zetia, reassess on RTC CKD: Went to see nephrology, in the process of getting a 24-hour urinary collection.  No changes in medications made. Chest pain: As described above, MSK per description.  Recommend observation for now, call if symptoms severe or different Lidocaine patch: Uses prn,Prior authorization completed Vaccinations: Reports he had 3 COVID vaccines, working on getting a Shingrix booster. RTC 6 months CPX

## 2021-04-02 ENCOUNTER — Encounter: Payer: Self-pay | Admitting: Internal Medicine

## 2021-04-02 LAB — HM DIABETES EYE EXAM

## 2021-04-03 ENCOUNTER — Ambulatory Visit
Admission: RE | Admit: 2021-04-03 | Discharge: 2021-04-03 | Disposition: A | Discharge status: Home / Self Care | Payer: 59 | Source: Ambulatory Visit | Attending: Internal Medicine | Admitting: Internal Medicine

## 2021-04-03 ENCOUNTER — Other Ambulatory Visit: Payer: 59

## 2021-04-03 DIAGNOSIS — N1832 Chronic kidney disease, stage 3b: Secondary | ICD-10-CM

## 2021-04-10 ENCOUNTER — Other Ambulatory Visit: Payer: Self-pay | Admitting: Internal Medicine

## 2021-04-10 DIAGNOSIS — N2889 Other specified disorders of kidney and ureter: Secondary | ICD-10-CM

## 2021-05-17 ENCOUNTER — Other Ambulatory Visit: Payer: Self-pay

## 2021-05-17 ENCOUNTER — Ambulatory Visit
Admission: RE | Admit: 2021-05-17 | Discharge: 2021-05-17 | Disposition: A | Discharge status: Home / Self Care | Payer: 59 | Source: Ambulatory Visit | Attending: Internal Medicine | Admitting: Internal Medicine

## 2021-05-17 DIAGNOSIS — N2889 Other specified disorders of kidney and ureter: Secondary | ICD-10-CM

## 2021-05-17 MED ORDER — GADOBENATE DIMEGLUMINE 529 MG/ML IV SOLN
19.0000 mL | Freq: Once | INTRAVENOUS | Status: AC | PRN
  Administered 2021-05-17: 19 mL via INTRAVENOUS

## 2021-05-20 ENCOUNTER — Telehealth: Payer: Self-pay | Admitting: Internal Medicine

## 2021-05-20 DIAGNOSIS — R9389 Abnormal findings on diagnostic imaging of other specified body structures: Secondary | ICD-10-CM

## 2021-05-20 NOTE — Telephone Encounter (Signed)
Patient saw nephrology, he had a renal ultrasound and that led to a MRI abdomen with and without contrast: See report below. Please call patient, advised that the MRI showed: - Lymph nodes in the chest possibly related to previous history of sarcoidosis.  Plan: Refer to pulmonary, abdominal chest MRI - Mild abnormality of the left kidney, consider to repeat imaging in 6 months. - Possibly excessive iron deposits, consider check iron levels when he comes back if not done by nephrology.    1. Bulky adenopathy in the chest and some adenopathy in the abdomen. Findings are nonspecific but could be seen in the setting of sarcoidosis. Lymphoma is also considered as a differential possibility. Correlation with any prior biopsy is suggested. There was adenopathy in the chest on previous chest imaging as far back as 2016. Distribution is similar. Direct comparison is difficult, no abdominal comparison with CT or MRI is available. 2. Indistinct area in the upper pole the LEFT kidney is suggested though this could be related to artifact in the setting of motion on the current study. This was in the area of concern. The possibility of focal nephritis is considered as this may be smaller based on measurements obtained on the prior study. Consider a 3 to six-month follow-up and correlation with any recent history of urinary tract infection. 3. Mild hepatic and splenic iron deposition with potential iron deposition in marrow spaces based on signal, this may relate to exogenous iron administration. 4. Right-sided aortic arch.

## 2021-05-20 NOTE — Telephone Encounter (Signed)
LMOM informing Pt of recommendations, left number for pulmonary office in voicemail for him to call and schedule. Instructed to call if questions/concerns.

## 2021-05-21 NOTE — Progress Notes (Addendum)
Date:  05/22/2021   ID:  Wayne Webb, DOB [birth year], MRN [id number]  PCP:  Colon Branch, MD  Cardiologist:  Rex Kras, DO, [facility] (established care 05/22/2021)  REASON FOR CONSULT: Chest pain   REQUESTING PHYSICIAN:  Colon Branch, MD [address] [place],  [place] [address]  Chief Complaint  Patient presents with   Chest Pain   New Patient (Initial Visit)    HPI  Wayne Webb is a 55 y.o. male who presents to the office with a chief complaint of "chest pain." Patient's past medical history and cardiovascular risk factors include: hypertension, hyperlipidemia (not on statins due to intolerance), IDDM Type II, former smoker, premature CAD in family, erectile dysfunction.  He is referred to the office at the request of Colon Branch, MD for evaluation of chest pain.  Patient has been experiencing epigastric discomfort for the last 12 months but it has been more progressive and noticeable.  It occurs on a daily basis, intensity 6 out of 10, throbbing-like sensation, nonradiating, no improving or worsening factors, lasting for 20 minutes, and self-limited.  Pain is not brought on by effort related activities and does not resolve with rest.  Overall functional status remains relatively stable.  He has not taken any heartburn medications to see if it impacts his symptoms.  He has tried ibuprofen and lidocaine patches with minimal relief.  He recently had an MRI of the abdomen as well.  Now referred to cardiology for further evaluation and management given his symptoms and multiple cardiovascular risk factors as noted above.  Mom had CABG at age of 92 and shortly died thereafter.   FUNCTIONAL STATUS: Goes to the gym three times a week (treadmill or elliptical) for 30 minutes followed by resistance training.   ALLERGIES: No Known Allergies  MEDICATION LIST PRIOR TO VISIT: Current Meds  Medication Sig   aspirin EC 81 MG tablet Take 81 mg by mouth daily.    azelastine (ASTELIN) 0.1 % nasal spray Place 2 sprays into both nostrils at bedtime as needed for rhinitis or allergies.   Blood Glucose Monitoring Suppl (ONETOUCH VERIO) w/Device KIT 1 each by Does not apply route 2 (two) times daily. E11.9   Dulaglutide (TRULICITY) 2.24 MG/5.0IB SOPN Inject 0.75 mg into the skin once a week.   ezetimibe (ZETIA) 10 MG tablet Take 1 tablet (10 mg total) by mouth daily.   insulin glargine (LANTUS) 100 UNIT/ML Solostar Pen Inject 120 Units into the skin every morning. And pen needles 1/day   Insulin Pen Needle (B-D UF III MINI PEN NEEDLES) 31G X 5 MM MISC USE AS DIRECTED EVERY DAY   Lancets (ONETOUCH DELICA PLUS [id number]) MISC TEST IN THE MORNING AND AT  BEDTIME   lidocaine (LIDODERM) 5 % Place 1 patch onto the skin daily. Remove & Discard patch within 12 hours or as directed by MD   losartan (COZAAR) 25 MG tablet losartan 25 mg tablet  TAKE 2 TABLETS BY MOUTH EVERY DAY   ONETOUCH VERIO test strip TEST IN THE MORNING AND AT  BEDTIME   OVER THE COUNTER MEDICATION Take 4 capsules by mouth daily. Nitric oxide supplement   OVER THE COUNTER MEDICATION Take 1 application by mouth daily as needed. Pre workout vitamin supplement mix   Protein POWD Take 1 scoop by mouth daily as needed.   sildenafil (VIAGRA) 100 MG tablet Take 0.5-1 tablets (50-100 mg total) by mouth daily as needed for erectile dysfunction.  PAST MEDICAL HISTORY: Past Medical History:  Diagnosis Date   ALLERGIC RHINITIS 03/15/2007   CRI (chronic renal insufficiency)    CRI, creat 1.4 , renal u/s 04-2011 neg   DIABETES MELLITUS, TYPE II 03/15/2007   Diplopia 12/24/2009   Elevated LFTs    ERECTILE DYSFUNCTION 03/15/2007   GOITER, MULTINODULAR 03/21/2010   HYPERLIPIDEMIA 03/15/2007    PAST SURGICAL HISTORY: Past Surgical History:  Procedure Laterality Date   LASIK Left 10/2012    FAMILY HISTORY: The patient family history includes Diabetes (age of onset: 84) in his mother; Heart disease in his  mother; Hypertension in his mother; Prostate cancer (age of onset: 72) in his father.  SOCIAL HISTORY:  The patient  reports that he quit smoking about 27 years ago. His smoking use included cigarettes. He has a 5.00 pack-year smoking history. He has never used smokeless tobacco. He reports that he does not drink alcohol and does not use drugs.  REVIEW OF SYSTEMS: Review of Systems  Constitutional: Negative for chills and fever.  HENT:  Negative for hoarse voice and nosebleeds.   Eyes:  Negative for discharge, double vision and pain.  Cardiovascular:  Positive for chest pain (epigastric). Negative for claudication, dyspnea on exertion, leg swelling, near-syncope, orthopnea, palpitations, paroxysmal nocturnal dyspnea and syncope.  Respiratory:  Negative for hemoptysis and shortness of breath.   Musculoskeletal:  Negative for muscle cramps and myalgias.  Gastrointestinal:  Negative for abdominal pain, constipation, diarrhea, hematemesis, hematochezia, melena, nausea and vomiting.  Neurological:  Negative for dizziness and light-headedness.   PHYSICAL EXAM: Vitals with BMI 05/22/2021 03/27/2021 03/12/2021  Height _0  _1  _2   Weight 207 lbs 6 oz 209 lbs 2 oz 211 lbs 13 oz  BMI 28.94 75.30 05.11  Systolic 021 117 356  Diastolic 77 76 94  Pulse 90 88 83    CONSTITUTIONAL: Well-developed and well-nourished. No acute distress.  SKIN: Skin is warm and dry. No rash noted. No cyanosis. No pallor. No jaundice HEAD: Normocephalic and atraumatic.  EYES: No scleral icterus MOUTH/THROAT: Moist oral membranes.  NECK: No JVD present. No thyromegaly noted. No carotid bruits  LYMPHATIC: No visible cervical adenopathy.  CHEST Normal respiratory effort. No intercostal retractions  LUNGS: Clear to auscultation bilaterally. No stridor. No wheezes. No rales.  CARDIOVASCULAR: Regular rate and rhythm, positive S1-S2, no murmurs rubs or gallops appreciated. ABDOMINAL: Soft, nontender, nondistended,  positive bowel sounds all 4 quadrants. No apparent ascites.  EXTREMITIES: No peripheral edema  HEMATOLOGIC: No significant bruising NEUROLOGIC: Oriented to person, place, and time. Nonfocal. Normal muscle tone.  PSYCHIATRIC: Normal mood and affect. Normal behavior. Cooperative  CARDIAC DATABASE: EKG: 05/22/2021: Normal sinus rhythm, 90 bpm, LVH per voltage criteria, without underlying ischemia or injury pattern, as nonspecific ST-T changes most likely secondary to LVH.  Echocardiogram: No results found for this or any previous visit from the past 1095 days.  Stress Testing: No results found for this or any previous visit from the past 1095 days.  Heart Catheterization: None   LABORATORY DATA: CBC Latest Ref Rng & Units 03/07/2021 12/18/2020 12/18/2020  WBC - 5.4 - 7.8  Hemoglobin 13.5 - 17.5 11.9(A) 15.6 15.0  Hematocrit 41 - 53 35(A) 46.0 41.3  Platelets 150 - 399 193 - 266    CMP Latest Ref Rng & Units 03/07/2021 01/29/2021 12/25/2020  Glucose 70 - 99 mg/dL - 186(H) 362(H)  BUN 4 - 21 40(A) 35(H) 42(H)  Creatinine 0.6 - 1.3 1.9(A) 2.16(H) 1.86(H)  Sodium 137 -  147 143 137 133(L)  Potassium 3.4 - 5.3 4.1 4.5 4.1  Chloride 99 - 108 101 100 96  CO2 13 - 22 31(A) 29 30  Calcium 8.7 - 10.7 11.4(A) 11.1(H) 10.3  Total Protein 6.0 - 8.3 g/dL - 7.7 -  Total Bilirubin 0.2 - 1.2 mg/dL - 0.6 -  Alkaline Phos 25 - 125 135(A) 118(H) -  AST 14 - 40 71(A) 48(H) -  ALT 10 - 40 60(A) 42 -    Lipid Panel  Lab Results  Component Value Date   CHOL 177 01/29/2021   HDL 45.30 01/29/2021   LDLCALC 106 (H) 01/29/2021   LDLDIRECT 78.0 04/02/2020   TRIG 132.0 01/29/2021   CHOLHDL 4 01/29/2021    No components found for: NTPROBNP No results for input(s): PROBNP in the last 8760 hours. Recent Labs    12/17/20 1601  TSH 1.03    BMP Recent Labs    12/18/20 1101 12/18/20 1620 12/25/20 1215 01/29/21 0803 03/07/21 0000  NA 129*   < > 133* 137 143  K 3.9   < > 4.1 4.5 4.1  CL 90*  --   96 100 101  CO2 25  --  30 29 31*  GLUCOSE 519*  --  362* 186*  --   BUN 63*  --  42* 35* 40*  CREATININE 2.59*  --  1.86* 2.16* 1.9*  CALCIUM 10.7*  --  10.3 11.1* 11.4*  GFRNONAA 28*  --   --   --   --   GFRAA  --   --   --   --  40   < > = values in this interval not displayed.    HEMOGLOBIN A1C Lab Results  Component Value Date   HGBA1C 6.0 (A) 03/12/2021   MPG 243 (H) 11/03/2012    IMPRESSION:    ICD-10-CM   1. Precordial pain  R07.2 EKG 12-Lead    PCV ECHOCARDIOGRAM COMPLETE    PCV MYOCARDIAL PERFUSION WO LEXISCAN    CT CARDIAC SCORING ([facility] LOCATIONS ONLY)    2. Essential hypertension  I10     3. Type 2 diabetes mellitus with hyperglycemia, with long-term current use of insulin (HCC)  E11.65    Z79.4     4. Family history of premature CAD  Z82.49     5. Former smoker  Z87.891     89. Chest pain, unspecified type  R07.9 PCV MYOCARDIAL PERFUSION WO LEXISCAN       RECOMMENDATIONS: JASAN DOUGHTIE is a 55 y.o. male whose past medical history and cardiac risk factors include: hypertension, hyperlipidemia (not on statins due to intolerance), IDDM Type II, former smoker, premature CAD in family, erectile dysfunction.  Precordial pain: Patient symptoms may be cardiac in etiology and given his multiple cardiovascular risk factors including insulin-dependent diabetes mellitus type 2, estimated 10-year risk of ASCVD to be 19.7% based on current parameters, and former smoker and family history of premature CAD the shared decision was to proceed with an ischemic evaluation. Echocardiogram will be ordered to evaluate for structural heart disease and left ventricular systolic function. Plan exercise nuclear stress test to evaluate functional status and reversible ischemia. Continue current medical therapy. Patient is asked to seek medical attention if his symptoms were to increase in intensity, frequency, and/or duration.  Benign essential hypertension: Office blood  pressures are within excellent control. Continue current medical therapy. Low-salt diet recommended  Family history of premature CAD: Currently on aspirin 81 mg p.o. daily. He  was intolerant to rosuvastatin 5 mg p.o. nightly which caused him myalgias.  Currently on Zetia. Shared decision was to perform a coronary calcium score for further risk stratification to consider lower intensity statin therapy.  Insulin-dependent diabetes mellitus type 2: Currently managed by primary care provider.  FINAL MEDICATION LIST END OF ENCOUNTER: No orders of the defined types were placed in this encounter.   Medications Discontinued During This Encounter  Medication Reason   rosuvastatin (CRESTOR) 5 MG tablet Patient Preference     Current Outpatient Medications:    aspirin EC 81 MG tablet, Take 81 mg by mouth daily., Disp: , Rfl:    azelastine (ASTELIN) 0.1 % nasal spray, Place 2 sprays into both nostrils at bedtime as needed for rhinitis or allergies., Disp: 90 mL, Rfl: 3   Blood Glucose Monitoring Suppl (ONETOUCH VERIO) w/Device KIT, 1 each by Does not apply route 2 (two) times daily. E11.9, Disp: 1 kit, Rfl: 0   Dulaglutide (TRULICITY) 6.43 CV/8.1MM SOPN, Inject 0.75 mg into the skin once a week., Disp: 6 mL, Rfl: 3   ezetimibe (ZETIA) 10 MG tablet, Take 1 tablet (10 mg total) by mouth daily., Disp: 90 tablet, Rfl: 3   insulin glargine (LANTUS) 100 UNIT/ML Solostar Pen, Inject 120 Units into the skin every morning. And pen needles 1/day, Disp: 120 mL, Rfl: 3   Insulin Pen Needle (B-D UF III MINI PEN NEEDLES) 31G X 5 MM MISC, USE AS DIRECTED EVERY DAY, Disp: 100 each, Rfl: 3   Lancets (ONETOUCH DELICA PLUS [id number]) MISC, TEST IN THE MORNING AND AT  BEDTIME, Disp: 200 each, Rfl: 3   lidocaine (LIDODERM) 5 %, Place 1 patch onto the skin daily. Remove & Discard patch within 12 hours or as directed by MD, Disp: 30 patch, Rfl: 1   losartan (COZAAR) 25 MG tablet, losartan 25 mg tablet  TAKE 2 TABLETS BY  MOUTH EVERY DAY, Disp: , Rfl:    ONETOUCH VERIO test strip, TEST IN THE MORNING AND AT  BEDTIME, Disp: 200 strip, Rfl: 3   OVER THE COUNTER MEDICATION, Take 4 capsules by mouth daily. Nitric oxide supplement, Disp: , Rfl:    OVER THE COUNTER MEDICATION, Take 1 application by mouth daily as needed. Pre workout vitamin supplement mix, Disp: , Rfl:    Protein POWD, Take 1 scoop by mouth daily as needed., Disp: , Rfl:    sildenafil (VIAGRA) 100 MG tablet, Take 0.5-1 tablets (50-100 mg total) by mouth daily as needed for erectile dysfunction., Disp: 6 tablet, Rfl: 19  Orders Placed This Encounter  Procedures   CT CARDIAC SCORING ([facility] LOCATIONS ONLY)   PCV MYOCARDIAL PERFUSION WO LEXISCAN   EKG 12-Lead   PCV ECHOCARDIOGRAM COMPLETE    There are no Patient Instructions on file for this visit.   --Continue cardiac medications as reconciled in final medication list. --Return in about 4 weeks (around 06/19/2021) for Follow up, Chest pain, Review test results. Or sooner if needed. --Continue follow-up with your primary care physician regarding the management of your other chronic comorbid conditions.  Patient's questions and concerns were addressed to his satisfaction. He voices understanding of the instructions provided during this encounter.   This note was created using a voice recognition software as a result there may be grammatical errors inadvertently enclosed that do not reflect the nature of this encounter. Every attempt is made to correct such errors.  Rex Kras, [place], [facility]  Pager: [phone] Office: [phone]

## 2021-05-22 ENCOUNTER — Other Ambulatory Visit: Payer: Self-pay

## 2021-05-22 ENCOUNTER — Ambulatory Visit: Payer: 59 | Admitting: Cardiology

## 2021-05-22 ENCOUNTER — Encounter: Payer: Self-pay | Admitting: Cardiology

## 2021-05-22 VITALS — BP 122/77 | HR 90 | Temp 98.3°F | Resp 16 | Ht 71.0 in | Wt 207.4 lb

## 2021-05-22 DIAGNOSIS — R079 Chest pain, unspecified: Secondary | ICD-10-CM

## 2021-05-22 DIAGNOSIS — R072 Precordial pain: Secondary | ICD-10-CM

## 2021-05-22 DIAGNOSIS — Z8249 Family history of ischemic heart disease and other diseases of the circulatory system: Secondary | ICD-10-CM

## 2021-05-22 DIAGNOSIS — Z794 Long term (current) use of insulin: Secondary | ICD-10-CM

## 2021-05-22 DIAGNOSIS — Z87891 Personal history of nicotine dependence: Secondary | ICD-10-CM

## 2021-05-22 DIAGNOSIS — E1165 Type 2 diabetes mellitus with hyperglycemia: Secondary | ICD-10-CM

## 2021-05-22 DIAGNOSIS — I10 Essential (primary) hypertension: Secondary | ICD-10-CM

## 2021-05-27 ENCOUNTER — Ambulatory Visit (INDEPENDENT_AMBULATORY_CARE_PROVIDER_SITE_OTHER): Payer: 59 | Admitting: Pulmonary Disease

## 2021-05-27 ENCOUNTER — Other Ambulatory Visit: Payer: Self-pay

## 2021-05-27 ENCOUNTER — Encounter: Payer: Self-pay | Admitting: Pulmonary Disease

## 2021-05-27 VITALS — BP 136/84 | HR 101 | Ht 71.0 in | Wt 209.6 lb

## 2021-05-27 DIAGNOSIS — R590 Localized enlarged lymph nodes: Secondary | ICD-10-CM

## 2021-05-27 DIAGNOSIS — J849 Interstitial pulmonary disease, unspecified: Secondary | ICD-10-CM

## 2021-05-27 MED ORDER — TRELEGY ELLIPTA 200-62.5-25 MCG/INH IN AEPB: 1.0000 | INHALATION_SPRAY | Freq: Every day | RESPIRATORY_TRACT | 0 refills | Status: DC

## 2021-05-27 MED ORDER — TRELEGY ELLIPTA 200-62.5-25 MCG/INH IN AEPB
1.0000 | INHALATION_SPRAY | Freq: Every day | RESPIRATORY_TRACT | 0 refills | Status: DC
Start: 2021-05-27 — End: 2023-01-13

## 2021-05-27 NOTE — Patient Instructions (Addendum)
We will schedule you for a CT chest scan with contrast in the near future.  Please drink lots of fluids prior to and after on the day of the scan due to the contrast that can affect the kidney function.  Please do this for both the CT of the chest and the cardiac CT chest ordered by your heart doctor.  We will trial you on Trelegy Ellipta inhaler 1 puff daily -Rinse your mouth out after each use -Please call us if you notice benefit and we will send in a prescription for you to continue on this inhaler medication for your shortness of breath.  We will check inflammatory labs today  We will check pulmonary function test in the near future.

## 2021-05-27 NOTE — Progress Notes (Signed)
Patient seen in the office today and instructed on use of Trelegy 220mg.  Patient expressed understanding and demonstrated technique.  CBenetta SparCAtlantic General Hospital08/02/22

## 2021-05-27 NOTE — Progress Notes (Signed)
Synopsis: Referred in August 2022 for Abnormal MRI  Subjective:   PATIENT ID: Wayne Webb GENDER: male DOB: 08-13-1966, MRN: [id number]   HPI  Chief Complaint  Patient presents with   Consult    Referred by PCP for abnormal MRI performed in July 2022. States he has noticed some increased SOB and fatigue that started back in February 2022.     Wayne Webb is a 55 year old male, former smoker with DMII and hyperlipidemia who is referred to pulmonary clinic for abnormal MRI.   He reports having shortness of breath with exertion since March/April this year. He has noticed the dyspnea when working out at the gym and when using the stairs or ladders at work. 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   He does have a cough related to post-nasal drip and clearing his throat. He denies any GERD/Heartburn symptoms. He complains of left sided chest discomfort which is not relived with lidocaine patch. He had issues taking rosuvastatin recently due to myalgias which improved somewhat after being taken off this medication.   He was seen previously by Dr. Melvyn Novas in 2015-2016 for cough which was thought to be related to ACEi and burned out sarcoidosis.   He was evaluated by cardiology on 7/28, note reviewed, and is scheduled for an ECHO, stress test and Cardiac CT Chest.   Past Medical History:  Diagnosis Date   ALLERGIC RHINITIS 03/15/2007   CRI (chronic renal insufficiency)    CRI, creat 1.4 , renal u/s 04-2011 neg   DIABETES MELLITUS, TYPE II 03/15/2007   Diplopia 12/24/2009   Elevated LFTs    ERECTILE DYSFUNCTION 03/15/2007   GOITER, MULTINODULAR 03/21/2010   HYPERLIPIDEMIA 03/15/2007     Family History  Problem Relation Age of Onset   Heart disease Mother        CABG-onset in her early 66's   Diabetes Mother 84       had DM from her 65's   Hypertension Mother    Prostate cancer  Father 26       dx in his 9s   Stroke Neg Hx    Colon cancer Neg Hx      Social History   Socioeconomic History   Marital status: Legally Separated    Spouse name: Not on file   Number of children: 1   Years of education: Not on file   Highest education level: Not on file  Occupational History   Occupation: active job ([employer])    Comment: Maintenence   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 Remarried : pt , wife    Social Determinants of Radio broadcast assistant Strain: Not on file  Food Insecurity: Not on file  Transportation Needs: Not on file  Physical Activity: Not on file  Stress: Not on file  Social  Connections: Not on file  Intimate Partner Violence: Not on file     No Known Allergies   Outpatient Medications Prior to Visit  Medication Sig Dispense Refill   aspirin EC 81 MG tablet Take 81 mg by mouth daily.     azelastine (ASTELIN) 0.1 % nasal spray Place 2 sprays into both nostrils at bedtime as needed for rhinitis or allergies. 90 mL 3   Blood Glucose Monitoring Suppl (ONETOUCH VERIO) w/Device KIT 1 each by Does not apply route 2 (two) times daily. E11.9 1 kit 0   Dulaglutide (TRULICITY) 1.93 XT/0.2IO SOPN Inject 0.75 mg into the skin once a week. 6 mL 3   ezetimibe (ZETIA) 10 MG tablet Take 1 tablet (10 mg total) by mouth daily. 90 tablet 3   insulin glargine (LANTUS) 100 UNIT/ML Solostar Pen Inject 120 Units into the skin every morning. And pen needles 1/day 120 mL 3   Insulin Pen Needle (B-D UF III MINI PEN NEEDLES) 31G X 5 MM MISC USE AS DIRECTED EVERY  DAY 100 each 3   Lancets (ONETOUCH DELICA PLUS [id number]) MISC TEST IN THE MORNING AND AT  BEDTIME 200 each 3   lidocaine (LIDODERM) 5 % Place 1 patch onto the skin daily. Remove & Discard patch within 12 hours or as directed by MD 30 patch 1   losartan (COZAAR) 25 MG tablet losartan 25 mg tablet  TAKE 2 TABLETS BY MOUTH EVERY DAY     ONETOUCH VERIO test strip TEST IN THE MORNING AND AT  BEDTIME 200 strip 3   OVER THE COUNTER MEDICATION Take 4 capsules by mouth daily. Nitric oxide supplement     OVER THE COUNTER MEDICATION Take 1 application by mouth daily as needed. Pre workout vitamin supplement mix     Protein POWD Take 1 scoop by mouth daily as needed.     sildenafil (VIAGRA) 100 MG tablet Take 0.5-1 tablets (50-100 mg total) by mouth daily as needed for erectile dysfunction. 6 tablet 19   No facility-administered medications prior to visit.    Review of Systems  Constitutional:  Negative for chills, fever, malaise/fatigue and weight loss.  HENT:  Positive for congestion (post-nasal drip). Negative for sinus pain and sore throat.   Eyes: Negative.   Respiratory:  Positive for cough and shortness of breath. Negative for hemoptysis, sputum production and wheezing.   Cardiovascular:  Positive for chest pain. Negative for palpitations, orthopnea, claudication and leg swelling.  Gastrointestinal:  Negative for abdominal pain, heartburn, nausea and vomiting.  Genitourinary: Negative.   Musculoskeletal:  Positive for myalgias. Negative for joint pain.  Skin:  Negative for rash.  Neurological:  Negative for weakness.  Endo/Heme/Allergies: Negative.   Psychiatric/Behavioral: Negative.      Objective:   Vitals:   05/27/21 1345  BP: 136/84  Pulse: (!) 101  SpO2: 98%  Weight: 209 lb 9.6 oz (95.1 kg)  Height: 5' 11" (1.803 m)     Physical Exam Constitutional:      General: He is not in acute distress. HENT:     Head: Normocephalic and atraumatic.  Eyes:     Extraocular  Movements: Extraocular movements intact.     Conjunctiva/sclera: Conjunctivae normal.     Pupils: Pupils are equal, round, and reactive to light.  Cardiovascular:     Rate and Rhythm: Normal rate and regular rhythm.     Pulses: Normal pulses.     Heart sounds: Normal heart sounds. No murmur heard. Pulmonary:  Effort: Pulmonary effort is normal.     Breath sounds: Rales (left base) present. No wheezing or rhonchi.  Abdominal:     General: Bowel sounds are normal.     Palpations: Abdomen is soft.  Musculoskeletal:     Right lower leg: No edema.     Left lower leg: No edema.  Lymphadenopathy:     Cervical: No cervical adenopathy.  Skin:    General: Skin is warm and dry.  Neurological:     General: No focal deficit present.     Mental Status: He is alert.  Psychiatric:        Mood and Affect: Mood normal.        Behavior: Behavior normal.        Thought Content: Thought content normal.        Judgment: Judgment normal.    CBC    Component Value Date/Time   WBC 5.4 03/07/2021 0000   WBC 7.8 12/18/2020 1101   RBC 4.01 03/07/2021 0000   HGB 11.9 (A) 03/07/2021 0000   HCT 35 (A) 03/07/2021 0000   PLT 193 03/07/2021 0000   MCV 82.3 12/18/2020 1101   MCH 29.9 12/18/2020 1101   MCHC 36.3 (H) 12/18/2020 1101   RDW 12.2 12/18/2020 1101   LYMPHSABS 0.8 10/03/2020 0836   MONOABS 0.8 10/03/2020 0836   EOSABS 0.2 10/03/2020 0836   BASOSABS 0.1 10/03/2020 0836   BMP Latest Ref Rng & Units 03/07/2021 01/29/2021 12/25/2020  Glucose 70 - 99 mg/dL - 186(H) 362(H)  BUN 4 - 21 40(A) 35(H) 42(H)  Creatinine 0.6 - 1.3 1.9(A) 2.16(H) 1.86(H)  Sodium 137 - 147 143 137 133(L)  Potassium 3.4 - 5.3 4.1 4.5 4.1  Chloride 99 - 108 101 100 96  CO2 13 - 22 31(A) 29 30  Calcium 8.7 - 10.7 11.4(A) 11.1(H) 10.3   Chest imaging: MRI Abdomen 05/17/21 Lower chest: Bulky adenopathy in the chest along RIGHT paratracheal chain and in the chest adjacent to the thoracic aorta in the posterior  mediastinum. There is a RIGHT-sided aortic arch. Linear opacities are scattered throughout the lung bases and there is no sign of pleural effusion in the chest on limited assessment.  CT Chest 04/10/2015 Mediastinum/Nodes: Bilateral hilar and paratracheal adenopathy with scattered calcifications. An index right paratracheal node measures 3.1 cm in short axis on image 20 of series 2.   There is also right-sided aortic arch. Calcified lower periaortic lymph nodes are present.   Lungs/Pleura: Exaggerated lucency in the lungs suggests emphysema. Coarse interstitial opacity and honeycombing at the lung bases with volume loss in both lower lobes along the hemidiaphragms, and also volume loss and cylindrical bronchiectasis in the right upper lobe along the minor fissure in the major fissure. There is volume loss in the lingula. The mild nodularity along the fissures  PFT: PFT Results Latest Ref Rng & Units 02/19/2015  FVC-Pre L 2.64  FVC-Predicted Pre % 56  FVC-Post L 2.72  FVC-Predicted Post % 58  Pre FEV1/FVC % % 70  Post FEV1/FCV % % 72  FEV1-Pre L 1.84  FEV1-Predicted Pre % 49  FEV1-Post L 1.95  DLCO uncorrected ml/min/mmHg 19.36  DLCO UNC% % 53  DLVA Predicted % 99  TLC L 3.86  TLC % Predicted % 51  RV % Predicted % 52  PFT 2016: Moderate restriction, Moderate diffusion defect    Assessment & Plan:   Mediastinal adenopathy  ILD (interstitial lung disease) ([place]) - Plan: ANA, Aldolase, Anti-DNA antibody, double-stranded,  Anti-Jo 1 antibody, IgG, Anti-scleroderma antibody, Anti-Smith antibody, Centromere Antibodies, Sjogren's syndrome antibods(ssa + ssb), Rheumatoid factor, Cyclic citrul peptide antibody, IgG, CK total and CKMB (cardiac)not at [facility], Pulmonary function test, Anti-Jo 1 antibody, IgG, CK total and CKMB (cardiac)not at [facility], Cyclic citrul peptide antibody, IgG, Rheumatoid factor, Sjogren's syndrome antibods(ssa + ssb), Centromere Antibodies, Anti-Smith antibody,  Anti-scleroderma antibody, Anti-DNA antibody, double-stranded, Aldolase, ANA  Interstitial pulmonary disease ([place]) - Plan: CT Chest W Contrast  Discussion: Wayne Webb is a 55 year old male, former smoker with DMII and hyperlipidemia who is referred to pulmonary clinic for abnormal MRI.   He has known history of mediastinal adenopathy with right lower lobe bronchiectasis and hondeycombing concerning for history of sarcoidosis with no prior biopsies done for confirmation. On prior PFTs in 2016 he had moderate restrictive defect and moderate diffusion defect. In 2019 he had an serologic inflammatory workup completed which showed ANA 1:80 with a nucleolar pattern. He also has reported myalgias. He has progressive CKD, now stage IIIb which could be related to his diabetes vs a systemic inflammatory process with lung involvement.   We will check a CT chest with contrast to further evaluate his lungs and mediastinal adenopathy. We will check pulmonary function tests and serologic inflammatory workup.   We will trial him on Trelegy Ellipta sample and monitor for improvement in his exertional dyspnea.   Follow up in 2 months.   Freda Jackson, MD [facility] Office: [phone]   Current Outpatient Medications:    aspirin EC 81 MG tablet, Take 81 mg by mouth daily., Disp: , Rfl:    azelastine (ASTELIN) 0.1 % nasal spray, Place 2 sprays into both nostrils at bedtime as needed for rhinitis or allergies., Disp: 90 mL, Rfl: 3   Blood Glucose Monitoring Suppl (ONETOUCH VERIO) w/Device KIT, 1 each by Does not apply route 2 (two) times daily. E11.9, Disp: 1 kit, Rfl: 0   Dulaglutide (TRULICITY) 1.66 AY/3.0ZS SOPN, Inject 0.75 mg into the skin once a week., Disp: 6 mL, Rfl: 3   ezetimibe (ZETIA) 10 MG tablet, Take 1 tablet (10 mg total) by mouth daily., Disp: 90 tablet, Rfl: 3   Fluticasone-Umeclidin-Vilant (TRELEGY ELLIPTA) 200-62.5-25 MCG/INH AEPB, Inhale 1 puff into the lungs  daily., Disp: 14 each, Rfl: 0   Fluticasone-Umeclidin-Vilant (TRELEGY ELLIPTA) 200-62.5-25 MCG/INH AEPB, Inhale 1 puff into the lungs daily., Disp: 1 each, Rfl: 0   insulin glargine (LANTUS) 100 UNIT/ML Solostar Pen, Inject 120 Units into the skin every morning. And pen needles 1/day, Disp: 120 mL, Rfl: 3   Insulin Pen Needle (B-D UF III MINI PEN NEEDLES) 31G X 5 MM MISC, USE AS DIRECTED EVERY DAY, Disp: 100 each, Rfl: 3   Lancets (ONETOUCH DELICA PLUS [id number]) MISC, TEST IN THE MORNING AND AT  BEDTIME, Disp: 200 each, Rfl: 3   lidocaine (LIDODERM) 5 %, Place 1 patch onto the skin daily. Remove & Discard patch within 12 hours or as directed by MD, Disp: 30 patch, Rfl: 1   losartan (COZAAR) 25 MG tablet, losartan 25 mg tablet  TAKE 2 TABLETS BY MOUTH EVERY DAY, Disp: , Rfl:    ONETOUCH VERIO test strip, TEST IN THE MORNING AND AT  BEDTIME, Disp: 200 strip, Rfl: 3   OVER THE COUNTER MEDICATION, Take 4 capsules by mouth daily. Nitric oxide supplement, Disp: , Rfl:    OVER THE COUNTER MEDICATION, Take 1 application by mouth daily as needed. Pre workout vitamin supplement mix, Disp: , Rfl:  Protein POWD, Take 1 scoop by mouth daily as needed., Disp: , Rfl:    sildenafil (VIAGRA) 100 MG tablet, Take 0.5-1 tablets (50-100 mg total) by mouth daily as needed for erectile dysfunction., Disp: 6 tablet, Rfl: 19

## 2021-05-28 LAB — ANTI-JO 1 ANTIBODY, IGG: Anti JO-1: <0.2 AI (ref 0.0–0.9)

## 2021-05-29 ENCOUNTER — Telehealth: Payer: Self-pay | Admitting: Pulmonary Disease

## 2021-05-29 DIAGNOSIS — J849 Interstitial pulmonary disease, unspecified: Secondary | ICD-10-CM

## 2021-05-29 LAB — ANTI-DNA ANTIBODY, DOUBLE-STRANDED: ds DNA Ab: 3 IU/mL

## 2021-05-29 LAB — ANA: Anti Nuclear Antibody (ANA): NEGATIVE

## 2021-05-29 LAB — CK TOTAL AND CKMB (NOT AT ARMC)
CK, MB: 8.2 ng/mL — ABNORMAL HIGH (ref 0–5.0)
Relative Index: 0.8 (ref 0–4.0)
Total CK: 1046 U/L — ABNORMAL HIGH (ref 44–196)

## 2021-05-29 LAB — SJOGREN'S SYNDROME ANTIBODS(SSA + SSB)
SSA (Ro) (ENA) Antibody, IgG: <1 AI
SSB (La) (ENA) Antibody, IgG: <1 AI

## 2021-05-29 LAB — ANTI-SMITH ANTIBODY: ENA SM Ab Ser-aCnc: <1 AI

## 2021-05-29 LAB — CENTROMERE ANTIBODIES: Centromere Ab Screen: <1 AI

## 2021-05-29 LAB — ANTI-SCLERODERMA ANTIBODY: Scleroderma (Scl-70) (ENA) Antibody, IgG: <1 AI

## 2021-05-29 LAB — RHEUMATOID FACTOR: Rheumatoid fact SerPl-aCnc: <14 IU/mL (ref <14)

## 2021-05-29 LAB — ALDOLASE: Aldolase: 5.6 U/L (ref <=8.1)

## 2021-05-29 LAB — CYCLIC CITRUL PEPTIDE ANTIBODY, IGG: Cyclic Citrullin Peptide Ab: <16 UNITS

## 2021-05-29 NOTE — Telephone Encounter (Signed)
Tried to call [facility] in regards to the stat lab but each time I try to call, I get a statement that says that the call could not be completed as dialed.  I did look at pt's recent labs and saw that pt's CK total was 1,046.  With Dr. Erin Fulling being off, routing to provider of the day. Dr. Lamonte Sakai, please advise.  Also routing to Dr. Erin Fulling as an Juluis Rainier.

## 2021-05-29 NOTE — Telephone Encounter (Signed)
I reviewed his lab work, note total CK 1046.  The remainder of his autoimmune panel appears to be negative.  Consistent with myositis and possibly a generalized autoimmune process.  Would be atypical for sarcoidosis which has been questioned as a possible diagnosis for him.  I think he can follow-up this result with Dr. Erin Fulling and determine future work-up.  Please make sure that he is off of his rosuvastatin or any other statin (cholesterol medicine).

## 2021-05-29 NOTE — Telephone Encounter (Signed)
Called patient but he did not answer. Left message for him to call back.  

## 2021-05-30 ENCOUNTER — Other Ambulatory Visit: Payer: 59

## 2021-05-30 DIAGNOSIS — J849 Interstitial pulmonary disease, unspecified: Secondary | ICD-10-CM

## 2021-05-30 NOTE — Telephone Encounter (Signed)
Called spoke with patient.  Let him know to come back today to get labs drawn. Gave patient address and he said he would stop by sometime today. I let him know lab goes to lunch from 1-2 and has to be here before 4pm. Patient voiced understanding, nothing further needed at this time.

## 2021-05-30 NOTE — Telephone Encounter (Signed)
Hi Cherina,   I have ordered myositis III panel. Please have patient come back in for the lab draw.   Thanks, Wille Glaser

## 2021-06-09 LAB — BASIC METABOLIC PANEL
BUN: 48 — AB (ref 4–21)
CO2: 26 — AB (ref 13–22)
Chloride: 103 (ref 99–108)
Creatinine: 2.3 — AB (ref 0.6–1.3)
Glucose: 207
Potassium: 4.1 (ref 3.4–5.3)
Sodium: 141 (ref 137–147)

## 2021-06-09 LAB — MICROALBUMIN, URINE: Microalb, Ur: 112.5

## 2021-06-09 LAB — COMPREHENSIVE METABOLIC PANEL
Albumin: 4.3 (ref 3.5–5.0)
Calcium: 11.3 — AB (ref 8.7–10.7)
GFR calc Af Amer: 32

## 2021-06-09 LAB — CBC AND DIFFERENTIAL: Hemoglobin: 10.4 — AB (ref 13.5–17.5)

## 2021-06-14 LAB — MYOMARKER 3 PLUS PROFILE (RDL)

## 2021-06-16 ENCOUNTER — Ambulatory Visit
Admission: RE | Admit: 2021-06-16 | Discharge: 2021-06-16 | Disposition: A | Discharge status: Home / Self Care | Payer: No Typology Code available for payment source | Source: Ambulatory Visit | Attending: Cardiology | Admitting: Cardiology

## 2021-06-16 DIAGNOSIS — R072 Precordial pain: Secondary | ICD-10-CM

## 2021-06-17 ENCOUNTER — Encounter: Payer: Self-pay | Admitting: Internal Medicine

## 2021-06-18 ENCOUNTER — Ambulatory Visit: Payer: 59

## 2021-06-18 ENCOUNTER — Other Ambulatory Visit: Payer: Self-pay

## 2021-06-18 DIAGNOSIS — R072 Precordial pain: Secondary | ICD-10-CM

## 2021-06-18 DIAGNOSIS — R079 Chest pain, unspecified: Secondary | ICD-10-CM

## 2021-06-20 ENCOUNTER — Ambulatory Visit
Admission: RE | Admit: 2021-06-20 | Discharge: 2021-06-20 | Disposition: A | Discharge status: Home / Self Care | Payer: 59 | Source: Ambulatory Visit | Attending: Pulmonary Disease | Admitting: Pulmonary Disease

## 2021-06-20 DIAGNOSIS — J849 Interstitial pulmonary disease, unspecified: Secondary | ICD-10-CM

## 2021-06-26 ENCOUNTER — Encounter: Payer: Self-pay | Admitting: Cardiology

## 2021-06-26 ENCOUNTER — Other Ambulatory Visit: Payer: Self-pay

## 2021-06-26 ENCOUNTER — Ambulatory Visit: Payer: 59 | Admitting: Cardiology

## 2021-06-26 VITALS — BP 143/84 | HR 85 | Temp 97.2°F | Resp 16 | Ht 71.0 in | Wt 211.0 lb

## 2021-06-26 DIAGNOSIS — Z87891 Personal history of nicotine dependence: Secondary | ICD-10-CM

## 2021-06-26 DIAGNOSIS — Z8249 Family history of ischemic heart disease and other diseases of the circulatory system: Secondary | ICD-10-CM

## 2021-06-26 DIAGNOSIS — N1832 Chronic kidney disease, stage 3b: Secondary | ICD-10-CM

## 2021-06-26 DIAGNOSIS — I429 Cardiomyopathy, unspecified: Secondary | ICD-10-CM

## 2021-06-26 DIAGNOSIS — Z794 Long term (current) use of insulin: Secondary | ICD-10-CM

## 2021-06-26 DIAGNOSIS — E1165 Type 2 diabetes mellitus with hyperglycemia: Secondary | ICD-10-CM

## 2021-06-26 DIAGNOSIS — I10 Essential (primary) hypertension: Secondary | ICD-10-CM

## 2021-06-26 MED ORDER — ENTRESTO 49-51 MG PO TABS: 1.0000 | ORAL_TABLET | Freq: Two times a day (BID) | ORAL | 0 refills | Status: DC

## 2021-06-26 NOTE — Progress Notes (Signed)
Date:  06/26/2021   ID:  [name], DOB 1966-02-02, MRN [id number]  PCP:  Colon Branch, MD  Cardiologist:  Rex Kras, DO, [facility] (established care 05/22/2021)  Date: 06/26/21 Last Office Visit: 05/22/2021  Chief Complaint  Patient presents with   Precordial pain   Results   Follow-up    HPI  Wayne Webb is a 55 y.o. male who presents to the office with a chief complaint of "reevaluation of chest pain and review test results" Patient's past medical history and cardiovascular risk factors include: hypertension, hyperlipidemia (not on statins due to intolerance), IDDM Type II, former smoker, premature CAD in family, erectile dysfunction.  He is referred to the office at the request of Colon Branch, MD for evaluation of chest pain.  During initial consultation patient described epigastric discomfort that is been present for approximately 12 months but has noticeably gone progressive, occurring daily, intensity 6 out of 10, throbbing-like sensation, nonradiating, not effort related, resolves in approximately 20 minutes or self-limited.  Given his cardiovascular risk factors and estimated 10-year risk of ASCVD greater than 15% the shared decision was to proceed with an ischemic evaluation.  He underwent an echocardiogram, exercise nuclear stress test, and coronary artery calcium score.  Results were reviewed with him in great detail and noted below for further reference.  Clinically since last office visit patient states that he is doing well from a cardiovascular standpoint.  No hospitalizations or urgent care visits.  He continues to have epigastric discomfort intermittently.  Patient states that he is aware of the noncardiac findings on the recent coronary calcium report and has undergone a CT of the chest.  Mom had CABG at age of 70 and shortly died thereafter.   FUNCTIONAL STATUS: Goes to the gym three times a week (treadmill or elliptical) for 30 minutes followed by  resistance training.   ALLERGIES: No Known Allergies  MEDICATION LIST PRIOR TO VISIT: Current Meds  Medication Sig   aspirin EC 81 MG tablet Take 81 mg by mouth daily.   azelastine (ASTELIN) 0.1 % nasal spray Place 2 sprays into both nostrils at bedtime as needed for rhinitis or allergies.   Blood Glucose Monitoring Suppl (ONETOUCH VERIO) w/Device KIT 1 each by Does not apply route 2 (two) times daily. E11.9   Dulaglutide (TRULICITY) 4.38 OI/7.5ZV SOPN Inject 0.75 mg into the skin once a week.   ezetimibe (ZETIA) 10 MG tablet Take 1 tablet (10 mg total) by mouth daily.   Fluticasone-Umeclidin-Vilant (TRELEGY ELLIPTA) 200-62.5-25 MCG/INH AEPB Inhale 1 puff into the lungs daily.   insulin glargine (LANTUS) 100 UNIT/ML Solostar Pen Inject 120 Units into the skin every morning. And pen needles 1/day   Insulin Pen Needle (B-D UF III MINI PEN NEEDLES) 31G X 5 MM MISC USE AS DIRECTED EVERY DAY   Lancets (ONETOUCH DELICA PLUS [id number]) MISC TEST IN THE MORNING AND AT  BEDTIME   ONETOUCH VERIO test strip TEST IN THE MORNING AND AT  BEDTIME   OVER THE COUNTER MEDICATION Take 4 capsules by mouth daily. Nitric oxide supplement   OVER THE COUNTER MEDICATION Take 1 application by mouth daily as needed. Pre workout vitamin supplement mix   Protein POWD Take 1 scoop by mouth daily as needed.   sacubitril-valsartan (ENTRESTO) 49-51 MG Take 1 tablet by mouth 2 (two) times daily.   sildenafil (VIAGRA) 100 MG tablet Take 0.5-1 tablets (50-100 mg total) by mouth daily as needed for erectile dysfunction.   [DISCONTINUED]  losartan (COZAAR) 25 MG tablet losartan 25 mg tablet  TAKE 2 TABLETS BY MOUTH EVERY DAY     PAST MEDICAL HISTORY: Past Medical History:  Diagnosis Date   ALLERGIC RHINITIS 03/15/2007   CRI (chronic renal insufficiency)    CRI, creat 1.4 , renal u/s 04-2011 neg   DIABETES MELLITUS, TYPE II 03/15/2007   Diplopia 12/24/2009   Elevated LFTs    ERECTILE DYSFUNCTION 03/15/2007   GOITER,  MULTINODULAR 03/21/2010   HYPERLIPIDEMIA 03/15/2007    PAST SURGICAL HISTORY: Past Surgical History:  Procedure Laterality Date   LASIK Left 10/2012    FAMILY HISTORY: The patient family history includes Diabetes (age of onset: 79) in his mother; Heart disease in his mother; Hypertension in his mother; Prostate cancer (age of onset: 58) in his father.  SOCIAL HISTORY:  The patient  reports that he quit smoking about 27 years ago. His smoking use included cigarettes. He has a 5.00 pack-year smoking history. He has never used smokeless tobacco. He reports that he does not drink alcohol and does not use drugs.  REVIEW OF SYSTEMS: Review of Systems  Constitutional: Negative for chills and fever.  HENT:  Negative for hoarse voice and nosebleeds.   Eyes:  Negative for discharge, double vision and pain.  Cardiovascular:  Positive for chest pain (epigastric, stable). Negative for claudication, dyspnea on exertion, leg swelling, near-syncope, orthopnea, palpitations, paroxysmal nocturnal dyspnea and syncope.  Respiratory:  Negative for hemoptysis and shortness of breath.   Musculoskeletal:  Negative for muscle cramps and myalgias.  Gastrointestinal:  Negative for abdominal pain, constipation, diarrhea, hematemesis, hematochezia, melena, nausea and vomiting.  Neurological:  Negative for dizziness and light-headedness.   PHYSICAL EXAM: Vitals with BMI 06/26/2021 05/27/2021 05/22/2021  Height 5' 11"  5' 11"  5' 11"   Weight 211 lbs 209 lbs 10 oz 207 lbs 6 oz  BMI 29.44 41.32 44.01  Systolic 027 253 664  Diastolic 84 84 77  Pulse 85 101 90    CONSTITUTIONAL: Well-developed and well-nourished. No acute distress.  SKIN: Skin is warm and dry. No rash noted. No cyanosis. No pallor. No jaundice HEAD: Normocephalic and atraumatic.  EYES: No scleral icterus MOUTH/THROAT: Moist oral membranes.  NECK: No JVD present. No thyromegaly noted. No carotid bruits  LYMPHATIC: No visible cervical adenopathy.   CHEST Normal respiratory effort. No intercostal retractions  LUNGS: Clear to auscultation bilaterally. No stridor. No wheezes. No rales.  CARDIOVASCULAR: Regular rate and rhythm, positive S1-S2, no murmurs rubs or gallops appreciated. ABDOMINAL: Soft, nontender, nondistended, positive bowel sounds all 4 quadrants. No apparent ascites.  EXTREMITIES: No peripheral edema  HEMATOLOGIC: No significant bruising NEUROLOGIC: Oriented to person, place, and time. Nonfocal. Normal muscle tone.  PSYCHIATRIC: Normal mood and affect. Normal behavior. Cooperative  CARDIAC DATABASE: EKG: 05/22/2021: Normal sinus rhythm, 90 bpm, LVH per voltage criteria, without underlying ischemia or injury pattern, as nonspecific ST-T changes most likely secondary to LVH.  06/18/2021: Left ventricle cavity is normal in size. Moderate concentric hypertrophy of the left ventricle. Normal global wall motion. Moderate global hypokinesis. LVEF 35-40%. Normal diastolic filling pattern. Left atrial cavity is mildly dilated. Mild (Grade I) aortic regurgitation. Mild (Grade I) mitral regurgitation. Mild tricuspid regurgitation. No evidence of pulmonary hypertension. ~~~Personally reviewed the images LVEF appears to be 45-50% with grade 1 diastolic impairment.  And mild global hypokinesis ~~~    Stress Testing: Exercise Sestamibi stress test 06/18/2021: 1 Day Rest/Stress Protocol. Exercise time 5 minutes 12 seconds on Bruce protocol, achieved 7.05 METS, 90% of  APMHR.  Normal myocardial perfusion without convincing evidence of reversible myocardial ischemia or prior infarct. LV cavity mildly dilated, wall thickness is preserved, without regional wall motion abnormalities. Calculated LVEF 46%, visually appears preserved. Low risk study.  Heart Catheterization: None   Coronary calcium scoring: 06/16/2021: 1. Coronary calcium score of 0. 2. Stable mediastinal and hilar lymphadenopathy with multiple calcified lymph nodes.  Stability since 2016 supports benign etiology and is felt to most likely relate to sarcoidosis. 3. Relatively stable significant underlying fibrotic lung disease with associated extensive bronchiectasis. Findings support lung disease associated with sarcoidosis. 4. Stable right-sided aortic arch.   LABORATORY DATA: CBC Latest Ref Rng & Units 06/09/2021 03/07/2021 12/18/2020  WBC - - 5.4 -  Hemoglobin 13.5 - 17.5 10.4(A) 11.9(A) 15.6  Hematocrit 41 - 53 - 35(A) 46.0  Platelets 150 - 399 - 193 -    CMP Latest Ref Rng & Units 06/09/2021 03/07/2021 01/29/2021  Glucose 70 - 99 mg/dL - - 186(H)  BUN 4 - 21 48(A) 40(A) 35(H)  Creatinine 0.6 - 1.3 2.3(A) 1.9(A) 2.16(H)  Sodium 137 - 147 141 143 137  Potassium 3.4 - 5.3 4.1 4.1 4.5  Chloride 99 - 108 103 101 100  CO2 13 - 22 26(A) 31(A) 29  Calcium 8.7 - 10.7 11.3(A) 11.4(A) 11.1(H)  Total Protein 6.0 - 8.3 g/dL - - 7.7  Total Bilirubin 0.2 - 1.2 mg/dL - - 0.6  Alkaline Phos 25 - 125 - 135(A) 118(H)  AST 14 - 40 - 71(A) 48(H)  ALT 10 - 40 - 60(A) 42    Lipid Panel  Lab Results  Component Value Date   CHOL 177 01/29/2021   HDL 45.30 01/29/2021   LDLCALC 106 (H) 01/29/2021   LDLDIRECT 78.0 04/02/2020   TRIG 132.0 01/29/2021   CHOLHDL 4 01/29/2021    No components found for: NTPROBNP No results for input(s): PROBNP in the last 8760 hours. Recent Labs    12/17/20 1601  TSH 1.03    BMP Recent Labs    12/18/20 1101 12/18/20 1620 12/25/20 1215 01/29/21 0803 03/07/21 0000 06/09/21 0000  NA 129*   < > 133* 137 143 141  K 3.9   < > 4.1 4.5 4.1 4.1  CL 90*  --  96 100 101 103  CO2 25  --  30 29 31* 26*  GLUCOSE 519*  --  362* 186*  --   --   BUN 63*  --  42* 35* 40* 48*  CREATININE 2.59*  --  1.86* 2.16* 1.9* 2.3*  CALCIUM 10.7*  --  10.3 11.1* 11.4* 11.3*  GFRNONAA 28*  --   --   --   --   --   GFRAA  --   --   --   --  40 32   < > = values in this interval not displayed.    HEMOGLOBIN A1C Lab Results  Component Value  Date   HGBA1C 6.0 (A) 03/12/2021   MPG 243 (H) 11/03/2012    IMPRESSION:    ICD-10-CM   1. Cardiomyopathy, presumed nonischemic  I42.9 sacubitril-valsartan (ENTRESTO) 49-51 MG    Basic metabolic panel    Pro b natriuretic peptide (BNP)    Magnesium    2. Essential hypertension  I10     3. Type 2 diabetes mellitus with hyperglycemia, with long-term current use of insulin (HCC)  E11.65 sacubitril-valsartan (ENTRESTO) 49-51 MG   Z79.4     4. Stage 3b chronic kidney disease ([place])  N18.32     5. Family history of premature CAD  Z82.49     6. Former smoker  Z87.891        RECOMMENDATIONS: MYKEAL CARRICK is a 55 y.o. male whose past medical history and cardiac risk factors include: hypertension, hyperlipidemia (not on statins due to intolerance), IDDM Type II, former smoker, premature CAD in family, erectile dysfunction.  Cardiomyopathy, presumed nonischemic Given his epigastric discomfort he had undergone an ischemic evaluation as outlined above.  Echocardiogram notes LVEF of 35-40%.  I personally reviewed the images as a part of today's encounter and LVEF is more towards 66-06%, grade 1 diastolic impairment, and mild global hypokinesis. Patient's total coronary calcium score is 0 and recent nuclear stress test does not show reversible ischemia.  I suspect that he has nonischemic cardiomyopathy. Would not recommend left heart catheterization at this time and his symptoms are quite stable and given his chronic kidney disease stage IIIb. Recommend up titration of guideline directed medical therapy. We also discussed on the importance of improving his modifiable cardiovascular risk factors. Discontinue losartan. Start Entresto 49/51 mg p.o. twice daily.  30-day free trial assistance provided. Follow-up blood work in 1 week to evaluate kidney function and electrolytes. Plan to start [place] at the next office visit.  Essential hypertension Currently not at goal. Would recommend a  systolic blood pressure goal of 130 mmHg or less to be able to tolerate. Patient is asked to keep a log of his blood pressures and to review it with either myself or his PCP. Low-salt diet recommended  Type 2 diabetes mellitus with hyperglycemia, with long-term current use of insulin (HCC) Currently on insulin therapy. Most recent hemoglobin A1c within acceptable range. Educated in the importance of glycemic control.  Family history of premature CAD Total coronary calcium score of 0. However, given his insulin-dependent diabetes would still recommend statin therapy. Patient has tried rosuvastatin 5 mg in the past which caused myalgias. We discussed either starting low-dose atorvastatin or Livalo.  We will readdress this at the next office visit.  Former smoker Educated on the importance of continued smoking cessation.  FINAL MEDICATION LIST END OF ENCOUNTER: Meds ordered this encounter  Medications   sacubitril-valsartan (ENTRESTO) 49-51 MG    Sig: Take 1 tablet by mouth 2 (two) times daily.    Dispense:  60 tablet    Refill:  0     Medications Discontinued During This Encounter  Medication Reason   lidocaine (LIDODERM) 5 % Error   Fluticasone-Umeclidin-Vilant (TRELEGY ELLIPTA) 200-62.5-25 MCG/INH AEPB Duplicate   losartan (COZAAR) 25 MG tablet Discontinued by provider     Current Outpatient Medications:    aspirin EC 81 MG tablet, Take 81 mg by mouth daily., Disp: , Rfl:    azelastine (ASTELIN) 0.1 % nasal spray, Place 2 sprays into both nostrils at bedtime as needed for rhinitis or allergies., Disp: 90 mL, Rfl: 3   Blood Glucose Monitoring Suppl (ONETOUCH VERIO) w/Device KIT, 1 each by Does not apply route 2 (two) times daily. E11.9, Disp: 1 kit, Rfl: 0   Dulaglutide (TRULICITY) 3.01 SW/1.0XN SOPN, Inject 0.75 mg into the skin once a week., Disp: 6 mL, Rfl: 3   ezetimibe (ZETIA) 10 MG tablet, Take 1 tablet (10 mg total) by mouth daily., Disp: 90 tablet, Rfl: 3    Fluticasone-Umeclidin-Vilant (TRELEGY ELLIPTA) 200-62.5-25 MCG/INH AEPB, Inhale 1 puff into the lungs daily., Disp: 14 each, Rfl: 0   insulin glargine (LANTUS) 100 UNIT/ML Solostar Pen, Inject 120  Units into the skin every morning. And pen needles 1/day, Disp: 120 mL, Rfl: 3   Insulin Pen Needle (B-D UF III MINI PEN NEEDLES) 31G X 5 MM MISC, USE AS DIRECTED EVERY DAY, Disp: 100 each, Rfl: 3   Lancets (ONETOUCH DELICA PLUS [id number]) MISC, TEST IN THE MORNING AND AT  BEDTIME, Disp: 200 each, Rfl: 3   ONETOUCH VERIO test strip, TEST IN THE MORNING AND AT  BEDTIME, Disp: 200 strip, Rfl: 3   OVER THE COUNTER MEDICATION, Take 4 capsules by mouth daily. Nitric oxide supplement, Disp: , Rfl:    OVER THE COUNTER MEDICATION, Take 1 application by mouth daily as needed. Pre workout vitamin supplement mix, Disp: , Rfl:    Protein POWD, Take 1 scoop by mouth daily as needed., Disp: , Rfl:    sacubitril-valsartan (ENTRESTO) 49-51 MG, Take 1 tablet by mouth 2 (two) times daily., Disp: 60 tablet, Rfl: 0   sildenafil (VIAGRA) 100 MG tablet, Take 0.5-1 tablets (50-100 mg total) by mouth daily as needed for erectile dysfunction., Disp: 6 tablet, Rfl: 19  Orders Placed This Encounter  Procedures   Basic metabolic panel   Pro b natriuretic peptide (BNP)   Magnesium    There are no Patient Instructions on file for this visit.   --Continue cardiac medications as reconciled in final medication list. --Return in about 4 weeks (around 07/24/2021) for Follow up cardiomyopathy . Or sooner if needed. --Continue follow-up with your primary care physician regarding the management of your other chronic comorbid conditions.  Patient's questions and concerns were addressed to his satisfaction. He voices understanding of the instructions provided during this encounter.   This note was created using a voice recognition software as a result there may be grammatical errors inadvertently enclosed that do not reflect the nature  of this encounter. Every attempt is made to correct such errors.  Rex Kras, [place], [facility]  Pager: [phone] Office: [phone]

## 2021-07-07 ENCOUNTER — Ambulatory Visit (INDEPENDENT_AMBULATORY_CARE_PROVIDER_SITE_OTHER): Payer: 59 | Admitting: Endocrinology

## 2021-07-07 ENCOUNTER — Other Ambulatory Visit: Payer: Self-pay

## 2021-07-07 VITALS — BP 146/70 | HR 81 | Ht 71.0 in | Wt 212.8 lb

## 2021-07-07 DIAGNOSIS — Z794 Long term (current) use of insulin: Secondary | ICD-10-CM

## 2021-07-07 DIAGNOSIS — E1122 Type 2 diabetes mellitus with diabetic chronic kidney disease: Secondary | ICD-10-CM

## 2021-07-07 DIAGNOSIS — N1831 Chronic kidney disease, stage 3a: Secondary | ICD-10-CM

## 2021-07-07 LAB — POCT GLYCOSYLATED HEMOGLOBIN (HGB A1C): Hemoglobin A1C: 5.2 % (ref 4.0–5.6)

## 2021-07-07 MED ORDER — DEXCOM G6 TRANSMITTER MISC: 1.0000 | Freq: Once | 1 refills | Status: AC

## 2021-07-07 MED ORDER — DEXCOM G6 SENSOR MISC: 1.0000 | 3 refills | Status: DC

## 2021-07-07 MED ORDER — DEXCOM G6 RECEIVER DEVI: 1.0000 | Freq: Once | 1 refills | Status: AC

## 2021-07-07 NOTE — Patient Instructions (Addendum)
Blood tests are requested for you today.  We'll let you know about the results.  I have sent a prescription to your pharmacy, for the continuous glucose monitor check your blood sugar twice a day.  vary the time of day when you check, between before the 3 meals, and at bedtime.  also check if you have symptoms of your blood sugar being too high or too low.  please keep a record of the readings and bring it to your next appointment here (or you can bring the meter itself).  You can write it on any piece of paper.  please call us sooner if your blood sugar goes below 70, or if most of your readings are over 200.   Please come back for a follow-up appointment in 2-3 months.

## 2021-07-07 NOTE — Progress Notes (Signed)
Subjective:    Patient ID: Wayne Webb, male    DOB: January 21, 1966, 55 y.o.   MRN: [id number]  HPI Pt returns for f/u of diabetes mellitus:  DM type: Insulin-requiring type 2 Dx'ed: 5916 Complications: DR and CRI.   Therapy: insulin since 2011.  DKA: never Severe hypoglycemia: never.  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   Other: he declines multiple daily injections.   Interval history: pt states he feels well in general.  Pt says he never misses the insulin. no cbg record, but states cbg varies from 65-145.  There is no trend throughout the day.  He takes meds as rx'ed.   Past Medical History:  Diagnosis Date   ALLERGIC RHINITIS 03/15/2007   CRI (chronic renal insufficiency)    CRI, creat 1.4 , renal u/s 04-2011 neg   DIABETES MELLITUS, TYPE II 03/15/2007   Diplopia 12/24/2009   Elevated LFTs    ERECTILE DYSFUNCTION 03/15/2007   GOITER, MULTINODULAR 03/21/2010   HYPERLIPIDEMIA 03/15/2007    Past Surgical History:  Procedure Laterality Date   LASIK Left 10/2012    Social History   Socioeconomic History   Marital status: Legally Separated    Spouse name: Not on file   Number of children: 1   Years of education: Not on file   Highest education level: Not on file  Occupational History   Occupation: active job ([employer])    Comment: Maintenence   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 Remarried : pt , wife    Social Determinants of Radio broadcast assistant Strain:  Not on file  Food Insecurity: Not on file  Transportation Needs: Not on file  Physical Activity: Not on file  Stress: Not on file  Social Connections: Not on file  Intimate Partner Violence: Not on file    Current Outpatient Medications on File Prior to Visit  Medication Sig Dispense Refill   aspirin EC 81 MG tablet Take 81 mg by mouth daily.     azelastine (ASTELIN) 0.1 % nasal spray Place 2 sprays into both nostrils at bedtime as needed for rhinitis or allergies. 90 mL 3   Blood Glucose Monitoring Suppl (ONETOUCH VERIO) w/Device KIT 1 each by Does not apply route 2 (two) times daily. E11.9 1 kit 0   Dulaglutide (TRULICITY) 3.84 YK/5.9DJ SOPN Inject 0.75 mg into the skin once a week. 6 mL 3   ezetimibe (ZETIA) 10 MG tablet Take 1 tablet (10 mg total) by mouth daily. 90 tablet 3   Fluticasone-Umeclidin-Vilant (TRELEGY ELLIPTA) 200-62.5-25 MCG/INH AEPB Inhale 1 puff into the lungs daily. 14 each 0   insulin glargine (LANTUS) 100 UNIT/ML Solostar Pen Inject 120 Units into the skin every morning. And pen needles 1/day 120 mL 3   Insulin Pen Needle (B-D UF III MINI PEN NEEDLES) 31G X 5  MM MISC USE AS DIRECTED EVERY DAY 100 each 3   Lancets (ONETOUCH DELICA PLUS [id number]) MISC TEST IN THE MORNING AND AT  BEDTIME 200 each 3   ONETOUCH VERIO test strip TEST IN THE MORNING AND AT  BEDTIME 200 strip 3   OVER THE COUNTER MEDICATION Take 4 capsules by mouth daily. Nitric oxide supplement     OVER THE COUNTER MEDICATION Take 1 application by mouth daily as needed. Pre workout vitamin supplement mix     Protein POWD Take 1 scoop by mouth daily as needed.     sacubitril-valsartan (ENTRESTO) 49-51 MG Take 1 tablet by mouth 2 (two) times daily. 60 tablet 0   sildenafil (VIAGRA) 100 MG tablet Take 0.5-1 tablets (50-100 mg total) by mouth daily as needed for erectile dysfunction. 6 tablet 19   No current facility-administered medications on file prior to visit.    No Known Allergies  Family History   Problem Relation Age of Onset   Heart disease Mother        CABG-onset in her early 73's   Diabetes Mother 8       had DM from her 73's   Hypertension Mother    Prostate cancer Father 32       dx in his 72s   Stroke Neg Hx    Colon cancer Neg Hx     BP (!) 146/70 (BP Location: Right Arm, Patient Position: Sitting, Cuff Size: Large)   Pulse 81   Ht 5' 11"  (1.803 m)   Wt 212 lb 12.8 oz (96.5 kg)   SpO2 96%   BMI 29.68 kg/m    Review of Systems Denies n/v.      Objective:   Physical Exam Pulses: dorsalis pedis intact bilat.   MSK: no deformity of the feet CV: no leg edema Skin:  no ulcer on the feet.  normal color and temp on the feet.  Neuro: sensation is intact to touch on the feet.    Lab Results  Component Value Date   CREATININE 2.3 (A) 06/09/2021   BUN 48 (A) 06/09/2021   NA 141 06/09/2021   K 4.1 06/09/2021   CL 103 06/09/2021   CO2 26 (A) 06/09/2021   Lab Results  Component Value Date   TSH 1.03 12/17/2020   A1c=5.2%    Assessment & Plan:  Insulin-requiring type 2 DM: overcontrolled.  We'll reduce insulin when A1c is confirmed by fructosamine.    Patient Instructions  Blood tests are requested for you today.  We'll let you know about the results.  I have sent a prescription to your pharmacy, for the continuous glucose monitor check your blood sugar twice a day.  vary the time of day when you check, between before the 3 meals, and at bedtime.  also check if you have symptoms of your blood sugar being too high or too low.  please keep a record of the readings and bring it to your next appointment here (or you can bring the meter itself).  You can write it on any piece of paper.  please call us sooner if your blood sugar goes below 70, or if most of your readings are over 200.   Please come back for a follow-up appointment in 2-3 months.

## 2021-07-08 ENCOUNTER — Telehealth: Payer: Self-pay | Admitting: Pharmacy Technician

## 2021-07-08 NOTE — Telephone Encounter (Addendum)
Received notification from [place] regarding a prior authorization for [place] has been APPROVED from 07/08/2021 to 07/08/2022.     Authorization # [id number]   Patient Advocate Encounter   Received notification from [place] that prior authorization for [facility] G6 RECEIVER is required.   PA submitted on 07/08/2021 Key [id number] Status is pending    [facility] will continue to follow   Ronney Asters, CPhT Patient Advocate [facility] Phone: [phone] Fax:  [phone]

## 2021-07-08 NOTE — Telephone Encounter (Signed)
Received notification from [place] regarding a prior authorization for [employer] G6 TRANSMITTER. Authorization has been APPROVED from 07/08/2021 to 07/08/2022.     Authorization # [id number] KEY: [id number]

## 2021-07-08 NOTE — Telephone Encounter (Signed)
Received notification from [place] regarding a prior authorization for [place]. Authorization has been APPROVED from 07/08/2021 to 07/08/2022.     Authorization # [id number]

## 2021-07-10 LAB — FRUCTOSAMINE: Fructosamine: 301 umol/L — ABNORMAL HIGH (ref 205–285)

## 2021-07-11 ENCOUNTER — Other Ambulatory Visit: Payer: Self-pay

## 2021-07-11 DIAGNOSIS — I429 Cardiomyopathy, unspecified: Secondary | ICD-10-CM

## 2021-07-24 ENCOUNTER — Other Ambulatory Visit: Payer: Self-pay | Admitting: Cardiology

## 2021-07-24 DIAGNOSIS — I429 Cardiomyopathy, unspecified: Secondary | ICD-10-CM

## 2021-07-24 DIAGNOSIS — Z794 Long term (current) use of insulin: Secondary | ICD-10-CM

## 2021-07-24 DIAGNOSIS — E1165 Type 2 diabetes mellitus with hyperglycemia: Secondary | ICD-10-CM

## 2021-07-24 NOTE — Telephone Encounter (Signed)
ERROR

## 2021-07-30 ENCOUNTER — Ambulatory Visit: Payer: 59 | Admitting: Cardiology

## 2021-08-22 ENCOUNTER — Encounter: Payer: Self-pay | Admitting: Cardiology

## 2021-08-22 ENCOUNTER — Ambulatory Visit: Payer: 59 | Admitting: Cardiology

## 2021-08-22 ENCOUNTER — Other Ambulatory Visit: Payer: Self-pay

## 2021-08-22 VITALS — BP 135/74 | HR 93 | Resp 16 | Ht 71.0 in | Wt 208.0 lb

## 2021-08-22 DIAGNOSIS — Z794 Long term (current) use of insulin: Secondary | ICD-10-CM

## 2021-08-22 DIAGNOSIS — Z87891 Personal history of nicotine dependence: Secondary | ICD-10-CM

## 2021-08-22 DIAGNOSIS — I10 Essential (primary) hypertension: Secondary | ICD-10-CM

## 2021-08-22 DIAGNOSIS — I429 Cardiomyopathy, unspecified: Secondary | ICD-10-CM

## 2021-08-22 DIAGNOSIS — N1832 Chronic kidney disease, stage 3b: Secondary | ICD-10-CM

## 2021-08-22 DIAGNOSIS — Z8249 Family history of ischemic heart disease and other diseases of the circulatory system: Secondary | ICD-10-CM

## 2021-08-22 DIAGNOSIS — E1165 Type 2 diabetes mellitus with hyperglycemia: Secondary | ICD-10-CM

## 2021-08-22 MED ORDER — ENTRESTO 49-51 MG PO TABS: 1.0000 | ORAL_TABLET | Freq: Two times a day (BID) | ORAL | 0 refills | Status: DC

## 2021-08-22 NOTE — Progress Notes (Signed)
Date:  08/22/2021   ID:  [name], DOB 08/09/1966, MRN [id number]  PCP:  Colon Branch, MD  Cardiologist:  Rex Kras, DO, [facility] (established care 05/22/2021)  Date: 08/22/21 Last Office Visit: 06/26/2021   Chief Complaint  Patient presents with   Cardiomyopathy   Follow-up    HPI  Wayne Webb is a 55 y.o. male who presents to the office with a chief complaint of "follow-up for cardiomyopathy. " Patient's past medical history and cardiovascular risk factors include: hypertension, hyperlipidemia (not on statins due to intolerance), IDDM Type II, former smoker, premature CAD in family, erectile dysfunction.  He is referred to the office at the request of Colon Branch, MD for evaluation of chest pain.  In the past patient was experiencing epigastric discomfort that was getting progressively worse, occurring daily, intensity 6 out of 10, throbbing-like sensation, nonradiating, nonexertional, did not resolve with resting, and usually self-limited and 20 minutes.  Given his risk factors including insulin-dependent diabetes and elevated estimated 10-year risk of ASCVD patient underwent an ischemic evaluation.  Results reviewed with him in great detail in the past.  Work-up also noted reduced LVEF concerning for underlying cardiomyopathy.  Since his nuclear stress test noted normal myocardial perfusion underlying diagnosis was presumed nonischemic cardiomyopathy and was a started on guideline directed medical therapy.  At last office visit we discontinued losartan and started him on Entresto 49/51 mg p.o. twice daily and he has tolerated the medication well without any side effects or intolerances.  However, after he completed 30 days with a prescription instead of calling back for refill he stopped taking it.  I do not have any labs to see how his renal function was on Entresto.  Patient states that since starting Entresto his chest pain has resolved.  Overall euvolemic and not in  congestive heart failure.  FUNCTIONAL STATUS: Goes to the gym three times a week (treadmill or elliptical) for 30 minutes followed by resistance training.   ALLERGIES: No Known Allergies  MEDICATION LIST PRIOR TO VISIT: Current Meds  Medication Sig   aspirin EC 81 MG tablet Take 81 mg by mouth daily.   azelastine (ASTELIN) 0.1 % nasal spray Place 2 sprays into both nostrils at bedtime as needed for rhinitis or allergies.   Blood Glucose Monitoring Suppl (ONETOUCH VERIO) w/Device KIT 1 each by Does not apply route 2 (two) times daily. E11.9   Continuous Blood Gluc Sensor (DEXCOM G6 SENSOR) MISC 1 Device by Does not apply route See admin instructions. Change every 10 days   Dulaglutide (TRULICITY) 7.59 FM/3.8GY SOPN Inject 0.75 mg into the skin once a week.   ezetimibe (ZETIA) 10 MG tablet Take 1 tablet (10 mg total) by mouth daily.   Fluticasone-Umeclidin-Vilant (TRELEGY ELLIPTA) 200-62.5-25 MCG/INH AEPB Inhale 1 puff into the lungs daily.   insulin glargine (LANTUS) 100 UNIT/ML Solostar Pen Inject 120 Units into the skin every morning. And pen needles 1/day   Insulin Pen Needle (B-D UF III MINI PEN NEEDLES) 31G X 5 MM MISC USE AS DIRECTED EVERY DAY   Lancets (ONETOUCH DELICA PLUS [id number]) MISC TEST IN THE MORNING AND AT  BEDTIME   ONETOUCH VERIO test strip TEST IN THE MORNING AND AT  BEDTIME   OVER THE COUNTER MEDICATION Take 4 capsules by mouth daily. Nitric oxide supplement   OVER THE COUNTER MEDICATION Take 1 application by mouth daily as needed. Pre workout vitamin supplement mix   Protein POWD Take 1 scoop by mouth  daily as needed.   sildenafil (VIAGRA) 100 MG tablet Take 0.5-1 tablets (50-100 mg total) by mouth daily as needed for erectile dysfunction.     PAST MEDICAL HISTORY: Past Medical History:  Diagnosis Date   ALLERGIC RHINITIS 03/15/2007   CRI (chronic renal insufficiency)    CRI, creat 1.4 , renal u/s 04-2011 neg   DIABETES MELLITUS, TYPE II 03/15/2007   Diplopia  12/24/2009   Elevated LFTs    ERECTILE DYSFUNCTION 03/15/2007   GOITER, MULTINODULAR 03/21/2010   HYPERLIPIDEMIA 03/15/2007    PAST SURGICAL HISTORY: Past Surgical History:  Procedure Laterality Date   LASIK Left 10/2012    FAMILY HISTORY: The patient family history includes Diabetes (age of onset: 55) in his mother; Heart disease in his mother; Hypertension in his mother; Prostate cancer (age of onset: 61) in his father.  SOCIAL HISTORY:  The patient  reports that he quit smoking about 27 years ago. His smoking use included cigarettes. He has a 5.00 pack-year smoking history. He has never used smokeless tobacco. He reports that he does not drink alcohol and does not use drugs.  REVIEW OF SYSTEMS: Review of Systems  Constitutional: Negative for chills and fever.  HENT:  Negative for hoarse voice and nosebleeds.   Eyes:  Negative for discharge, double vision and pain.  Cardiovascular:  Negative for chest pain, claudication, dyspnea on exertion, leg swelling, near-syncope, orthopnea, palpitations, paroxysmal nocturnal dyspnea and syncope.  Respiratory:  Negative for hemoptysis and shortness of breath.   Musculoskeletal:  Negative for muscle cramps and myalgias.  Gastrointestinal:  Negative for abdominal pain, constipation, diarrhea, hematemesis, hematochezia, melena, nausea and vomiting.  Neurological:  Negative for dizziness and light-headedness.   PHYSICAL EXAM: Vitals with BMI 08/22/2021 07/07/2021 06/26/2021  Height _0  _1  _2   Weight 208 lbs 212 lbs 13 oz 211 lbs  BMI 29.02 74.25 95.63  Systolic 875 643 329  Diastolic 74 70 84  Pulse 93 81 85    CONSTITUTIONAL: Well-developed and well-nourished. No acute distress.  SKIN: Skin is warm and dry. No rash noted. No cyanosis. No pallor. No jaundice HEAD: Normocephalic and atraumatic.  EYES: No scleral icterus MOUTH/THROAT: Moist oral membranes.  NECK: No JVD present. No thyromegaly noted. No carotid bruits  LYMPHATIC: No  visible cervical adenopathy.  CHEST Normal respiratory effort. No intercostal retractions  LUNGS: Clear to auscultation bilaterally. No stridor. No wheezes. No rales.  CARDIOVASCULAR: Regular rate and rhythm, positive S1-S2, no murmurs rubs or gallops appreciated. ABDOMINAL: Soft, nontender, nondistended, positive bowel sounds all 4 quadrants. No apparent ascites.  EXTREMITIES: No peripheral edema  HEMATOLOGIC: No significant bruising NEUROLOGIC: Oriented to person, place, and time. Nonfocal. Normal muscle tone.  PSYCHIATRIC: Normal mood and affect. Normal behavior. Cooperative  CARDIAC DATABASE: EKG: 05/22/2021: Normal sinus rhythm, 90 bpm, LVH per voltage criteria, without underlying ischemia or injury pattern, as nonspecific ST-T changes most likely secondary to LVH.  06/18/2021: Left ventricle cavity is normal in size. Moderate concentric hypertrophy of the left ventricle. Normal global wall motion. Moderate global hypokinesis. LVEF 35-40%. Normal diastolic filling pattern. Left atrial cavity is mildly dilated. Mild (Grade I) aortic regurgitation. Mild (Grade I) mitral regurgitation. Mild tricuspid regurgitation. No evidence of pulmonary hypertension. ~~~Personally reviewed the images LVEF appears to be 45-50% with grade 1 diastolic impairment.  And mild global hypokinesis ~~~    Stress Testing: Exercise Sestamibi stress test 06/18/2021: 1 Day Rest/Stress Protocol. Exercise time 5 minutes 12 seconds on Bruce protocol, achieved 7.05 METS, 90%  of APMHR.  Normal myocardial perfusion without convincing evidence of reversible myocardial ischemia or prior infarct. LV cavity mildly dilated, wall thickness is preserved, without regional wall motion abnormalities. Calculated LVEF 46%, visually appears preserved. Low risk study.  Heart Catheterization: None   Coronary calcium scoring: 06/16/2021: 1. Coronary calcium score of 0. 2. Stable mediastinal and hilar lymphadenopathy with  multiple calcified lymph nodes. Stability since 2016 supports benign etiology and is felt to most likely relate to sarcoidosis. 3. Relatively stable significant underlying fibrotic lung disease with associated extensive bronchiectasis. Findings support lung disease associated with sarcoidosis. 4. Stable right-sided aortic arch.   LABORATORY DATA: CBC Latest Ref Rng & Units 06/09/2021 03/07/2021 12/18/2020  WBC - - 5.4 -  Hemoglobin 13.5 - 17.5 10.4(A) 11.9(A) 15.6  Hematocrit 41 - 53 - 35(A) 46.0  Platelets 150 - 399 - 193 -    CMP Latest Ref Rng & Units 06/09/2021 03/07/2021 01/29/2021  Glucose 70 - 99 mg/dL - - 186(H)  BUN 4 - 21 48(A) 40(A) 35(H)  Creatinine 0.6 - 1.3 2.3(A) 1.9(A) 2.16(H)  Sodium 137 - 147 141 143 137  Potassium 3.4 - 5.3 4.1 4.1 4.5  Chloride 99 - 108 103 101 100  CO2 13 - 22 26(A) 31(A) 29  Calcium 8.7 - 10.7 11.3(A) 11.4(A) 11.1(H)  Total Protein 6.0 - 8.3 g/dL - - 7.7  Total Bilirubin 0.2 - 1.2 mg/dL - - 0.6  Alkaline Phos 25 - 125 - 135(A) 118(H)  AST 14 - 40 - 71(A) 48(H)  ALT 10 - 40 - 60(A) 42    Lipid Panel  Lab Results  Component Value Date   CHOL 177 01/29/2021   HDL 45.30 01/29/2021   LDLCALC 106 (H) 01/29/2021   LDLDIRECT 78.0 04/02/2020   TRIG 132.0 01/29/2021   CHOLHDL 4 01/29/2021    No components found for: NTPROBNP No results for input(s): PROBNP in the last 8760 hours. Recent Labs    12/17/20 1601  TSH 1.03    BMP Recent Labs    12/18/20 1101 12/18/20 1620 12/25/20 1215 01/29/21 0803 03/07/21 0000 06/09/21 0000  NA 129*   < > 133* 137 143 141  K 3.9   < > 4.1 4.5 4.1 4.1  CL 90*  --  96 100 101 103  CO2 25  --  30 29 31* 26*  GLUCOSE 519*  --  362* 186*  --   --   BUN 63*  --  42* 35* 40* 48*  CREATININE 2.59*  --  1.86* 2.16* 1.9* 2.3*  CALCIUM 10.7*  --  10.3 11.1* 11.4* 11.3*  GFRNONAA 28*  --   --   --   --   --   GFRAA  --   --   --   --  40 32   < > = values in this interval not displayed.    HEMOGLOBIN  A1C Lab Results  Component Value Date   HGBA1C 5.2 07/07/2021   MPG 243 (H) 11/03/2012    IMPRESSION:    ICD-10-CM   1. Cardiomyopathy, presumed nonischemic  I42.9 sacubitril-valsartan (ENTRESTO) 49-51 MG    Basic metabolic panel    Magnesium    2. Essential hypertension  I10     3. Type 2 diabetes mellitus with hyperglycemia, with long-term current use of insulin (HCC)  E11.65 sacubitril-valsartan (ENTRESTO) 49-51 MG   Z79.4     4. Stage 3b chronic kidney disease (HCC)  N18.32     5. Family  history of premature CAD  Z82.49     6. Former smoker  Z87.891        RECOMMENDATIONS: Wayne Webb is a 55 y.o. male whose past medical history and cardiac risk factors include: hypertension, hyperlipidemia (not on statins due to intolerance), IDDM Type II, former smoker, premature CAD in family, erectile dysfunction.  Cardiomyopathy, presumed nonischemic Overall euvolemic and not in congestive heart failure. Stage B, NYHA class II Echo: Reported LVEF 35-40%, on personal review LVEF more towards 44-81%, grade 1 diastolic impairment and mild global hypokinesis. Coronary artery calcium score: Total CAC 0 Stress test: Low risk study Tolerated Entresto well.  Refill 49/51 mg p.o. twice daily.  Prescription as well as samples provided. Blood work in 1 week to evaluate kidney function and electrolytes. If blood work remained stable we will send in a prescription for Wilder Glade and will need follow-up blood work in 1 week as well. Patient is agreeable with the plan of care.  Essential hypertension Improving Medications reconciled Reemphasized the importance of low-salt diet.  Type 2 diabetes mellitus with hyperglycemia, with long-term current use of insulin (HCC) Currently on insulin therapy. Most recent hemoglobin A1c within acceptable range. Educated in the importance of glycemic control. Currently on Arni Recommended statin therapy.  Will discuss at the next office visit with  regards to low-dose atorvastatin or Livalo  Family history of premature CAD Total coronary calcium score of 0. However, given his insulin-dependent diabetes would still recommend statin therapy. Patient has tried rosuvastatin 5 mg in the past which caused myalgias.  Former smoker Educated on the importance of continued smoking cessation.  Patient has an upcoming physical with his PCP in December 2022.  I have asked him to bring those labs in at the next visit for review.  FINAL MEDICATION LIST END OF ENCOUNTER: Meds ordered this encounter  Medications   sacubitril-valsartan (ENTRESTO) 49-51 MG    Sig: Take 1 tablet by mouth 2 (two) times daily.    Dispense:  180 tablet    Refill:  0     Medications Discontinued During This Encounter  Medication Reason   ENTRESTO 49-51 MG Reorder     Current Outpatient Medications:    aspirin EC 81 MG tablet, Take 81 mg by mouth daily., Disp: , Rfl:    azelastine (ASTELIN) 0.1 % nasal spray, Place 2 sprays into both nostrils at bedtime as needed for rhinitis or allergies., Disp: 90 mL, Rfl: 3   Blood Glucose Monitoring Suppl (ONETOUCH VERIO) w/Device KIT, 1 each by Does not apply route 2 (two) times daily. E11.9, Disp: 1 kit, Rfl: 0   Continuous Blood Gluc Sensor (DEXCOM G6 SENSOR) MISC, 1 Device by Does not apply route See admin instructions. Change every 10 days, Disp: 9 each, Rfl: 3   Dulaglutide (TRULICITY) 8.56 DJ/4.9FW SOPN, Inject 0.75 mg into the skin once a week., Disp: 6 mL, Rfl: 3   ezetimibe (ZETIA) 10 MG tablet, Take 1 tablet (10 mg total) by mouth daily., Disp: 90 tablet, Rfl: 3   Fluticasone-Umeclidin-Vilant (TRELEGY ELLIPTA) 200-62.5-25 MCG/INH AEPB, Inhale 1 puff into the lungs daily., Disp: 14 each, Rfl: 0   insulin glargine (LANTUS) 100 UNIT/ML Solostar Pen, Inject 120 Units into the skin every morning. And pen needles 1/day, Disp: 120 mL, Rfl: 3   Insulin Pen Needle (B-D UF III MINI PEN NEEDLES) 31G X 5 MM MISC, USE AS DIRECTED  EVERY DAY, Disp: 100 each, Rfl: 3   Lancets (ONETOUCH DELICA PLUS [id number]) MISC,  TEST IN THE MORNING AND AT  BEDTIME, Disp: 200 each, Rfl: 3   ONETOUCH VERIO test strip, TEST IN THE MORNING AND AT  BEDTIME, Disp: 200 strip, Rfl: 3   OVER THE COUNTER MEDICATION, Take 4 capsules by mouth daily. Nitric oxide supplement, Disp: , Rfl:    OVER THE COUNTER MEDICATION, Take 1 application by mouth daily as needed. Pre workout vitamin supplement mix, Disp: , Rfl:    Protein POWD, Take 1 scoop by mouth daily as needed., Disp: , Rfl:    sildenafil (VIAGRA) 100 MG tablet, Take 0.5-1 tablets (50-100 mg total) by mouth daily as needed for erectile dysfunction., Disp: 6 tablet, Rfl: 19   sacubitril-valsartan (ENTRESTO) 49-51 MG, Take 1 tablet by mouth 2 (two) times daily., Disp: 180 tablet, Rfl: 0  Orders Placed This Encounter  Procedures   Basic metabolic panel   Magnesium    There are no Patient Instructions on file for this visit.   --Continue cardiac medications as reconciled in final medication list. --Return in about 6 weeks (around 10/03/2021) for Follow up. Or sooner if needed. --Continue follow-up with your primary care physician regarding the management of your other chronic comorbid conditions.  Patient's questions and concerns were addressed to his satisfaction. He voices understanding of the instructions provided during this encounter.   This note was created using a voice recognition software as a result there may be grammatical errors inadvertently enclosed that do not reflect the nature of this encounter. Every attempt is made to correct such errors.  Rex Kras, [place], [facility]  Pager: [phone] Office: [phone]

## 2021-09-03 LAB — BASIC METABOLIC PANEL
BUN/Creatinine Ratio: 23 — ABNORMAL HIGH (ref 9–20)
BUN: 46 mg/dL — ABNORMAL HIGH (ref 6–24)
CO2: 21 mmol/L (ref 20–29)
Calcium: 11 mg/dL — ABNORMAL HIGH (ref 8.7–10.2)
Chloride: 108 mmol/L — ABNORMAL HIGH (ref 96–106)
Creatinine, Ser: 1.96 mg/dL — ABNORMAL HIGH (ref 0.76–1.27)
Glucose: 73 mg/dL (ref 70–99)
Potassium: 4.5 mmol/L (ref 3.5–5.2)
Sodium: 144 mmol/L (ref 134–144)
eGFR: 39 mL/min/{1.73_m2} — ABNORMAL LOW (ref >59)

## 2021-09-03 LAB — MAGNESIUM: Magnesium: 1.4 mg/dL — ABNORMAL LOW (ref 1.6–2.3)

## 2021-09-03 LAB — PRO B NATRIURETIC PEPTIDE: NT-Pro BNP: 41 pg/mL (ref 0–210)

## 2021-09-05 NOTE — Progress Notes (Signed)
Patient didn't answer left a vm to call back

## 2021-09-08 ENCOUNTER — Telehealth: Payer: Self-pay | Admitting: Endocrinology

## 2021-09-08 ENCOUNTER — Other Ambulatory Visit (HOSPITAL_COMMUNITY): Payer: Self-pay

## 2021-09-08 ENCOUNTER — Other Ambulatory Visit: Payer: Self-pay

## 2021-09-08 DIAGNOSIS — I429 Cardiomyopathy, unspecified: Secondary | ICD-10-CM

## 2021-09-08 DIAGNOSIS — E1165 Type 2 diabetes mellitus with hyperglycemia: Secondary | ICD-10-CM

## 2021-09-08 DIAGNOSIS — Z794 Long term (current) use of insulin: Secondary | ICD-10-CM

## 2021-09-08 MED ORDER — ENTRESTO 49-51 MG PO TABS: 1.0000 | ORAL_TABLET | Freq: Two times a day (BID) | ORAL | 0 refills | Status: DC

## 2021-09-08 MED ORDER — MAGNESIUM 200 MG PO TABS: 200.0000 mg | ORAL_TABLET | Freq: Three times a day (TID) | ORAL | 0 refills | Status: DC

## 2021-09-08 MED ORDER — MAGNESIUM OXIDE -MG SUPPLEMENT 200 MG PO TABS: 1.0000 | ORAL_TABLET | Freq: Three times a day (TID) | ORAL | 0 refills | Status: DC

## 2021-09-08 NOTE — Telephone Encounter (Signed)
Message sent thru MyChart 

## 2021-09-08 NOTE — Telephone Encounter (Signed)
All Dexcom G6 supplies (transmitter, sensor, receiver) were approved 9.13.22 to 9.13.23. Please see encounter from 9.13.22.

## 2021-09-08 NOTE — Progress Notes (Signed)
Patient called back he is aware of results and prescription has been sent

## 2021-09-08 NOTE — Telephone Encounter (Signed)
Pt calling about Dexcom Prior Authorization for Dexcom supplies and transmitters sent to [facility].  [facility] [phone]  Pt contact [phone]

## 2021-09-09 ENCOUNTER — Telehealth: Payer: Self-pay | Admitting: Pulmonary Disease

## 2021-09-09 NOTE — Telephone Encounter (Signed)
-----   Message from Freddi Starr, MD sent at 09/08/2021  1:18 PM EST ----- Regarding: follow up appt Patient needs follow up with me. Looks like he wasn't scheduled for follow up after his visit in August. Please schedule at the earliest follow up slot.  Thanks, Wille Glaser

## 2021-09-09 NOTE — Telephone Encounter (Signed)
I called the patient and left a message to make the first available follow up with Dr. Erin Fulling per the previous message.

## 2021-09-10 ENCOUNTER — Other Ambulatory Visit: Payer: Self-pay

## 2021-09-10 DIAGNOSIS — I429 Cardiomyopathy, unspecified: Secondary | ICD-10-CM

## 2021-09-24 ENCOUNTER — Other Ambulatory Visit: Payer: Self-pay | Admitting: Endocrinology

## 2021-09-24 DIAGNOSIS — N529 Male erectile dysfunction, unspecified: Secondary | ICD-10-CM

## 2021-09-26 ENCOUNTER — Other Ambulatory Visit: Payer: Self-pay

## 2021-09-26 DIAGNOSIS — N529 Male erectile dysfunction, unspecified: Secondary | ICD-10-CM

## 2021-09-26 MED ORDER — SILDENAFIL CITRATE 100 MG PO TABS: 50.0000 mg | ORAL_TABLET | Freq: Every day | ORAL | 19 refills | Status: DC | PRN

## 2021-09-29 LAB — BASIC METABOLIC PANEL
BUN: 36 — AB (ref 4–21)
CO2: 28 — AB (ref 13–22)
Chloride: 104 (ref 99–108)
Creatinine: 1.7 — AB (ref 0.6–1.3)
Glucose: 167
Potassium: 4.1 (ref 3.4–5.3)
Sodium: 141 (ref 137–147)

## 2021-09-29 LAB — COMPREHENSIVE METABOLIC PANEL
Calcium: 10 (ref 8.7–10.7)
GFR calc Af Amer: 46

## 2021-09-29 LAB — VITAMIN D 25 HYDROXY (VIT D DEFICIENCY, FRACTURES): Vit D, 25-Hydroxy: 37.6

## 2021-09-29 LAB — MICROALBUMIN, URINE: Microalb, Ur: 842.6

## 2021-10-01 ENCOUNTER — Other Ambulatory Visit: Payer: Self-pay | Admitting: Internal Medicine

## 2021-10-01 DIAGNOSIS — N2889 Other specified disorders of kidney and ureter: Secondary | ICD-10-CM

## 2021-10-01 DIAGNOSIS — N1831 Chronic kidney disease, stage 3a: Secondary | ICD-10-CM

## 2021-10-07 ENCOUNTER — Encounter: Payer: Self-pay | Admitting: Internal Medicine

## 2021-10-09 IMAGING — CT CT CARDIAC CORONARY ARTERY CALCIUM SCORE
3 series · 12 of 20 positions shown, 14 images · non-contrast
Comparison: Prior CT of the chest on 04/10/2015

CLINICAL DATA: 55-year-old African American male with history of
hyperlipidemia, diabetes, family history of heart disease and prior
smoking history.

EXAM:
CT CARDIAC CORONARY ARTERY CALCIUM SCORE
TECHNIQUE: Non-contrast imaging through the heart was performed using
prospective ECG gating. Image post processing was performed on an
independent workstation, allowing for quantitative analysis of the
heart and coronary arteries. Note that this exam targets the heart
and the chest was not imaged in its entirety.

[Series 2: calcium scoring 2.00 qr36 bestdiast 71% hrt calciu · axial · 0.39mm/px · z∈[+1749,+1779]mm · 2 of 77 slices shown]
[im 16/77  vessel]
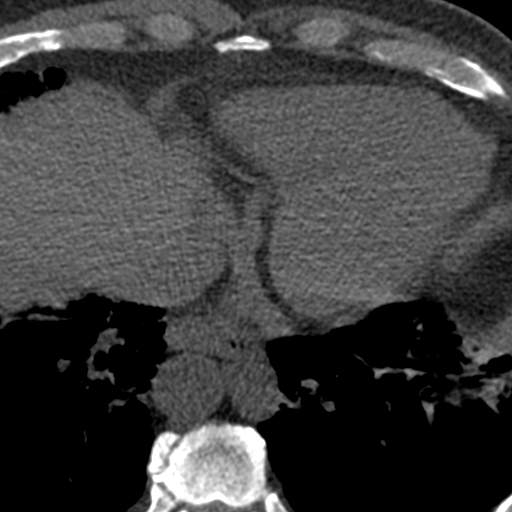
[im 31/77  vessel]
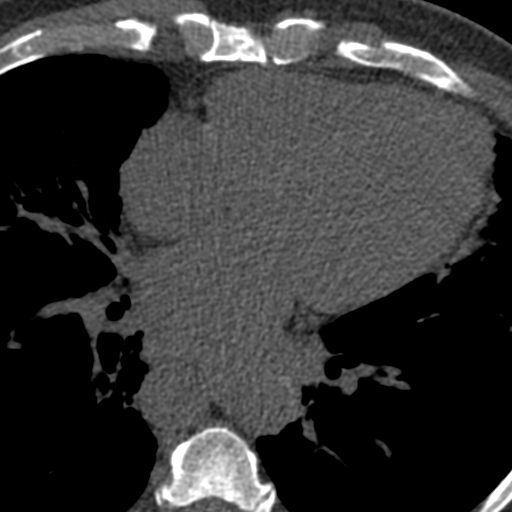

[Series 3: calcium scoring 2.00 br40 bestdiast 71% axial · axial · 0.54mm/px · z∈[+1743,+1845]mm · 5 of 77 slices shown, 7 images]
[im 13/77  vessel]
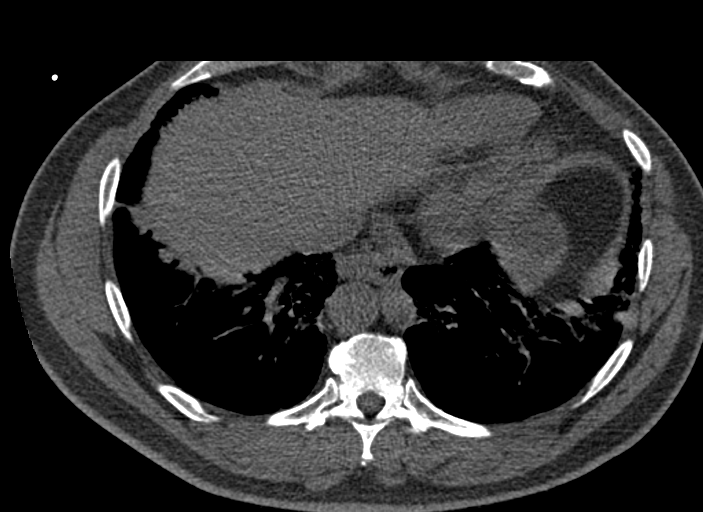
[im 13/77  lung]
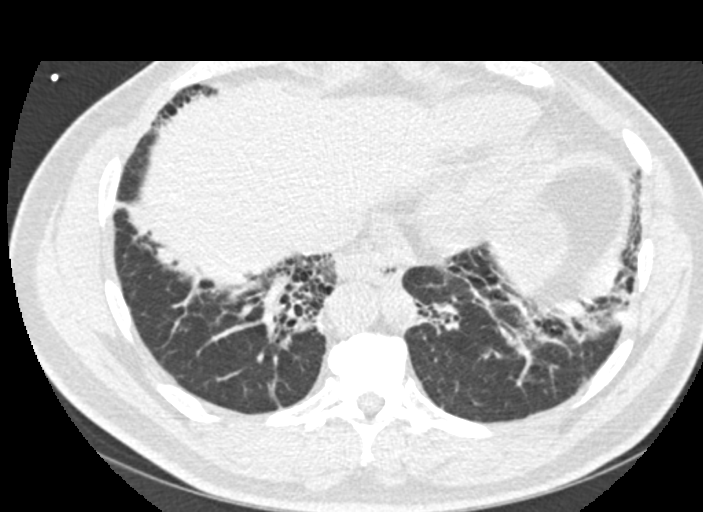
[im 26/77  vessel]
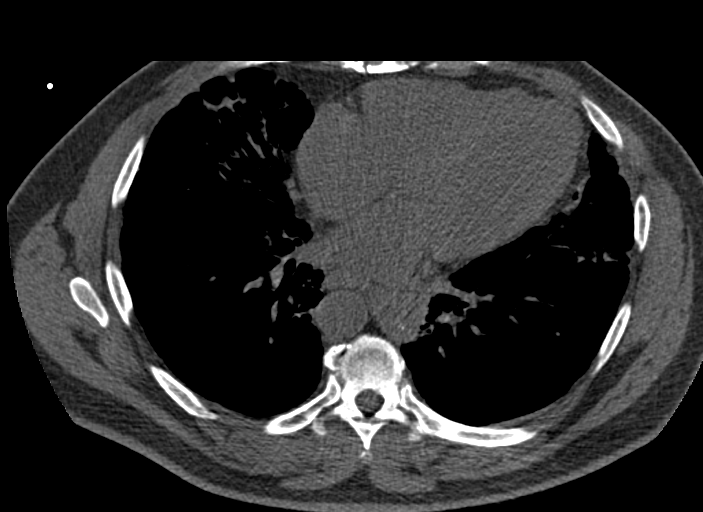
[im 39/77  vessel]
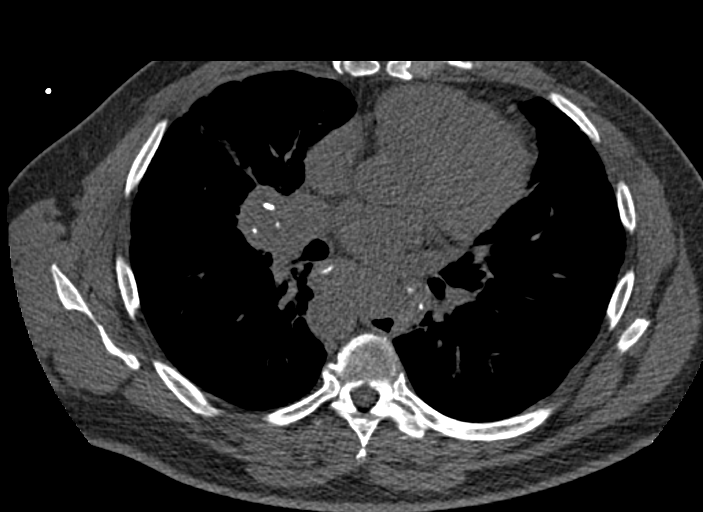
[im 51/77  vessel]
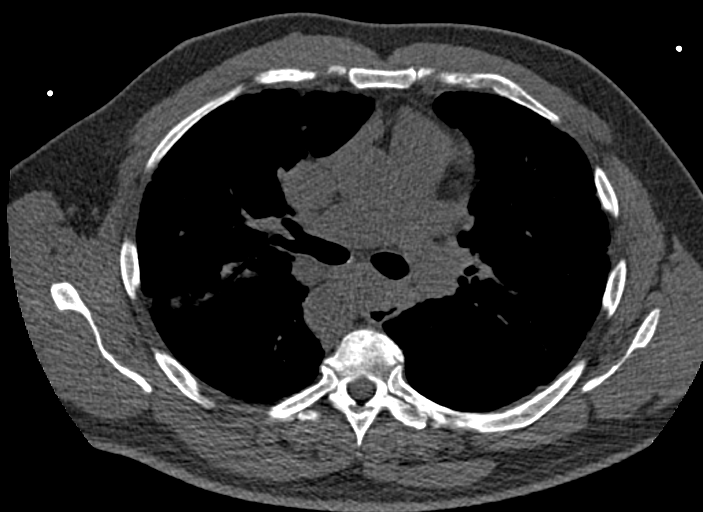
[im 64/77  vessel]
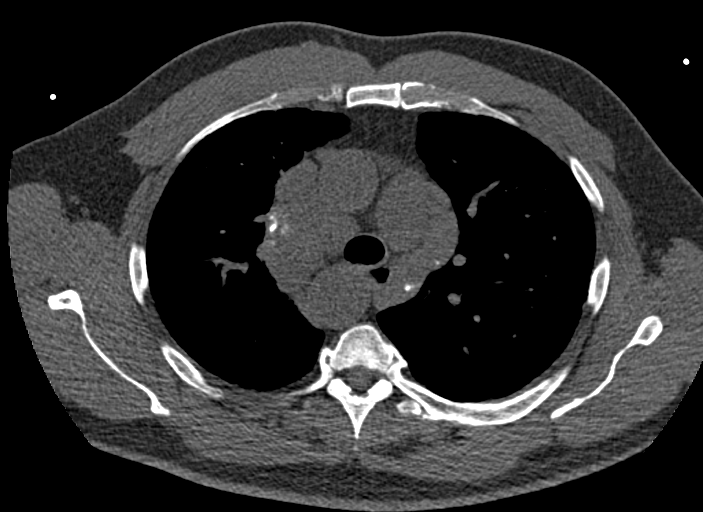
[im 64/77  lung]
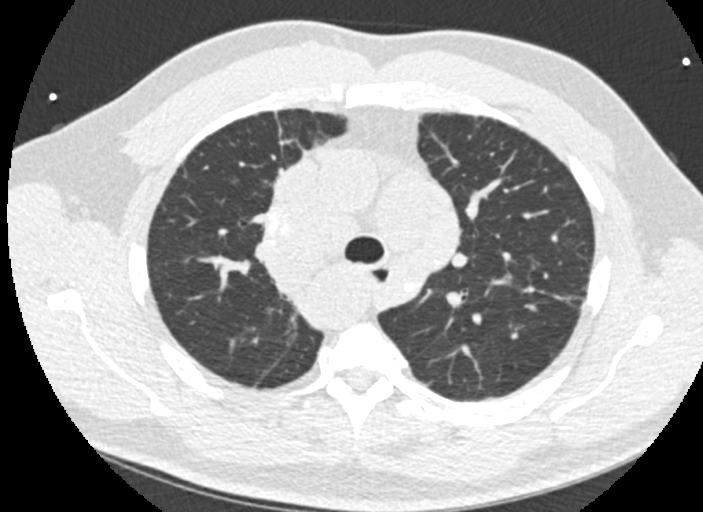

[Series 9: calcium scoring 2.00 br60 bestdiast 71% lungs · axial · 0.54mm/px · z∈[+1743,+1845]mm · 5 of 77 slices shown]
[im 13/77  vessel]
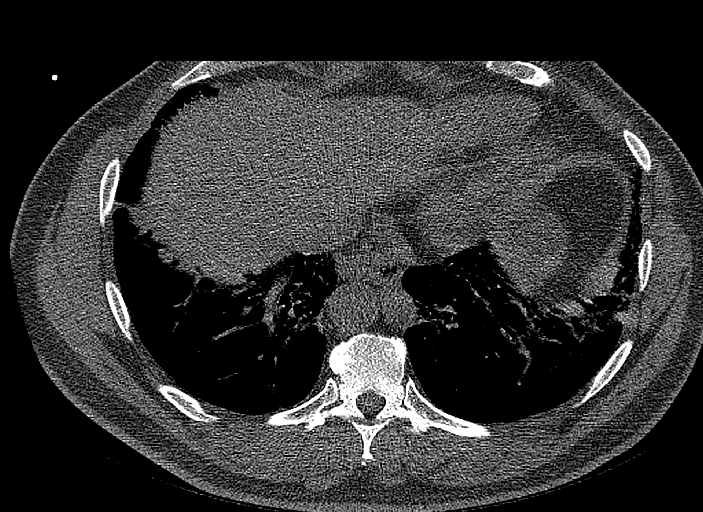
[im 26/77  vessel]
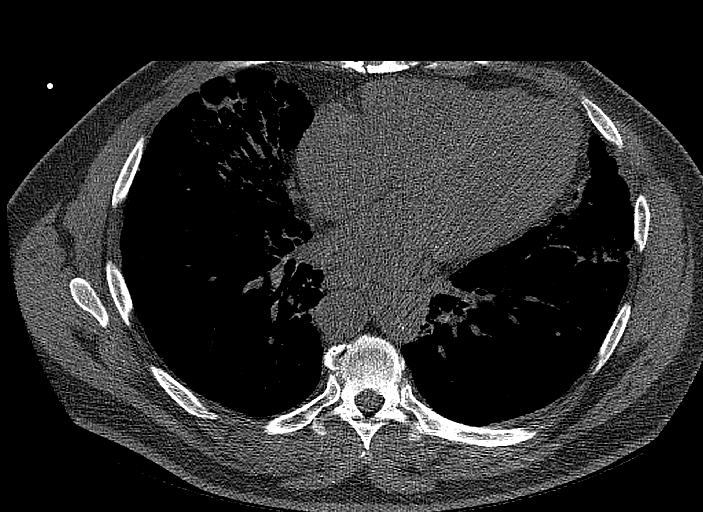
[im 39/77  vessel]
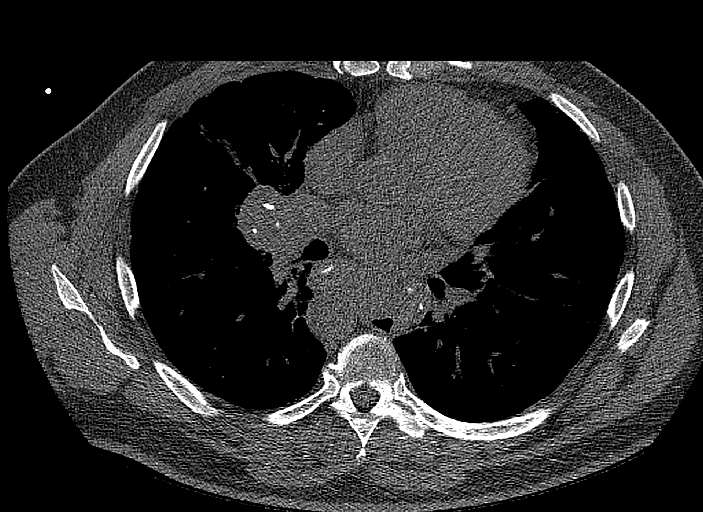
[im 51/77  vessel]
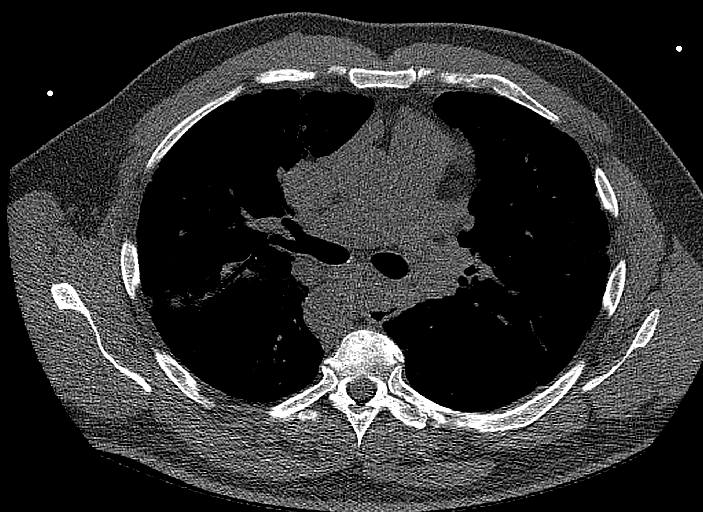
[im 64/77  vessel]
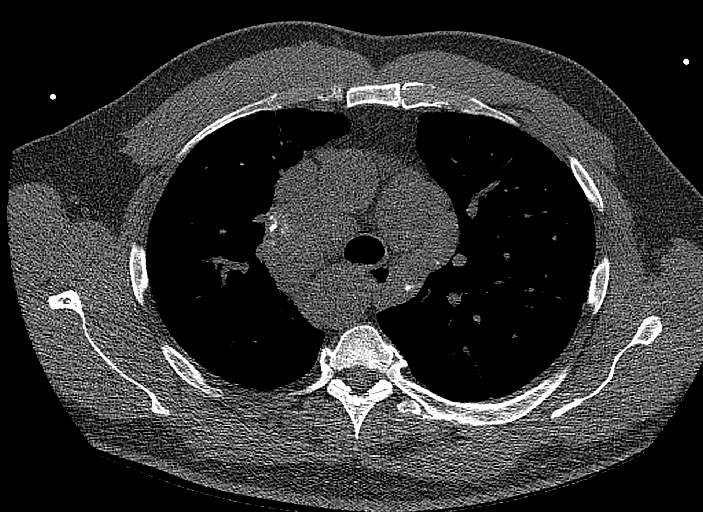

[12 of 20 positions shown; findings below may reference images not displayed]

FINDINGS: CORONARY CALCIUM SCORES:

Left Main: 0

LAD: 0

LCx: 0

RCA: 0

Total Agatston Score: 0

[HOSPITAL] percentile: 0

AORTA MEASUREMENTS:

Ascending Aorta: 31 mm

Descending Aorta: 26 mm

OTHER FINDINGS:

The heart size is within normal limits. No pericardial fluid is
identified. Note is again made of a right-sided aortic arch.
Visualized segments of the thoracic aorta and central pulmonary
arteries are normal in caliber. Visualized mediastinum and hilar
regions again demonstrate prominent lymph nodes, many of which are
partially and heavily calcified. Lymphadenopathy appears grossly
stable since 0665 supporting probable benign etiology with
combination of calcification and lung disease supporting probable
underlying sarcoidosis. Significant underlying fibrotic lung disease
associated with cystic bronchiectasis of the lower lobes, right
middle lobe and lingula appears relatively stable since the prior
CT. Visualized lungs show no evidence of pulmonary edema,
consolidation, pneumothorax, nodule or pleural fluid. Visualized
upper abdomen and bony structures are unremarkable.
IMPRESSION: 1. Coronary calcium score of 0.
2. Stable mediastinal and hilar lymphadenopathy with multiple
calcified lymph nodes. Stability since 0665 supports benign etiology
and is felt to most likely relate to sarcoidosis.
3. Relatively stable significant underlying fibrotic lung disease
with associated extensive bronchiectasis. Findings support lung
disease associated with sarcoidosis.
4. Stable right-sided aortic arch.

## 2021-10-10 ENCOUNTER — Encounter: Payer: Self-pay | Admitting: Internal Medicine

## 2021-10-10 ENCOUNTER — Other Ambulatory Visit (HOSPITAL_COMMUNITY): Payer: Self-pay

## 2021-10-10 ENCOUNTER — Ambulatory Visit (INDEPENDENT_AMBULATORY_CARE_PROVIDER_SITE_OTHER): Payer: 59 | Admitting: Internal Medicine

## 2021-10-10 ENCOUNTER — Telehealth: Payer: Self-pay

## 2021-10-10 VITALS — BP 118/82 | HR 91 | Temp 97.8°F | Resp 16 | Ht 71.0 in | Wt 209.2 lb

## 2021-10-10 DIAGNOSIS — Z23 Encounter for immunization: Secondary | ICD-10-CM | POA: Diagnosis not present

## 2021-10-10 DIAGNOSIS — D649 Anemia, unspecified: Secondary | ICD-10-CM

## 2021-10-10 DIAGNOSIS — E785 Hyperlipidemia, unspecified: Secondary | ICD-10-CM

## 2021-10-10 DIAGNOSIS — Z0001 Encounter for general adult medical examination with abnormal findings: Secondary | ICD-10-CM

## 2021-10-10 DIAGNOSIS — I429 Cardiomyopathy, unspecified: Secondary | ICD-10-CM

## 2021-10-10 DIAGNOSIS — E1122 Type 2 diabetes mellitus with diabetic chronic kidney disease: Secondary | ICD-10-CM

## 2021-10-10 DIAGNOSIS — N1831 Chronic kidney disease, stage 3a: Secondary | ICD-10-CM

## 2021-10-10 DIAGNOSIS — I10 Essential (primary) hypertension: Secondary | ICD-10-CM

## 2021-10-10 DIAGNOSIS — Z0000 Encounter for general adult medical examination without abnormal findings: Secondary | ICD-10-CM | POA: Diagnosis not present

## 2021-10-10 DIAGNOSIS — Z794 Long term (current) use of insulin: Secondary | ICD-10-CM

## 2021-10-10 LAB — CBC WITH DIFFERENTIAL/PLATELET
Basophils Absolute: 0.1 10*3/uL (ref 0.0–0.1)
Basophils Relative: 1.3 % (ref 0.0–3.0)
Eosinophils Absolute: 0.3 10*3/uL (ref 0.0–0.7)
Eosinophils Relative: 5.1 % — ABNORMAL HIGH (ref 0.0–5.0)
HCT: 35.5 % — ABNORMAL LOW (ref 39.0–52.0)
Hemoglobin: 11.9 g/dL — ABNORMAL LOW (ref 13.0–17.0)
Lymphocytes Relative: 14.7 % (ref 12.0–46.0)
Lymphs Abs: 0.7 10*3/uL (ref 0.7–4.0)
MCHC: 33.6 g/dL (ref 30.0–36.0)
MCV: 88.8 fl (ref 78.0–100.0)
Monocytes Absolute: 1 10*3/uL (ref 0.1–1.0)
Monocytes Relative: 19.7 % — ABNORMAL HIGH (ref 3.0–12.0)
Neutro Abs: 3 10*3/uL (ref 1.4–7.7)
Neutrophils Relative %: 59.2 % (ref 43.0–77.0)
Platelets: 197 10*3/uL (ref 150.0–400.0)
RBC: 3.99 Mil/uL — ABNORMAL LOW (ref 4.22–5.81)
RDW: 13 % (ref 11.5–15.5)
WBC: 5 10*3/uL (ref 4.0–10.5)

## 2021-10-10 LAB — LIPID PANEL
Cholesterol: 169 mg/dL (ref 0–200)
HDL: 39.7 mg/dL (ref >39.00)
LDL Cholesterol: 95 mg/dL (ref 0–99)
NonHDL: 129.76
Total CHOL/HDL Ratio: 4
Triglycerides: 174 mg/dL — ABNORMAL HIGH (ref 0.0–149.0)
VLDL: 34.8 mg/dL (ref 0.0–40.0)

## 2021-10-10 LAB — IBC + FERRITIN
Ferritin: 533.3 ng/mL — ABNORMAL HIGH (ref 22.0–322.0)
Iron: 125 ug/dL (ref 42–165)
Saturation Ratios: 32.5 % (ref 20.0–50.0)
TIBC: 385 ug/dL (ref 250.0–450.0)
Transferrin: 275 mg/dL (ref 212.0–360.0)

## 2021-10-10 LAB — PSA: PSA: 1.74 ng/mL (ref 0.10–4.00)

## 2021-10-10 NOTE — Telephone Encounter (Signed)
[facility] Encounter  Prior Authorization for Trulicity 0.75mg /0.64ml pen injector has been approved.    PA#: [id number]  Effective dates: 10/10/21 through 10/10/22  Per Test Claim Patients co-pay is N/A.   [facility] Fax:  [phone]

## 2021-10-10 NOTE — Patient Instructions (Addendum)
Check the  blood pressure regularly °BP GOAL is between 110/65 and  135/85. °If it is consistently higher or lower, let me know °  ° °GO TO THE LAB : Get the blood work   ° ° °GO TO THE FRONT DESK, PLEASE SCHEDULE YOUR APPOINTMENTS °Come back for   a physical exam in 1 year °

## 2021-10-10 NOTE — Progress Notes (Signed)
Subjective:    Patient ID: Wayne Webb, male    DOB: 12/06/1965, 55 y.o.   MRN: [id number]  DOS:  10/10/2021 Type of visit - description: cpx  Since the last office visit, saw cardiology d/t chest pain, was diagnosed with cardiomyopathy, chart reviewed. Saw pulmonary, chart reviewed. In general feels well. Still has some chest pain, atypical. Denies any cough, mild DOE going up stairs. Denies any GI or GU symptoms  Review of Systems  Other than above, a 14 point review of systems is negative       Past Medical History:  Diagnosis Date   ALLERGIC RHINITIS 03/15/2007   CRI (chronic renal insufficiency)    CRI, creat 1.4 , renal u/s 04-2011 neg   DIABETES MELLITUS, TYPE II 03/15/2007   Diplopia 12/24/2009   Elevated LFTs    ERECTILE DYSFUNCTION 03/15/2007   GOITER, MULTINODULAR 03/21/2010   HYPERLIPIDEMIA 03/15/2007    Past Surgical History:  Procedure Laterality Date   LASIK Left 10/2012   Social History   Socioeconomic History   Marital status: Legally Separated    Spouse name: Not on file   Number of children: 1   Years of education: Not on file   Highest education level: Not on file  Occupational History   Occupation: active job ([employer])    Comment: Maintenence   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    [place]: pt and daughter    Social Determinants of Health   Financial Resource Strain: Not on file  Food Insecurity: Not on file  Transportation Needs: Not on file  Physical Activity: Not on file  Stress: Not on file  Social Connections: Not on  file  Intimate Partner Violence: Not on file    Allergies as of 10/10/2021   No Known Allergies      Medication List        Accurate as of October 10, 2021 11:59 PM. If you have any questions, ask your nurse or doctor.          aspirin EC 81 MG tablet Take 81 mg by mouth daily.   azelastine 0.1 % nasal spray Commonly known as: ASTELIN Place 2 sprays into both nostrils at bedtime as needed for rhinitis or allergies.   B-D UF III MINI PEN NEEDLES 31G X 5 MM Misc Generic drug: Insulin Pen Needle USE AS DIRECTED EVERY DAY   Dexcom G6 Sensor Misc 1 Device by Does not apply route See admin instructions. Change every 10 days   Entresto 49-51 MG Generic drug: sacubitril-valsartan Take 1 tablet by mouth 2 (two) times daily.   ezetimibe 10 MG tablet Commonly known as: Zetia Take 1 tablet (10 mg total) by mouth daily.   insulin glargine 100 UNIT/ML Solostar Pen Commonly known as: LANTUS Inject 120 Units into the skin every morning. And pen needles 1/day   losartan 50 MG tablet Commonly known as: COZAAR Take 50 mg by  mouth daily.   Magnesium Oxide 200 MG Tabs Take 1 tablet (200 mg total) by mouth 3 (three) times daily.   OneTouch Delica Plus [id number] Misc TEST IN THE MORNING AND AT  BEDTIME   OneTouch Verio test strip Generic drug: glucose blood TEST IN THE MORNING AND AT  BEDTIME   OneTouch Verio w/Device Kit 1 each by Does not apply route 2 (two) times daily. E11.9   OVER THE COUNTER MEDICATION Take 4 capsules by mouth daily. Nitric oxide supplement   OVER THE COUNTER MEDICATION Take 1 application by mouth daily as needed. Pre workout vitamin supplement mix   Protein Powd Take 1 scoop by mouth daily as needed.   sildenafil 100 MG tablet Commonly known as: VIAGRA Take 0.5-1 tablets (50-100 mg total) by mouth daily as needed for erectile dysfunction.   Trelegy Ellipta 200-62.5-25 MCG/ACT Aepb Generic drug: Fluticasone-Umeclidin-Vilant Inhale 1 puff  into the lungs daily.   Trulicity 6.50 PT/4.6FK Sopn Generic drug: Dulaglutide Inject 0.75 mg into the skin once a week.           Objective:   Physical Exam BP 118/82 (BP Location: Left Arm, Patient Position: Sitting, Cuff Size: Normal)    Pulse 91    Temp 97.8 F (36.6 C) (Oral)    Resp 16    Ht 5' 11"  (1.803 m)    Wt 209 lb 4 oz (94.9 kg)    SpO2 95%    BMI 29.18 kg/m  General: Well developed, NAD, BMI noted Neck: No  thyromegaly  HEENT:  Normocephalic . Face symmetric, atraumatic Lungs:  CTA bilaterally (question of dry crackles L base?) Normal respiratory effort, no intercostal retractions, no accessory muscle use. Heart: RRR,  no murmur.  Abdomen:  Not distended, soft, non-tender. No rebound or rigidity.   Lower extremities: no pretibial edema bilaterally  DRE: Minimal prostate enlargement Skin: Exposed areas without rash. Not pale. Not jaundice Neurologic:  alert & oriented X3.  Speech normal, gait appropriate for age and unassisted Strength symmetric and appropriate for age.  Psych: Cognition and judgment appear intact.  Cooperative with normal attention span and concentration.  Behavior appropriate. No anxious or depressed appearing.     Assessment    Assessment DM - dr Loanne Drilling Hyperlipidemia (h/o increased LFTs w/ simva 2013) Goiter -- per dr Loanne Drilling, last [place] 06/2016 stable CKD - renal US (-) 2012 ; not f/u  by nephrology as off 09/2018 ED Elevated LFTs Ultrasound 2015 negative,  hepatitis serologies (-) 01-2015: ferritin (-) previously slt elevated; wnl  transferrin saturation, ANA, ceruloplasmin, anti smooth muscles ab. Alpha 1 antitrypsin.  Saw GI 10/2017: U/S wnl, labs borderline abnormal was RX a liver Bx Pulmonary: --DOE --Chronic Cough, better after ACEi d/c . Dr. Melvyn Novas --Burnout sarcoidosis : abnormal CXR, Confirmed by CT, likely due to burnout sarcoidosis. CV:  Dr Terri Skains     -Cardiomyopathy: saw cards d/t CP, w/u 7- 2022: Coronary calcium score  0, stress test low risk, echo: 35 to 40%.       PLAN: Here for CPX Hyperlipidemia: On Zetia, checking labs DM: Per Endo Goiter: Per Endo CKD: Last creatinine is stable.  Follow-up by nephrology. Anemia noted on chart review, check labs Cardiomyopathy Since the last visit, saw cardiology several times due to chest pain. Chart reviewed,w/u: Work-up including: -Stress test, low risk. - Echocardiogram: EF 35 to 40% - CT cardiac scoring: 0 Abnormal chest MRI 04-2021, saw pulmonary, CT chest show "usual interstitial pneumonia", had extensive labs, see report,  PFTs pending RTC  1 year, CPX,sees multiple doctors    In addition to CPX, chronic medical problems were assessed, extensive chart review.  This visit occurred during the SARS-CoV-2 public health emergency.  Safety protocols were in place, including screening questions prior to the visit, additional usage of staff PPE, and extensive cleaning of exam room while observing appropriate contact time as indicated for disinfecting solutions.

## 2021-10-11 ENCOUNTER — Encounter: Payer: Self-pay | Admitting: Internal Medicine

## 2021-10-11 NOTE — Assessment & Plan Note (Addendum)
Here for CPX Hyperlipidemia: On Zetia, checking labs DM: Per Endo Goiter: Per Endo CKD: Last creatinine is stable.  Follow-up by nephrology. Anemia noted on chart review, check labs Cardiomyopathy Since the last visit, saw cardiology several times due to chest pain. Chart reviewed,w/u: Work-up including: -Stress test, low risk. - Echocardiogram: EF 35 to 40% - CT cardiac scoring: 0 Abnormal chest MRI 04-2021, saw pulmonary, CT chest show "usual interstitial pneumonia", had extensive labs, see report, PFTs pending RTC  1 year, CPX,sees multiple doctors

## 2021-10-11 NOTE — Assessment & Plan Note (Signed)
-  Td 2016 -  PNM 23: 2015   - prevnar : 2015 - PNM 20 today  -shingrix completed -covid vax rec booster recommended - Had a flu shot    - (+) FH prostate cancer , DRE today slightly large gland, check PSA. - labs: FLP CBC iron panel PSA  - CCS: Colonoscopy, 02/2014, 1 polyp, next 10 years - Diet exercise: Counseled at -Advance care planning package of information provided

## 2021-10-13 ENCOUNTER — Ambulatory Visit: Payer: 59 | Admitting: Cardiology

## 2021-10-13 ENCOUNTER — Other Ambulatory Visit: Payer: Self-pay

## 2021-10-13 ENCOUNTER — Encounter: Payer: Self-pay | Admitting: Cardiology

## 2021-10-13 VITALS — BP 145/76 | HR 95 | Resp 16 | Ht 71.0 in | Wt 209.0 lb

## 2021-10-13 DIAGNOSIS — E785 Hyperlipidemia, unspecified: Secondary | ICD-10-CM

## 2021-10-13 DIAGNOSIS — N1831 Chronic kidney disease, stage 3a: Secondary | ICD-10-CM

## 2021-10-13 DIAGNOSIS — Z8249 Family history of ischemic heart disease and other diseases of the circulatory system: Secondary | ICD-10-CM

## 2021-10-13 DIAGNOSIS — I10 Essential (primary) hypertension: Secondary | ICD-10-CM

## 2021-10-13 DIAGNOSIS — I429 Cardiomyopathy, unspecified: Secondary | ICD-10-CM

## 2021-10-13 DIAGNOSIS — Z87891 Personal history of nicotine dependence: Secondary | ICD-10-CM

## 2021-10-13 MED ORDER — ENTRESTO 49-51 MG PO TABS: 1.0000 | ORAL_TABLET | Freq: Two times a day (BID) | ORAL | 0 refills | Status: DC

## 2021-10-13 MED ORDER — PRAVASTATIN SODIUM 40 MG PO TABS: 40.0000 mg | ORAL_TABLET | Freq: Every evening | ORAL | 0 refills | Status: DC

## 2021-10-13 NOTE — Progress Notes (Signed)
Date:  10/13/2021   ID:  Wayne Webb, DOB 05-29-1966, MRN [id number]  PCP:  Wanda Plump, MD  Cardiologist:  Tessa Lerner, DO, [facility] (established care 05/22/2021)  Date: 10/13/21 Last Office Visit: 08/22/2021  Chief Complaint  Patient presents with   Cardiomyopathy   Follow-up    HPI  Wayne Webb is a 55 y.o. male who presents to the office with a chief complaint of "57-month follow-up follow-up for cardiomyopathy. " Patient's past medical history and cardiovascular risk factors include: hypertension, hyperlipidemia (not on statins due to intolerance), IDDM Type II, former smoker, premature CAD in family, erectile dysfunction.  He is referred to the office at the request of Wanda Plump, MD for evaluation of chest pain.  Initially referred for evaluation of chest pain and underwent an ischemic evaluation given his risk factors including insulin-dependent diabetes mellitus type 2.  Echocardiogram noted reduced LVEF concerning for cardiomyopathy and therefore nuclear stress test was performed which noted normal myocardial perfusion suggestive of possible nonischemic etiology.  Patient has been uptitrated on guideline directed medical therapy over the last several office visits.  At last visit he was prescribed Entresto 49/51 mg p.o. twice daily.  He tolerated the medication well he states that the prescriptions denied by the insurance company at the time of refill.  Therefore he is back on losartan.  During his prior lab work patient was noted to have relatively stable/improving renal function, NT proBNP within normal limits.  He was asked to follow-up with his diabetic doctor to see if starting SGLT2 inhibitor such as Marcelline Deist may be beneficial given his comorbidities.  However this is still pending.  Patient had recent labs as part of his yearly physical which were reviewed with him and at today's office visit.  LDL is within acceptable range but not at goal.  Patient is known to  have intolerance to statin therapy but has also only been on rosuvastatin thus far.  His recent labs also note downtrending hemoglobin.  He denies any signs of bleeding.  He is asked to follow-up with PCP.  FUNCTIONAL STATUS: Goes to the gym three times a week (treadmill or elliptical) for 30 minutes followed by resistance training.   ALLERGIES: No Known Allergies  MEDICATION LIST PRIOR TO VISIT: Current Meds  Medication Sig   aspirin EC 81 MG tablet Take 81 mg by mouth daily.   azelastine (ASTELIN) 0.1 % nasal spray Place 2 sprays into both nostrils at bedtime as needed for rhinitis or allergies.   Blood Glucose Monitoring Suppl (ONETOUCH VERIO) w/Device KIT 1 each by Does not apply route 2 (two) times daily. E11.9   Continuous Blood Gluc Sensor (DEXCOM G6 SENSOR) MISC 1 Device by Does not apply route See admin instructions. Change every 10 days   Dulaglutide (TRULICITY) 0.75 MG/0.5ML SOPN Inject 0.75 mg into the skin once a week.   ezetimibe (ZETIA) 10 MG tablet Take 1 tablet (10 mg total) by mouth daily.   Fluticasone-Umeclidin-Vilant (TRELEGY ELLIPTA) 200-62.5-25 MCG/INH AEPB Inhale 1 puff into the lungs daily.   insulin glargine (LANTUS) 100 UNIT/ML Solostar Pen Inject 120 Units into the skin every morning. And pen needles 1/day   Insulin Pen Needle (B-D UF III MINI PEN NEEDLES) 31G X 5 MM MISC USE AS DIRECTED EVERY DAY   Lancets (ONETOUCH DELICA PLUS LANCET30G) MISC TEST IN THE MORNING AND AT  BEDTIME   ONETOUCH VERIO test strip TEST IN THE MORNING AND AT  BEDTIME   OVER  THE COUNTER MEDICATION Take 4 capsules by mouth daily. Nitric oxide supplement   OVER THE COUNTER MEDICATION Take 1 application by mouth daily as needed. Pre workout vitamin supplement mix   pravastatin (PRAVACHOL) 40 MG tablet Take 1 tablet (40 mg total) by mouth every evening.   Protein POWD Take 1 scoop by mouth daily as needed.   sildenafil (VIAGRA) 100 MG tablet Take 0.5-1 tablets (50-100 mg total) by mouth  daily as needed for erectile dysfunction.   [DISCONTINUED] losartan (COZAAR) 50 MG tablet Take 50 mg by mouth in the morning and at bedtime.     PAST MEDICAL HISTORY: Past Medical History:  Diagnosis Date   ALLERGIC RHINITIS 03/15/2007   CRI (chronic renal insufficiency)    CRI, creat 1.4 , renal u/s 04-2011 neg   DIABETES MELLITUS, TYPE II 03/15/2007   Diplopia 12/24/2009   Elevated LFTs    ERECTILE DYSFUNCTION 03/15/2007   GOITER, MULTINODULAR 03/21/2010   HYPERLIPIDEMIA 03/15/2007    PAST SURGICAL HISTORY: Past Surgical History:  Procedure Laterality Date   LASIK Left 10/2012    FAMILY HISTORY: The patient family history includes Diabetes (age of onset: 75) in his mother; Heart disease in his mother; Hypertension in his mother; Prostate cancer (age of onset: 50) in his father.  SOCIAL HISTORY:  The patient  reports that he quit smoking about 27 years ago. His smoking use included cigarettes. He has a 5.00 pack-year smoking history. He has never used smokeless tobacco. He reports that he does not drink alcohol and does not use drugs.  REVIEW OF SYSTEMS: Review of Systems  Constitutional: Negative for chills and fever.  HENT:  Negative for hoarse voice and nosebleeds.   Eyes:  Negative for discharge, double vision and pain.  Cardiovascular:  Negative for chest pain, claudication, dyspnea on exertion, leg swelling, near-syncope, orthopnea, palpitations, paroxysmal nocturnal dyspnea and syncope.  Respiratory:  Negative for hemoptysis and shortness of breath.   Musculoskeletal:  Negative for muscle cramps and myalgias.  Gastrointestinal:  Negative for abdominal pain, constipation, diarrhea, hematemesis, hematochezia, melena, nausea and vomiting.  Neurological:  Negative for dizziness and light-headedness.   PHYSICAL EXAM: Vitals with BMI 10/13/2021 10/10/2021 08/22/2021  Height $Remov'5\' 11"'uKeJGs$  $RemoveB'5\' 11"'ZGPOXnVO$  $RemoveBef'5\' 11"'UmLWFYCEyA$   Weight 209 lbs 209 lbs 4 oz 208 lbs  BMI 29.16 66.0 63.01  Systolic 601 093 235   Diastolic 76 82 74  Pulse 95 91 93    CONSTITUTIONAL: Well-developed and well-nourished. No acute distress.  SKIN: Skin is warm and dry. No rash noted. No cyanosis. No pallor. No jaundice HEAD: Normocephalic and atraumatic.  EYES: No scleral icterus MOUTH/THROAT: Moist oral membranes.  NECK: No JVD present. No thyromegaly noted. No carotid bruits  LYMPHATIC: No visible cervical adenopathy.  CHEST Normal respiratory effort. No intercostal retractions  LUNGS: Clear to auscultation bilaterally. No stridor. No wheezes. No rales.  CARDIOVASCULAR: Regular rate and rhythm, positive S1-S2, no murmurs rubs or gallops appreciated. ABDOMINAL: Soft, nontender, nondistended, positive bowel sounds all 4 quadrants. No apparent ascites.  EXTREMITIES: No peripheral edema  HEMATOLOGIC: No significant bruising NEUROLOGIC: Oriented to person, place, and time. Nonfocal. Normal muscle tone.  PSYCHIATRIC: Normal mood and affect. Normal behavior. Cooperative  CARDIAC DATABASE: EKG: 05/22/2021: Normal sinus rhythm, 90 bpm, LVH per voltage criteria, without underlying ischemia or injury pattern, as nonspecific ST-T changes most likely secondary to LVH.  06/18/2021: Left ventricle cavity is normal in size. Moderate concentric hypertrophy of the left ventricle. Normal global wall motion. Moderate global hypokinesis. LVEF  35-40%. Normal diastolic filling pattern. Left atrial cavity is mildly dilated. Mild (Grade I) aortic regurgitation. Mild (Grade I) mitral regurgitation. Mild tricuspid regurgitation. No evidence of pulmonary hypertension. ~~~Personally reviewed the images LVEF appears to be 45-50% with grade 1 diastolic impairment.  And mild global hypokinesis ~~~  Stress Testing: Exercise Sestamibi stress test 06/18/2021: 1 Day Rest/Stress Protocol. Exercise time 5 minutes 12 seconds on Bruce protocol, achieved 7.05 METS, 90% of APMHR.  Normal myocardial perfusion without convincing evidence of reversible  myocardial ischemia or prior infarct. LV cavity mildly dilated, wall thickness is preserved, without regional wall motion abnormalities. Calculated LVEF 46%, visually appears preserved. Low risk study.  Heart Catheterization: None   Coronary calcium scoring: 06/16/2021: 1. Coronary calcium score of 0. 2. Stable mediastinal and hilar lymphadenopathy with multiple calcified lymph nodes. Stability since 2016 supports benign etiology and is felt to most likely relate to sarcoidosis. 3. Relatively stable significant underlying fibrotic lung disease with associated extensive bronchiectasis. Findings support lung disease associated with sarcoidosis. 4. Stable right-sided aortic arch.   LABORATORY DATA: CBC Latest Ref Rng & Units 10/10/2021 06/09/2021 03/07/2021  WBC 4.0 - 10.5 K/uL 5.0 - 5.4  Hemoglobin 13.0 - 17.0 g/dL 11.9(L) 10.4(A) 11.9(A)  Hematocrit 39.0 - 52.0 % 35.5(L) - 35(A)  Platelets 150.0 - 400.0 K/uL 197.0 - 193    CMP Latest Ref Rng & Units 09/29/2021 09/02/2021 06/09/2021  Glucose 70 - 99 mg/dL - 73 -  BUN 4 - 21 36(A) 46(H) 48(A)  Creatinine 0.6 - 1.3 1.7(A) 1.96(H) 2.3(A)  Sodium 137 - 147 141 144 141  Potassium 3.4 - 5.3 4.1 4.5 4.1  Chloride 99 - 108 104 108(H) 103  CO2 13 - 22 28(A) 21 26(A)  Calcium 8.7 - 10.7 10.0 11.0(H) 11.3(A)  Total Protein 6.0 - 8.3 g/dL - - -  Total Bilirubin 0.2 - 1.2 mg/dL - - -  Alkaline Phos 25 - 125 - - -  AST 14 - 40 - - -  ALT 10 - 40 - - -    Lipid Panel  Lab Results  Component Value Date   CHOL 169 10/10/2021   HDL 39.70 10/10/2021   LDLCALC 95 10/10/2021   LDLDIRECT 78.0 04/02/2020   TRIG 174.0 (H) 10/10/2021   CHOLHDL 4 10/10/2021    No components found for: NTPROBNP Recent Labs    09/02/21 1126  PROBNP 41   Recent Labs    12/17/20 1601  TSH 1.03    BMP Recent Labs    12/18/20 1101 12/18/20 1620 12/25/20 1215 01/29/21 0803 01/29/21 0803 03/07/21 0000 06/09/21 0000 09/02/21 1127 09/29/21 0000  NA 129*    < > 133* 137   < > 143 141 144 141  K 3.9   < > 4.1 4.5  --  4.1 4.1 4.5 4.1  CL 90*  --  96 100  --  101 103 108* 104  CO2 25  --  30 29  --  31* 26* 21 28*  GLUCOSE 519*  --  362* 186*  --   --   --  73  --   BUN 63*  --  42* 35*   < > 40* 48* 46* 36*  CREATININE 2.59*  --  1.86* 2.16*   < > 1.9* 2.3* 1.96* 1.7*  CALCIUM 10.7*  --  10.3 11.1*  --  11.4* 11.3* 11.0* 10.0  GFRNONAA 28*  --   --   --   --   --   --   --   --  GFRAA  --   --   --   --   --  40 32  --  46   < > = values in this interval not displayed.    HEMOGLOBIN A1C Lab Results  Component Value Date   HGBA1C 5.2 07/07/2021   MPG 243 (H) 11/03/2012    IMPRESSION:    ICD-10-CM   1. Cardiomyopathy, presumed nonischemic  I42.9 sacubitril-valsartan (ENTRESTO) 49-51 MG    2. Type 2 diabetes mellitus with stage 3a chronic kidney disease, with long-term current use of insulin (HCC)  E11.22 sacubitril-valsartan (ENTRESTO) 49-51 MG   N18.31 Lipid Panel With LDL/HDL Ratio   Z79.4 LDL cholesterol, direct    CMP14+EGFR    pravastatin (PRAVACHOL) 40 MG tablet    3. Type 2 diabetes mellitus with hyperlipidemia (HCC)  E11.69 Lipid Panel With LDL/HDL Ratio   E78.5 LDL cholesterol, direct    CMP14+EGFR    pravastatin (PRAVACHOL) 40 MG tablet    4. Essential hypertension  I10     5. Family history of premature CAD  Z82.49 Lipid Panel With LDL/HDL Ratio    LDL cholesterol, direct    CMP14+EGFR    pravastatin (PRAVACHOL) 40 MG tablet    6. Former smoker  Z87.891        RECOMMENDATIONS: DENMAN PICHARDO is a 55 y.o. male whose past medical history and cardiac risk factors include: hypertension, hyperlipidemia (not on statins due to intolerance), IDDM Type II, former smoker, premature CAD in family, erectile dysfunction.  Cardiomyopathy, presumed nonischemic Overall euvolemic and not in congestive heart failure. Stage B, NYHA class II Echo: Reported LVEF 35-40%, on personal review LVEF more towards 45-50%, grade 1  diastolic impairment and mild global hypokinesis. Coronary artery calcium score: Total CAC 0 Stress test: Low risk study Did well on Entresto 49/51 mg p.o. twice daily but unable to refill due to insurance limitations.   Will be sent a prescription for Entresto 49/51 mg p.o. twice daily and if needed our office could work with his insurance company to have it approved.  Until than he is asked to continue his losartan.  If and when Delene Loll is approved patient is aware that he needs to stop losartan prior to initiating Entresto.  He can always call the office if any questions or concerns arise.   Given his diabetes recommend following up with PCP /endocrinology with regards to considering SGLT2 inhibitors.  Essential hypertension Office blood pressures currently not at goal. Medications reconciled Reemphasized the importance of low-salt diet.  Type 2 diabetes mellitus with hyperglycemia, with long-term current use of insulin (HCC) Currently on insulin therapy. Most recent hemoglobin A1c within acceptable range. Educated in the importance of glycemic control. Currently on ARB will try to get [facility] approved  Most recent lipid profile reviewed.  LDL currently not at goal. Patient is willing to start pravastatin 40 mg p.o. nightly.  We will check fasting lipid profile in 6 weeks prior to the next office visit.  Family history of premature CAD Total coronary calcium score of 0. However, given his insulin-dependent diabetes would still recommend statin therapy. Patient has tried rosuvastatin 5 mg in the past which caused myalgias. Pravastatin 40 mg p.o. nightly.  Closely monitor.  Medication profile discussed with the patient  Former smoker Educated on the importance of continued smoking cessation.  FINAL MEDICATION LIST END OF ENCOUNTER: Meds ordered this encounter  Medications   sacubitril-valsartan (ENTRESTO) 49-51 MG    Sig: Take 1 tablet by mouth 2 (  two) times daily.    Dispense:   180 tablet    Refill:  0   pravastatin (PRAVACHOL) 40 MG tablet    Sig: Take 1 tablet (40 mg total) by mouth every evening.    Dispense:  90 tablet    Refill:  0     Medications Discontinued During This Encounter  Medication Reason   Magnesium Oxide 200 MG TABS    sacubitril-valsartan (ENTRESTO) 49-51 MG    losartan (COZAAR) 50 MG tablet Change in therapy     Current Outpatient Medications:    aspirin EC 81 MG tablet, Take 81 mg by mouth daily., Disp: , Rfl:    azelastine (ASTELIN) 0.1 % nasal spray, Place 2 sprays into both nostrils at bedtime as needed for rhinitis or allergies., Disp: 90 mL, Rfl: 3   Blood Glucose Monitoring Suppl (ONETOUCH VERIO) w/Device KIT, 1 each by Does not apply route 2 (two) times daily. E11.9, Disp: 1 kit, Rfl: 0   Continuous Blood Gluc Sensor (DEXCOM G6 SENSOR) MISC, 1 Device by Does not apply route See admin instructions. Change every 10 days, Disp: 9 each, Rfl: 3   Dulaglutide (TRULICITY) 6.23 JS/2.8BT SOPN, Inject 0.75 mg into the skin once a week., Disp: 6 mL, Rfl: 3   ezetimibe (ZETIA) 10 MG tablet, Take 1 tablet (10 mg total) by mouth daily., Disp: 90 tablet, Rfl: 3   Fluticasone-Umeclidin-Vilant (TRELEGY ELLIPTA) 200-62.5-25 MCG/INH AEPB, Inhale 1 puff into the lungs daily., Disp: 14 each, Rfl: 0   insulin glargine (LANTUS) 100 UNIT/ML Solostar Pen, Inject 120 Units into the skin every morning. And pen needles 1/day, Disp: 120 mL, Rfl: 3   Insulin Pen Needle (B-D UF III MINI PEN NEEDLES) 31G X 5 MM MISC, USE AS DIRECTED EVERY DAY, Disp: 100 each, Rfl: 3   Lancets (ONETOUCH DELICA PLUS [id number]) MISC, TEST IN THE MORNING AND AT  BEDTIME, Disp: 200 each, Rfl: 3   ONETOUCH VERIO test strip, TEST IN THE MORNING AND AT  BEDTIME, Disp: 200 strip, Rfl: 3   OVER THE COUNTER MEDICATION, Take 4 capsules by mouth daily. Nitric oxide supplement, Disp: , Rfl:    OVER THE COUNTER MEDICATION, Take 1 application by mouth daily as needed. Pre workout vitamin  supplement mix, Disp: , Rfl:    pravastatin (PRAVACHOL) 40 MG tablet, Take 1 tablet (40 mg total) by mouth every evening., Disp: 90 tablet, Rfl: 0   Protein POWD, Take 1 scoop by mouth daily as needed., Disp: , Rfl:    sildenafil (VIAGRA) 100 MG tablet, Take 0.5-1 tablets (50-100 mg total) by mouth daily as needed for erectile dysfunction., Disp: 6 tablet, Rfl: 19   sacubitril-valsartan (ENTRESTO) 49-51 MG, Take 1 tablet by mouth 2 (two) times daily., Disp: 180 tablet, Rfl: 0  Orders Placed This Encounter  Procedures   Lipid Panel With LDL/HDL Ratio   LDL cholesterol, direct   CMP14+EGFR    There are no Patient Instructions on file for this visit.   --Continue cardiac medications as reconciled in final medication list. --Return in about 7 weeks (around 12/01/2021) for Follow up, Lipid. Or sooner if needed. --Continue follow-up with your primary care physician regarding the management of your other chronic comorbid conditions.  Patient's questions and concerns were addressed to his satisfaction. He voices understanding of the instructions provided during this encounter.   This note was created using a voice recognition software as a result there may be grammatical errors inadvertently enclosed that do not reflect the  reflect the nature of this encounter. Every attempt is made to correct such errors.  Rex Kras, [place], [facility]  Pager: [phone] Office: [phone]

## 2021-10-16 ENCOUNTER — Telehealth: Payer: Self-pay

## 2021-10-16 NOTE — Telephone Encounter (Signed)
Wayne Webb was denied again for this patient. Do you want me to appeal?

## 2021-10-20 NOTE — Telephone Encounter (Signed)
Yes ST

## 2021-10-21 ENCOUNTER — Ambulatory Visit
Admission: RE | Admit: 2021-10-21 | Discharge: 2021-10-21 | Disposition: A | Discharge status: Home / Self Care | Payer: 59 | Source: Ambulatory Visit | Attending: Internal Medicine | Admitting: Internal Medicine

## 2021-10-21 DIAGNOSIS — N1831 Chronic kidney disease, stage 3a: Secondary | ICD-10-CM

## 2021-10-21 DIAGNOSIS — N2889 Other specified disorders of kidney and ureter: Secondary | ICD-10-CM

## 2021-10-21 MED ORDER — GADOBENATE DIMEGLUMINE 529 MG/ML IV SOLN
19.0000 mL | Freq: Once | INTRAVENOUS | Status: AC | PRN
  Administered 2021-10-21: 10:00:00 19 mL via INTRAVENOUS

## 2021-10-30 ENCOUNTER — Other Ambulatory Visit: Payer: Self-pay

## 2021-10-30 DIAGNOSIS — Z8249 Family history of ischemic heart disease and other diseases of the circulatory system: Secondary | ICD-10-CM

## 2021-10-30 DIAGNOSIS — E1122 Type 2 diabetes mellitus with diabetic chronic kidney disease: Secondary | ICD-10-CM

## 2021-10-30 DIAGNOSIS — E1169 Type 2 diabetes mellitus with other specified complication: Secondary | ICD-10-CM

## 2021-10-30 DIAGNOSIS — Z794 Long term (current) use of insulin: Secondary | ICD-10-CM

## 2021-10-30 DIAGNOSIS — E785 Hyperlipidemia, unspecified: Secondary | ICD-10-CM

## 2021-11-01 ENCOUNTER — Other Ambulatory Visit: Payer: Self-pay | Admitting: Internal Medicine

## 2021-11-12 ENCOUNTER — Ambulatory Visit (INDEPENDENT_AMBULATORY_CARE_PROVIDER_SITE_OTHER): Payer: 59 | Admitting: Endocrinology

## 2021-11-12 ENCOUNTER — Other Ambulatory Visit: Payer: Self-pay

## 2021-11-12 VITALS — BP 130/76 | HR 81 | Ht 71.0 in | Wt 213.4 lb

## 2021-11-12 DIAGNOSIS — N1831 Chronic kidney disease, stage 3a: Secondary | ICD-10-CM

## 2021-11-12 DIAGNOSIS — Z794 Long term (current) use of insulin: Secondary | ICD-10-CM | POA: Diagnosis not present

## 2021-11-12 DIAGNOSIS — E1122 Type 2 diabetes mellitus with diabetic chronic kidney disease: Secondary | ICD-10-CM | POA: Diagnosis not present

## 2021-11-12 LAB — POCT GLYCOSYLATED HEMOGLOBIN (HGB A1C): Hemoglobin A1C: 5.3 % (ref 4.0–5.6)

## 2021-11-12 MED ORDER — TRULICITY 1.5 MG/0.5ML ~~LOC~~ SOAJ: 1.5000 mg | SUBCUTANEOUS | 3 refills | Status: DC

## 2021-11-12 MED ORDER — INSULIN GLARGINE 100 UNIT/ML SOLOSTAR PEN: 90.0000 [IU] | PEN_INJECTOR | SUBCUTANEOUS | 3 refills | Status: DC

## 2021-11-12 NOTE — Patient Instructions (Addendum)
Please double the Trulicity, and reduce the Lantus to 90 units each morning. check your blood sugar twice a day.  vary the time of day when you check, between before the 3 meals, and at bedtime.  also check if you have symptoms of your blood sugar being too high or too low.  please keep a record of the readings and bring it to your next appointment here (or you can bring the meter itself).  You can write it on any piece of paper.  please call us sooner if your blood sugar goes below 70, or if most of your readings are over 200.   Please come back for a follow-up appointment in 2-3 months.

## 2021-11-12 NOTE — Progress Notes (Signed)
Subjective:    Patient ID: Wayne Webb, male    DOB: 1966/01/19, 56 y.o.   MRN: [id number]  HPI Pt returns for f/u of diabetes mellitus:  DM type: Insulin-requiring type 2 Dx'ed: 3825 Complications: DR and CRI.   Therapy: insulin since 2011.  DKA: never Severe hypoglycemia: never.  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   Other: he declines multiple daily injections; fructosamine converts to higher A1c than A1c itself.   Interval history: pt states he feels well in general.  Pt says he never misses the insulin. I reviewed continuous glucose monitor data.  glucose varies from 60-210.  It is in general highest at 9AM, 5PM, and 12MN.  It decreases overnight.  He takes meds as rx'ed.   Past Medical History:  Diagnosis Date   ALLERGIC RHINITIS 03/15/2007   CRI (chronic renal insufficiency)    CRI, creat 1.4 , renal u/s 04-2011 neg   DIABETES MELLITUS, TYPE II 03/15/2007   Diplopia 12/24/2009   Elevated LFTs    ERECTILE DYSFUNCTION 03/15/2007   GOITER, MULTINODULAR 03/21/2010   HYPERLIPIDEMIA 03/15/2007    Past Surgical History:  Procedure Laterality Date   LASIK Left 10/2012    Social History   Socioeconomic History   Marital status: Divorced    Spouse name: Not on file   Number of children: 1   Years of education: Not on file   Highest education level: Not on file  Occupational History   Occupation: active job ([employer])    Comment: Maintenence   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    [place]: pt and daughter    Social Determinants of Radio broadcast assistant Strain: Not on file  Food Insecurity: Not on file  Transportation Needs: Not on file  Physical Activity: Not on file  Stress: Not on file  Social Connections: Not on file  Intimate Partner Violence: Not on file    Current Outpatient Medications on File Prior to Visit  Medication Sig Dispense Refill   aspirin EC 81 MG tablet Take 81 mg by mouth daily.     Azelastine HCl 137 MCG/SPRAY SOLN INHALE 2 SPRAYS INTO BOTH NOSTRIL AT BEDTIME AS NEEDED FOR RHINITIS OR ALLERGIES 30 mL 5   Blood Glucose Monitoring Suppl (ONETOUCH VERIO) w/Device KIT 1 each by Does not apply route 2 (two) times daily. E11.9 1 kit 0   Continuous Blood Gluc Sensor (DEXCOM G6 SENSOR) MISC 1 Device by Does not apply route See admin instructions. Change every 10 days 9 each 3   ezetimibe (ZETIA) 10 MG tablet TAKE 1 TABLET BY MOUTH EVERY DAY 90 tablet 3   Fluticasone-Umeclidin-Vilant (TRELEGY ELLIPTA) 200-62.5-25 MCG/INH AEPB Inhale 1 puff into the lungs daily. 14 each 0   Insulin Pen Needle (B-D UF III MINI PEN NEEDLES) 31G X 5 MM  MISC USE AS DIRECTED EVERY DAY 100 each 3   Lancets (ONETOUCH DELICA PLUS [id number]) MISC TEST IN THE MORNING AND AT  BEDTIME 200 each 3   ONETOUCH VERIO test strip TEST IN THE MORNING AND AT  BEDTIME 200 strip 3   OVER THE COUNTER MEDICATION Take 4 capsules by mouth daily. Nitric oxide supplement     OVER THE COUNTER MEDICATION Take 1 application by mouth daily as needed. Pre workout vitamin supplement mix     pravastatin (PRAVACHOL) 40 MG tablet Take 1 tablet (40 mg total) by mouth every evening. 90 tablet 0   Protein POWD Take 1 scoop by mouth daily as needed.     sacubitril-valsartan (ENTRESTO) 49-51 MG Take 1 tablet by mouth 2 (two) times daily. 180 tablet 0   sildenafil (VIAGRA) 100 MG tablet Take 0.5-1 tablets (50-100 mg total) by mouth daily as needed for erectile dysfunction. 6 tablet 19    No current facility-administered medications on file prior to visit.    No Known Allergies  Family History  Problem Relation Age of Onset   Heart disease Mother        CABG-onset in her early 67's   Diabetes Mother 5       had DM from her 17's   Hypertension Mother    Prostate cancer Father 59       dx in his 11s   Stroke Neg Hx    Colon cancer Neg Hx     BP 130/76    Pulse 81    Ht 5' 11"  (1.803 m)    Wt 213 lb 6.4 oz (96.8 kg)    SpO2 93%    BMI 29.76 kg/m    Review of Systems Denies N/V/HB    Objective:   Physical Exam    Lab Results  Component Value Date   HGBA1C 5.3 11/12/2021      Assessment & Plan:  Insulin-requiring type 2 DM: overcontrolled.    Patient Instructions  Please double the Trulicity, and reduce the Lantus to 90 units each morning. check your blood sugar twice a day.  vary the time of day when you check, between before the 3 meals, and at bedtime.  also check if you have symptoms of your blood sugar being too high or too low.  please keep a record of the readings and bring it to your next appointment here (or you can bring the meter itself).  You can write it on any piece of paper.  please call us sooner if your blood sugar goes below 70, or if most of your readings are over 200.   Please come back for a follow-up appointment in 2-3 months.

## 2021-11-14 ENCOUNTER — Other Ambulatory Visit: Payer: Self-pay | Admitting: Internal Medicine

## 2021-12-01 ENCOUNTER — Ambulatory Visit: Payer: 59 | Admitting: Cardiology

## 2021-12-13 ENCOUNTER — Other Ambulatory Visit: Payer: Self-pay | Admitting: Internal Medicine

## 2021-12-16 ENCOUNTER — Telehealth: Payer: Self-pay | Admitting: Internal Medicine

## 2021-12-16 ENCOUNTER — Other Ambulatory Visit: Payer: Self-pay | Admitting: Endocrinology

## 2021-12-16 NOTE — Telephone Encounter (Signed)
Pt is requesting discontinued medication. Please advise.   Medication: losartan (COZAAR) 50 MG tablet   Has the patient contacted their pharmacy? Yes.     Preferred Pharmacy: [id number] - [place], [address]  [place], [place] [address]  Phone:  [phone]  Fax:  [phone]

## 2021-12-16 NOTE — Telephone Encounter (Signed)
Please advise 

## 2021-12-17 MED ORDER — LOSARTAN POTASSIUM 50 MG PO TABS: 50.0000 mg | ORAL_TABLET | Freq: Every day | ORAL | 1 refills | Status: DC

## 2021-12-17 NOTE — Telephone Encounter (Signed)
Patient reports he only took entresto when he had samples from Cardiology but medication was never approved by insurance so he restarted the Losartan. Ok per Dr. Larose Kells, rx sent for 90 day supply to his pharmacy of choice.

## 2021-12-17 NOTE — Telephone Encounter (Signed)
LMOM asking for call back.  

## 2021-12-17 NOTE — Telephone Encounter (Signed)
Cardiology note from 10/13/2021: Patient needs to be on Entresto, if unable to afford then go back on losartan. Please explain that to the patient, refill losartan if appropriate. Do not take both medicines

## 2021-12-18 ENCOUNTER — Encounter: Payer: Self-pay | Admitting: Cardiology

## 2021-12-18 ENCOUNTER — Other Ambulatory Visit: Payer: Self-pay

## 2021-12-18 ENCOUNTER — Ambulatory Visit: Payer: 59 | Admitting: Cardiology

## 2021-12-18 VITALS — BP 135/80 | HR 94 | Temp 97.2°F | Resp 16 | Ht 71.0 in | Wt 209.0 lb

## 2021-12-18 DIAGNOSIS — N1831 Chronic kidney disease, stage 3a: Secondary | ICD-10-CM

## 2021-12-18 DIAGNOSIS — Z8249 Family history of ischemic heart disease and other diseases of the circulatory system: Secondary | ICD-10-CM

## 2021-12-18 DIAGNOSIS — Z87891 Personal history of nicotine dependence: Secondary | ICD-10-CM

## 2021-12-18 DIAGNOSIS — I429 Cardiomyopathy, unspecified: Secondary | ICD-10-CM

## 2021-12-18 DIAGNOSIS — Z794 Long term (current) use of insulin: Secondary | ICD-10-CM

## 2021-12-18 DIAGNOSIS — E1122 Type 2 diabetes mellitus with diabetic chronic kidney disease: Secondary | ICD-10-CM

## 2021-12-18 DIAGNOSIS — E1169 Type 2 diabetes mellitus with other specified complication: Secondary | ICD-10-CM

## 2021-12-18 DIAGNOSIS — E1165 Type 2 diabetes mellitus with hyperglycemia: Secondary | ICD-10-CM

## 2021-12-18 DIAGNOSIS — I10 Essential (primary) hypertension: Secondary | ICD-10-CM

## 2021-12-18 NOTE — Progress Notes (Signed)
Date:  12/18/2021   ID:  Wayne Webb, DOB 25-Dec-1965, Wayne Webb  PCP:  Colon Branch, MD  Cardiologist:  Rex Kras, DO, [facility] (established care 05/22/2021)  Date: 12/18/21 Last Office Visit: 10/13/2021  Chief Complaint  Patient presents with   Hyperlipidemia   Follow-up    HPI  Wayne Webb is a 56 y.o. male who presents to the office with a chief complaint of "3 month follow up for lipids and cardiomyopathy. " Patient's past medical history and cardiovascular risk factors include: hypertension with CKD stage III, hyperlipidemia (not on statins due to intolerance), IDDM Type II, former smoker, premature CAD in family, erectile dysfunction.  Initially referred to the practice for evaluation of chest pain.  He underwent ischemic work-up including an echo cardiogram and stress test given his risk factors of insulin-dependent diabetes, hypertension, former smoker and premature CAD in the family.  Echocardiogram noted mildly reduced LVEF; however, nuclear stress test noted normal myocardial perfusion suggestive of possible nonischemic cardiomyopathy.  His guideline directed medical therapy has been uptitrated in a stepwise fashion; however, patient states that Delene Loll was refused by his insurance company and therefore he continues to take losartan.  Of asked him to discuss with his PCP/endocrinologist with regards to initiating SGLT2 inhibitors given his comorbid conditions of insulin-dependent diabetes mellitus type 2.  Since last office visit patient has not had any precordial discomfort or heart failure exacerbation.  No hospitalizations or urgent care visits for cardiovascular symptoms.  FUNCTIONAL STATUS: Goes to the gym three times a week (treadmill or elliptical) for 30 minutes followed by resistance training.   ALLERGIES: No Known Allergies  MEDICATION LIST PRIOR TO VISIT: Current Meds  Medication Sig   Azelastine HCl 137 MCG/SPRAY SOLN INHALE 2 SPRAYS INTO  BOTH NOSTRIL AT BEDTIME AS NEEDED FOR RHINITIS OR ALLERGIES   Blood Glucose Monitoring Suppl (ONETOUCH VERIO) w/Device KIT 1 each by Does not apply route 2 (two) times daily. E11.9   Continuous Blood Gluc Sensor (DEXCOM G6 SENSOR) MISC 1 Device by Does not apply route See admin instructions. Change every 10 days   Dulaglutide (TRULICITY) 1.5 OF/7.5ZW SOPN Inject 1.5 mg into the skin once a week.   ezetimibe (ZETIA) 10 MG tablet TAKE 1 TABLET BY MOUTH EVERY DAY   Fluticasone-Umeclidin-Vilant (TRELEGY ELLIPTA) 200-62.5-25 MCG/INH AEPB Inhale 1 puff into the lungs daily.   Insulin Pen Needle (B-D UF III MINI PEN NEEDLES) 31G X 5 MM MISC USE AS DIRECTED EVERY DAY   Lancets (ONETOUCH DELICA PLUS [id number]) MISC TEST IN THE MORNING AND AT  BEDTIME   losartan (COZAAR) 50 MG tablet Take 1 tablet (50 mg total) by mouth daily.   ONETOUCH VERIO test strip TEST IN THE MORNING AND AT  BEDTIME   OVER THE COUNTER MEDICATION Take 4 capsules by mouth daily. Nitric oxide supplement   OVER THE COUNTER MEDICATION Take 1 application by mouth daily as needed. Pre workout vitamin supplement mix   pravastatin (PRAVACHOL) 40 MG tablet Take 1 tablet (40 mg total) by mouth every evening.   Protein POWD Take 1 scoop by mouth daily as needed.   sildenafil (VIAGRA) 100 MG tablet Take 0.5-1 tablets (50-100 mg total) by mouth daily as needed for erectile dysfunction.     PAST MEDICAL HISTORY: Past Medical History:  Diagnosis Date   ALLERGIC RHINITIS 03/15/2007   CRI (chronic renal insufficiency)    CRI, creat 1.4 , renal u/s 04-2011 neg   DIABETES MELLITUS, TYPE II 03/15/2007  Diplopia 12/24/2009   Elevated LFTs    ERECTILE DYSFUNCTION 03/15/2007   GOITER, MULTINODULAR 03/21/2010   HYPERLIPIDEMIA 03/15/2007    PAST SURGICAL HISTORY: Past Surgical History:  Procedure Laterality Date   LASIK Left 10/2012    FAMILY HISTORY: The patient family history includes Diabetes (age of onset: 78) in his mother; Heart disease in  his mother; Hypertension in his mother; Prostate cancer (age of onset: 40) in his father.  SOCIAL HISTORY:  The patient  reports that he quit smoking about 28 years ago. His smoking use included cigarettes. He has a 5.00 pack-year smoking history. He has never used smokeless tobacco. He reports that he does not drink alcohol and does not use drugs.  REVIEW OF SYSTEMS: Review of Systems  Cardiovascular:  Negative for chest pain, dyspnea on exertion, leg swelling, orthopnea, palpitations, paroxysmal nocturnal dyspnea and syncope.  Respiratory:  Negative for shortness of breath.   Hematologic/Lymphatic: Negative for bleeding problem.  Gastrointestinal:  Negative for hematemesis and hematochezia.   PHYSICAL EXAM: Vitals with BMI 12/18/2021 11/12/2021 10/13/2021  Height 5' 11"  5' 11"  5' 11"   Weight 209 lbs 213 lbs 6 oz 209 lbs  BMI 29.16 94.70 96.28  Systolic 366 294 765  Diastolic 80 76 76  Pulse 94 81 95    CONSTITUTIONAL: Well-developed and well-nourished. No acute distress.  SKIN: Skin is warm and dry. No rash noted. No cyanosis. No pallor. No jaundice HEAD: Normocephalic and atraumatic.  EYES: No scleral icterus MOUTH/THROAT: Moist oral membranes.  NECK: No JVD present. No thyromegaly noted. No carotid bruits  LYMPHATIC: No visible cervical adenopathy.  CHEST Normal respiratory effort. No intercostal retractions  LUNGS: Clear to auscultation bilaterally. No stridor. No wheezes. No rales.  CARDIOVASCULAR: Regular rate and rhythm, positive S1-S2, no murmurs rubs or gallops appreciated. ABDOMINAL: Soft, nontender, nondistended, positive bowel sounds all 4 quadrants. No apparent ascites.  EXTREMITIES: No peripheral edema  HEMATOLOGIC: No significant bruising NEUROLOGIC: Oriented to person, place, and time. Nonfocal. Normal muscle tone.  PSYCHIATRIC: Normal mood and affect. Normal behavior. Cooperative No change in physical examination since last office visit  CARDIAC  DATABASE: EKG: 12/18/2021: NSR, 92 bpm, LVH with nonspecific area, without underlying injury pattern, ST-T changes likely due to repolarization.   ECHO: 06/18/2021: Left ventricle cavity is normal in size. Moderate concentric hypertrophy of the left ventricle. Normal global wall motion. Moderate global hypokinesis. LVEF 35-40%. Normal diastolic filling pattern. Left atrial cavity is mildly dilated. Mild (Grade I) aortic regurgitation. Mild (Grade I) mitral regurgitation. Mild tricuspid regurgitation. No evidence of pulmonary hypertension. ~~~Personally reviewed the images LVEF appears to be 45-50% with grade 1 diastolic impairment.  And mild global hypokinesis ~~~  Stress Testing: Exercise Sestamibi stress test 06/18/2021: 1 Day Rest/Stress Protocol. Exercise time 5 minutes 12 seconds on Bruce protocol, achieved 7.05 METS, 90% of APMHR.  Normal myocardial perfusion without convincing evidence of reversible myocardial ischemia or prior infarct. LV cavity mildly dilated, wall thickness is preserved, without regional wall motion abnormalities. Calculated LVEF 46%, visually appears preserved. Low risk study.  Heart Catheterization: None   Coronary calcium scoring: 06/16/2021: 1. Coronary calcium score of 0. 2. Stable mediastinal and hilar lymphadenopathy with multiple calcified lymph nodes. Stability since 2016 supports benign etiology and is felt to most likely relate to sarcoidosis. 3. Relatively stable significant underlying fibrotic lung disease with associated extensive bronchiectasis. Findings support lung disease associated with sarcoidosis. 4. Stable right-sided aortic arch.   LABORATORY DATA: CBC Latest Ref Rng & Units 10/10/2021  06/09/2021 03/07/2021  WBC 4.0 - 10.5 K/uL 5.0 - 5.4  Hemoglobin 13.0 - 17.0 g/dL 11.9(L) 10.4(A) 11.9(A)  Hematocrit 39.0 - 52.0 % 35.5(L) - 35(A)  Platelets 150.0 - 400.0 K/uL 197.0 - 193    CMP Latest Ref Rng & Units 09/29/2021 09/02/2021 06/09/2021   Glucose 70 - 99 mg/dL - 73 -  BUN 4 - 21 36(A) 46(H) 48(A)  Creatinine 0.6 - 1.3 1.7(A) 1.96(H) 2.3(A)  Sodium 137 - 147 141 144 141  Potassium 3.4 - 5.3 4.1 4.5 4.1  Chloride 99 - 108 104 108(H) 103  CO2 13 - 22 28(A) 21 26(A)  Calcium 8.7 - 10.7 10.0 11.0(H) 11.3(A)  Total Protein 6.0 - 8.3 g/dL - - -  Total Bilirubin 0.2 - 1.2 mg/dL - - -  Alkaline Phos 25 - 125 - - -  AST 14 - 40 - - -  ALT 10 - 40 - - -    Lipid Panel  Lab Results  Component Value Date   CHOL 169 10/10/2021   HDL 39.70 10/10/2021   LDLCALC 95 10/10/2021   LDLDIRECT 78.0 04/02/2020   TRIG 174.0 (H) 10/10/2021   CHOLHDL 4 10/10/2021    No components found for: NTPROBNP Recent Labs    09/02/21 1126  PROBNP 41   No results for input(s): TSH in the last 8760 hours.   BMP Recent Labs    12/25/20 1215 01/29/21 0803 01/29/21 0803 03/07/21 0000 06/09/21 0000 09/02/21 1127 09/29/21 0000  NA 133* 137   < > 143 141 144 141  K 4.1 4.5  --  4.1 4.1 4.5 4.1  CL 96 100  --  101 103 108* 104  CO2 30 29  --  31* 26* 21 28*  GLUCOSE 362* 186*  --   --   --  73  --   BUN 42* 35*   < > 40* 48* 46* 36*  CREATININE 1.86* 2.16*   < > 1.9* 2.3* 1.96* 1.7*  CALCIUM 10.3 11.1*  --  11.4* 11.3* 11.0* 10.0  GFRAA  --   --   --  40 32  --  46   < > = values in this interval not displayed.    HEMOGLOBIN A1C Lab Results  Component Value Date   HGBA1C 5.3 11/12/2021   MPG 243 (H) 11/03/2012    IMPRESSION:    ICD-10-CM   1. Cardiomyopathy, unspecified type ([place])  I42.9 EKG 12-Lead    PCV ECHOCARDIOGRAM COMPLETE    2. Type 2 diabetes mellitus with stage 3a chronic kidney disease, with long-term current use of insulin (HCC)  E11.22    N18.31    Z79.4     3. Type 2 diabetes mellitus with hyperlipidemia (HCC)  E11.69 Lipid Panel With LDL/HDL Ratio   E78.5 LDL cholesterol, direct    CMP14+EGFR    4. Family history of premature CAD  Z82.49     5. Essential hypertension  I10     6. Former smoker   Z87.891     63. Type 2 diabetes mellitus with hyperglycemia, with long-term current use of insulin [facility])  E11.65    Z79.4        RECOMMENDATIONS: GERON MULFORD is a 56 y.o. male whose past medical history and cardiac risk factors include: hypertension, hyperlipidemia (not on statins due to intolerance), IDDM Type II, former smoker, premature CAD in family, erectile dysfunction.  Cardiomyopathy, unspecified type ([place]) Euvolemic. Suspect nonischemic cardiomyopathy Stage B, NYHA class II  Echo: Personally reviewed LVEF 61-60%, grade 1 diastolic impairment with mild global hypokinesis. Total CAC: 0 Stress test: Low risk study Was initiated on Entresto 49/51 mg p.o. twice daily but patient states that he is unable to fill it as insurance is denying it.  Currently on losartan. Consider Farxiga at the next office visit if labs remain stable. Plan echocardiogram prior to next office visit.  Check CMP, fasting lipid profile, direct LDL  Type 2 diabetes mellitus with stage 3a chronic kidney disease, with long-term current use of insulin (HCC) Currently on ARB, statin therapy Most recent hemoglobin A1c well controlled at 5.3  Type 2 diabetes mellitus with hyperlipidemia (HCC) Currently on pravastatin.   Rosuvastatin 5 mg p.o. nightly-has caused myalgias in the past. He denies myalgia or other side effects. Most recent lipids dated December 2022 reviewed as noted above.  Essential hypertension Office blood pressures are within acceptable range. Medications reconciled. We will try to appeal his Delene Loll denial for now.  Consider initiation of Farxiga at the next visit.  During his office encounter we discussed management of at least 2 chronic comorbid conditions, reviewed prior echo and stress test results, ordered additional diagnostic testing to reevaluate lipids and echocardiogram prior to next visit.  Questions and concerns were addressed to her satisfaction and coordination of  care.  FINAL MEDICATION LIST END OF ENCOUNTER: No orders of the defined types were placed in this encounter.    Medications Discontinued During This Encounter  Medication Reason   aspirin EC 81 MG tablet    insulin glargine (LANTUS) 100 UNIT/ML Solostar Pen      Current Outpatient Medications:    Azelastine HCl 137 MCG/SPRAY SOLN, INHALE 2 SPRAYS INTO BOTH NOSTRIL AT BEDTIME AS NEEDED FOR RHINITIS OR ALLERGIES, Disp: 30 mL, Rfl: 5   Blood Glucose Monitoring Suppl (ONETOUCH VERIO) w/Device KIT, 1 each by Does not apply route 2 (two) times daily. E11.9, Disp: 1 kit, Rfl: 0   Continuous Blood Gluc Sensor (DEXCOM G6 SENSOR) MISC, 1 Device by Does not apply route See admin instructions. Change every 10 days, Disp: 9 each, Rfl: 3   Dulaglutide (TRULICITY) 1.5 VP/7.1GG SOPN, Inject 1.5 mg into the skin once a week., Disp: 6 mL, Rfl: 3   ezetimibe (ZETIA) 10 MG tablet, TAKE 1 TABLET BY MOUTH EVERY DAY, Disp: 90 tablet, Rfl: 3   Fluticasone-Umeclidin-Vilant (TRELEGY ELLIPTA) 200-62.5-25 MCG/INH AEPB, Inhale 1 puff into the lungs daily., Disp: 14 each, Rfl: 0   Insulin Pen Needle (B-D UF III MINI PEN NEEDLES) 31G X 5 MM MISC, USE AS DIRECTED EVERY DAY, Disp: 100 each, Rfl: 3   Lancets (ONETOUCH DELICA PLUS [id number]) MISC, TEST IN THE MORNING AND AT  BEDTIME, Disp: 200 each, Rfl: 3   losartan (COZAAR) 50 MG tablet, Take 1 tablet (50 mg total) by mouth daily., Disp: 90 tablet, Rfl: 1   ONETOUCH VERIO test strip, TEST IN THE MORNING AND AT  BEDTIME, Disp: 200 strip, Rfl: 3   OVER THE COUNTER MEDICATION, Take 4 capsules by mouth daily. Nitric oxide supplement, Disp: , Rfl:    OVER THE COUNTER MEDICATION, Take 1 application by mouth daily as needed. Pre workout vitamin supplement mix, Disp: , Rfl:    pravastatin (PRAVACHOL) 40 MG tablet, Take 1 tablet (40 mg total) by mouth every evening., Disp: 90 tablet, Rfl: 0   Protein POWD, Take 1 scoop by mouth daily as needed., Disp: , Rfl:    sildenafil (VIAGRA)  100 MG tablet, Take 0.5-1  tablets (50-100 mg total) by mouth daily as needed for erectile dysfunction., Disp: 6 tablet, Rfl: 19  Orders Placed This Encounter  Procedures   Lipid Panel With LDL/HDL Ratio   LDL cholesterol, direct   CMP14+EGFR   EKG 12-Lead   PCV ECHOCARDIOGRAM COMPLETE    There are no Patient Instructions on file for this visit.   --Continue cardiac medications as reconciled in final medication list. --Return in about 27 weeks (around 06/25/2022) for Follow up cardiomyopathy . Or sooner if needed. --Continue follow-up with your primary care physician regarding the management of your other chronic comorbid conditions.  Patient's questions and concerns were addressed to his satisfaction. He voices understanding of the instructions provided during this encounter.   This note was created using a voice recognition software as a result there may be grammatical errors inadvertently enclosed that do not reflect the nature of this encounter. Every attempt is made to correct such errors.  Rex Kras, [place], [facility]  Pager: [phone] Office: [phone]

## 2021-12-23 LAB — CMP14+EGFR
ALT: 79 IU/L — ABNORMAL HIGH (ref 0–44)
AST: 72 IU/L — ABNORMAL HIGH (ref 0–40)
Albumin/Globulin Ratio: 1.4 (ref 1.2–2.2)
Albumin: 4.3 g/dL (ref 3.8–4.9)
Alkaline Phosphatase: 143 IU/L — ABNORMAL HIGH (ref 44–121)
BUN/Creatinine Ratio: 19 (ref 9–20)
BUN: 40 mg/dL — ABNORMAL HIGH (ref 6–24)
Bilirubin Total: 0.7 mg/dL (ref 0.0–1.2)
CO2: 25 mmol/L (ref 20–29)
Calcium: 10.4 mg/dL — ABNORMAL HIGH (ref 8.7–10.2)
Chloride: 103 mmol/L (ref 96–106)
Creatinine, Ser: 2.1 mg/dL — ABNORMAL HIGH (ref 0.76–1.27)
Globulin, Total: 3.1 g/dL (ref 1.5–4.5)
Glucose: 75 mg/dL (ref 70–99)
Potassium: 4.2 mmol/L (ref 3.5–5.2)
Sodium: 141 mmol/L (ref 134–144)
Total Protein: 7.4 g/dL (ref 6.0–8.5)
eGFR: 36 mL/min/{1.73_m2} — ABNORMAL LOW (ref >59)

## 2021-12-23 LAB — LIPID PANEL WITH LDL/HDL RATIO
Cholesterol, Total: 136 mg/dL (ref 100–199)
HDL: 39 mg/dL — ABNORMAL LOW (ref >39)
LDL Chol Calc (NIH): 65 mg/dL (ref 0–99)
LDL/HDL Ratio: 1.7 ratio (ref 0.0–3.6)
Triglycerides: 190 mg/dL — ABNORMAL HIGH (ref 0–149)
VLDL Cholesterol Cal: 32 mg/dL (ref 5–40)

## 2021-12-23 LAB — LDL CHOLESTEROL, DIRECT: LDL Direct: 66 mg/dL (ref 0–99)

## 2021-12-25 NOTE — Progress Notes (Signed)
I tried calling patient no answer left a vm to call back

## 2021-12-26 NOTE — Progress Notes (Signed)
I spoke to patient he voiced understanding. Patient stated he wanted to try first lifestyle changes watch what he consume first before considering medication

## 2021-12-31 ENCOUNTER — Other Ambulatory Visit: Payer: Self-pay | Admitting: Cardiology

## 2021-12-31 DIAGNOSIS — Z8249 Family history of ischemic heart disease and other diseases of the circulatory system: Secondary | ICD-10-CM

## 2021-12-31 DIAGNOSIS — E1169 Type 2 diabetes mellitus with other specified complication: Secondary | ICD-10-CM

## 2021-12-31 DIAGNOSIS — E785 Hyperlipidemia, unspecified: Secondary | ICD-10-CM

## 2021-12-31 DIAGNOSIS — E1122 Type 2 diabetes mellitus with diabetic chronic kidney disease: Secondary | ICD-10-CM

## 2022-01-29 ENCOUNTER — Encounter: Payer: Self-pay | Admitting: Internal Medicine

## 2022-02-10 ENCOUNTER — Ambulatory Visit: Payer: 59 | Admitting: Endocrinology

## 2022-02-17 ENCOUNTER — Ambulatory Visit (INDEPENDENT_AMBULATORY_CARE_PROVIDER_SITE_OTHER): Payer: 59 | Admitting: Endocrinology

## 2022-02-17 VITALS — BP 142/90 | HR 94 | Ht 71.0 in | Wt 211.8 lb

## 2022-02-17 DIAGNOSIS — E1122 Type 2 diabetes mellitus with diabetic chronic kidney disease: Secondary | ICD-10-CM | POA: Diagnosis not present

## 2022-02-17 DIAGNOSIS — N1831 Chronic kidney disease, stage 3a: Secondary | ICD-10-CM | POA: Diagnosis not present

## 2022-02-17 DIAGNOSIS — Z794 Long term (current) use of insulin: Secondary | ICD-10-CM | POA: Diagnosis not present

## 2022-02-17 LAB — POCT GLYCOSYLATED HEMOGLOBIN (HGB A1C): Hemoglobin A1C: 5.6 % (ref 4.0–5.6)

## 2022-02-17 MED ORDER — INSULIN GLARGINE 100 UNIT/ML SOLOSTAR PEN: 80.0000 [IU] | PEN_INJECTOR | SUBCUTANEOUS | 1 refills | Status: DC

## 2022-02-17 NOTE — Patient Instructions (Addendum)
Please continue the same Trulicity, and reduce the Lantus to 80 units each morning. ?check your blood sugar twice a day.  vary the time of day when you check, between before the 3 meals, and at bedtime.  also check if you have symptoms of your blood sugar being too high or too low.  please keep a record of the readings and bring it to your next appointment here (or you can bring the meter itself).  You can write it on any piece of paper.  please call us sooner if your blood sugar goes below 70, or if most of your readings are over 200.   ?You should have an endocrinology follow-up appointment in 3 months.    ? ?

## 2022-02-17 NOTE — Progress Notes (Signed)
? ?Subjective:  ? ? Patient ID: Wayne Webb, male    DOB: [birth year], 56 y.o.   MRN: [id number] ? ?HPI ?Pt returns for f/u of diabetes mellitus:  ?DM type: Insulin-requiring type 2 ?Dx'ed: 2004 ?Complications: DR and CRI.   ?Therapy: insulin since 2011.  ?DKA: never ?Severe hypoglycemia: never.  ?Pancreatitis: never ?SDOH: he works 1st shift (he was on 3rd shift, so Tyler Aas was therefore preferred, but ins has declined to continue).   ?Other: he declines multiple daily injections; fructosamine converts to higher A1c than A1c itself.   ?Interval history: pt states he feels well in general.  Pt says he never misses the insulin. I reviewed continuous glucose monitor data.  glucose varies from 55-210.  It is in general highest at 9PM, and lowest at 6AM and 10AM.  It decreases slightly overnight, and increases throughout the day.  He takes meds as rx'ed.   ?Past Medical History:  ?Diagnosis Date  ? ALLERGIC RHINITIS 03/15/2007  ? CRI (chronic renal insufficiency)   ? CRI, creat 1.4 , renal u/s 04-2011 neg  ? DIABETES MELLITUS, TYPE II 03/15/2007  ? Diplopia 12/24/2009  ? Elevated LFTs   ? ERECTILE DYSFUNCTION 03/15/2007  ? GOITER, MULTINODULAR 03/21/2010  ? HYPERLIPIDEMIA 03/15/2007  ? ? ?Past Surgical History:  ?Procedure Laterality Date  ? LASIK Left 10/2012  ? ? ?Social History  ? ?Socioeconomic History  ? Marital status: Divorced  ?  Spouse name: Not on file  ? Number of children: 1  ? Years of education: Not on file  ? Highest education level: Not on file  ?Occupational History  ? Occupation: active job Emergency planning/management officer)  ?  Comment: Maintenence  ? 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 Smoking status: Former  ?  Packs/day: 0.50  ?  Years: 10.00  ?  Pack years: 5.00  ?  Types: Cigarettes  ?  Quit date: 10/26/1993  ?  Years since quitting: 28.3  ? Smokeless tobacco: Never  ?Vaping Use  ? Vaping Use: Never used  ?Substance and Sexual Activity  ? Alcohol use: No  ?  Comment:    ? Drug use: No  ? Sexual activity: Not on file  ?Other  Topics Concern  ? Not on file  ?Social History Narrative  ? Divorced.   ? [place]: pt and daughter   ? ?Social Determinants of Health  ? ?Financial Resource Strain: Not on file  ?Food Insecurity: Not on file  ?Transportation Needs: Not on file  ?Physical Activity: Not on file  ?Stress: Not on file  ?Social Connections: Not on file  ?Intimate Partner Violence: Not on file  ? ? ?Current Outpatient Medications on File Prior to Visit  ?Medication Sig Dispense Refill  ? Azelastine HCl 137 MCG/SPRAY SOLN INHALE 2 SPRAYS INTO BOTH NOSTRIL AT BEDTIME AS NEEDED FOR RHINITIS OR ALLERGIES 30 mL 5  ? Blood Glucose Monitoring Suppl (ONETOUCH VERIO) w/Device KIT 1 each by Does not apply route 2 (two) times daily. E11.9 1 kit 0  ? Continuous Blood Gluc Sensor (DEXCOM G6 SENSOR) MISC 1 Device by Does not apply route See admin instructions. Change every 10 days 9 each 3  ? Dulaglutide (TRULICITY) 1.5 DJ/5.7SV SOPN Inject 1.5 mg into the skin once a week. 6 mL 3  ? ezetimibe (ZETIA) 10 MG tablet TAKE 1 TABLET BY MOUTH EVERY DAY 90 tablet 3  ? Fluticasone-Umeclidin-Vilant (TRELEGY ELLIPTA) 200-62.5-25 MCG/INH AEPB Inhale 1 puff into the lungs daily. 14 each 0  ?  Insulin Pen Needle (B-D UF III MINI PEN NEEDLES) 31G X 5 MM MISC USE AS DIRECTED EVERY DAY 100 each 3  ? Lancets (ONETOUCH DELICA PLUS [id number]) MISC TEST IN THE MORNING AND AT  BEDTIME 200 each 3  ? losartan (COZAAR) 50 MG tablet Take 1 tablet (50 mg total) by mouth daily. 90 tablet 1  ? ONETOUCH VERIO test strip TEST IN THE MORNING AND AT  BEDTIME 200 strip 3  ? OVER THE COUNTER MEDICATION Take 4 capsules by mouth daily. Nitric oxide supplement    ? OVER THE COUNTER MEDICATION Take 1 application by mouth daily as needed. Pre workout vitamin supplement mix    ? pravastatin (PRAVACHOL) 40 MG tablet TAKE 1 TABLET BY MOUTH EVERY DAY IN THE EVENING 30 tablet 2  ? Protein POWD Take 1 scoop by mouth daily as needed.    ? sildenafil (VIAGRA) 100 MG tablet Take 0.5-1 tablets  (50-100 mg total) by mouth daily as needed for erectile dysfunction. 6 tablet 19  ? ?No current facility-administered medications on file prior to visit.  ? ? ?No Known Allergies ? ?Family History  ?Problem Relation Age of Onset  ? Heart disease Mother   ?     CABG-onset in her early 37's  ? Diabetes Mother 47  ?     had DM from her 44's  ? Hypertension Mother   ? Prostate cancer Father 28  ?     dx in his 23s  ? Stroke Neg Hx   ? Colon cancer Neg Hx   ? ? ?BP (!) 142/90 (BP Location: Left Arm, Patient Position: Sitting, Cuff Size: Normal)   Pulse 94   Ht 5' 11"  (1.803 m)   Wt 211 lb 12.8 oz (96.1 kg)   SpO2 94%   BMI 29.54 kg/m?  ? ? ?Review of Systems ?Denies N/HB.   ?   ?Objective:  ? Physical Exam ?VITAL SIGNS:  See vs page.   ?GENERAL: no distress.   ? ? ? ?Lab Results  ?Component Value Date  ? HGBA1C 5.6 02/17/2022  ? ?   ?Assessment & Plan:  ?Insulin-requiring type 2 DM: overcontrolled.  He declines to increase Trulicity today.   ? ?Patient Instructions  ?Please continue the same Trulicity, and reduce the Lantus to 80 units each morning. ?check your blood sugar twice a day.  vary the time of day when you check, between before the 3 meals, and at bedtime.  also check if you have symptoms of your blood sugar being too high or too low.  please keep a record of the readings and bring it to your next appointment here (or you can bring the meter itself).  You can write it on any piece of paper.  please call us sooner if your blood sugar goes below 70, or if most of your readings are over 200.   ?You should have an endocrinology follow-up appointment in 3 months.    ? ? ? ?

## 2022-03-09 NOTE — Progress Notes (Signed)
? ? ?03/11/2022 ?[name] ?[id number] ?Jul 02, 1966 ? ?Referring provider: Colon Branch, MD ?Primary GI doctor: Dr. Loletha Carrow ? ?ASSESSMENT AND PLAN:  ? ?Abdominal pain, chronic, right upper quadrant ?10/21/2021 MR abdomen with and without contrast for indeterminant renal lesion shows lymphadenopathy in the abdomen and inferior mediastinum remained stable lymphoproliferative disorder cannot be excluded, no renal mass or hydronephrosis.  No hepatic masses gallbladder unremarkable no biliary duct dilatation, no masses in pancreas. ?Normal pancreas, no biliary ductal dilatation, normal gallbladder normal liver. ?Some lymphadenopathy in abdomen and known hilar lymphadenopathy. ?- I do not see where patient is ever seen hematology, question needs evaluation. ?-Pain does seem somewhat possibly radicular, some sensitivity with light touch, tingling sensation lidocaine does appear to help.  Discussed doing Voltaren/lidocaine patches.  Discussing with primary care could potentially do trial of an injection. ? ?Abdominal bloating ?-     CBC with Differential/Platelet; Future ?-     Comprehensive metabolic panel; Future ?-     H. pylori antibody, IgG; Future ?-     pantoprazole (PROTONIX) 40 MG tablet; Take 1 tablet (40 mg total) by mouth daily. ?Possible component of gastroparesis with Trulicity/diabetes history better controlled lately, worse when patient overeats but no nausea and vomiting.  Discussed gastroparesis diet.  We will check labs and do trial of Protonix. ?If not improving can consider endoscopic evaluation.  Unless Dr. Loletha Carrow feels this would be needed sooner. ? ?Elevated LFTs ?AST 72 ALT 79 ?Alkphos 143 TBili 0.7 ?Alcohol use 1-2 times a month, no tylenol. ?Had thorough work-up in the past with negative ANA, normal TIBC/ferritin, normal AMA, negative hepatitis panel, negative alpha-1 antitrypsin, ceruloplasmin negative ACE. ?Patient did have very mildly elevated mitochondrial antibody 20.6. ?I do not see where  IgG was done, please see if we can add on IGG, mitochonridal antibody and hepatitis panel, if not come back in for labs.  ?Recent MRCP showed no abnormal liver or biliary ducts. ? ?Chronic idiopathic constipation ?Possibly related to Trulicity use has been within the last year. ?We will add on MiraLAX, fiber. ?Not due for colonoscopy until 2025 ? ?Type 2 diabetes mellitus with stage 3a chronic kidney disease, with long-term current use of insulin ([place]) ?On trulicity ? ?Chronic kidney disease, unspecified CKD stage ?Follows with nephrology ? ?ILD (interstitial lung disease) ([place]) ?Follows with pulmonary  ? ?Non ischemic cardiomyopathy ?Follows with cardiology, normal stress test.  Last EF 35 to 40%. ? ?history of colonic polyps ?02/2014 colonoscopy with Dr. Deatra Ina polypoid lesion left colon benign lymphoid tissue due 2025 ? ? ?Patient Care Team: ?Colon Branch, MD as PCP - General ?Renato Shin, MD as Consulting Physician (Endocrinology) ?Reesa Chew, MD as Consulting Physician (Nephrology) ? ? ?History of Present Illness:  ?56 y.o. male  with a past medical history of hypertension, CKD stage 3, type 2 diabetes insulin dependent, hyperlipidemia, elevated liver function , cardiomyopathy (LVEF 35-40%) follows with Dr. Terri Skains and others listed below, returns to clinic today for evaluation of abdominal pain and liver enzymes. ? ?08/28/2019 office visit with Dr. Loletha Carrow for elevated liver function and abdominal bloating. ?02/2014 colonoscopy with Dr. Deatra Ina polypoid lesion left colon benign lymphoid tissue due 2025 ?10/2017 right upper quadrant ultrasound normal ?07/2017 ANA and AMA negative ?2016 alpha 1 antitripsyin negative, ceruloplasmin.  ?Fluctuating mild transaminase and alkalized phosphatase over the years.  Suspect fatty liver. ?05/2021 saw pulmonary for mediastinal adenopathy seen on CT, diagnosed with IDL.  ?10/21/2021 MR abdomen with and without contrast for indeterminant renal lesion  shows lymphadenopathy  in the abdomen and inferior mediastinum remained stable lymphoproliferative disorder cannot be excluded, no renal mass or hydronephrosis.  No hepatic masses gallbladder unremarkable no biliary duct dilatation, no masses in pancreas. ? ?He has been having AB bloating x 2020.  ?Patient states over the last year he has been having AB bloating after eating, with RUQ AB.  ?Was having tingling on upper AB diagnosed with nerve pain with PCP, given lidocaine patches that helps but still sensitive to the touch.  ?States will feel bloated and feel food "won't go down" if he over eats.  ?Very rare liquids, especially sugary drinks, can cause regurg of medications. No dysphagia with solids/pills.  ?Denies GERD/reflux. ?Patient has BM every other day, will occ have to take something to have BM. Hard stools at times, have to strain occ, some formed/soft stools.  ?Denies melena or hematochezia.  ?Has been on trulicity for 1 year.  ? ?CBC  10/10/2021  ?HGB 11.9 MCV 88.8 with normocytic anemia ?WBC 5.0 Platelets 197.0 ?Anemia panel 10/10/2021  ?Iron 125 Ferritin 533.3  ?Kidney function 12/22/2021  ?BUN 40 Cr 2.10  ?GFR 28  ?Potassium 4.2   ?LFTs 12/22/2021  ?AST 72 ALT 79 ?Alkphos 143 TBili 0.7 ?12/17/2020 TSH 1.03 ? ? ?Current Medications:  ? ?Current Outpatient Medications (Endocrine & Metabolic):  ?  Dulaglutide (TRULICITY) 1.5 JO/8.4ZY SOPN, Inject 1.5 mg into the skin once a week. ?  insulin glargine (LANTUS) 100 UNIT/ML Solostar Pen, Inject 80 Units into the skin every morning. And pen needles 1/day ? ?Current Outpatient Medications (Cardiovascular):  ?  ezetimibe (ZETIA) 10 MG tablet, TAKE 1 TABLET BY MOUTH EVERY DAY ?  losartan (COZAAR) 50 MG tablet, Take 1 tablet (50 mg total) by mouth daily. ?  pravastatin (PRAVACHOL) 40 MG tablet, TAKE 1 TABLET BY MOUTH EVERY DAY IN THE EVENING ?  sildenafil (VIAGRA) 100 MG tablet, Take 0.5-1 tablets (50-100 mg total) by mouth daily as needed for erectile dysfunction. ? ?Current Outpatient  Medications (Respiratory):  ?  Azelastine HCl 137 MCG/SPRAY SOLN, INHALE 2 SPRAYS INTO BOTH NOSTRIL AT BEDTIME AS NEEDED FOR RHINITIS OR ALLERGIES ?  Fluticasone-Umeclidin-Vilant (TRELEGY ELLIPTA) 200-62.5-25 MCG/INH AEPB, Inhale 1 puff into the lungs daily. ? ? ? ?Current Outpatient Medications (Other):  ?  [place] Oil-Levomenthol (FDGARD) 25-20.75 MG CAPS, Take as directed ?  Continuous Blood Gluc Sensor (DEXCOM G6 SENSOR) MISC, 1 Device by Does not apply route See admin instructions. Change every 10 days ?  Insulin Pen Needle (B-D UF III MINI PEN NEEDLES) 31G X 5 MM MISC, USE AS DIRECTED EVERY DAY ?  Lancets (ONETOUCH DELICA PLUS SAYTKZ60F) MISC, TEST IN THE MORNING AND AT  BEDTIME ?  ONETOUCH VERIO test strip, TEST IN THE MORNING AND AT  BEDTIME ?  OVER THE COUNTER MEDICATION, Take 4 capsules by mouth daily. Nitric oxide supplement ?  OVER THE COUNTER MEDICATION, Take 1 application by mouth daily as needed. Pre workout vitamin supplement mix ?  pantoprazole (PROTONIX) 40 MG tablet, Take 1 tablet (40 mg total) by mouth daily. ?  polyethylene glycol (MIRALAX) 17 g packet, Take 17 g by mouth daily. ?  Protein POWD, Take 1 scoop by mouth daily as needed. ?  Blood Glucose Monitoring Suppl (ONETOUCH VERIO) w/Device KIT, 1 each by Does not apply route 2 (two) times daily. E11.9 (Patient not taking: Reported on 03/11/2022) ? ?Surgical History:  ?He  has a past surgical history that includes LASIK (Left, 10/2012). ?Family History:  ?His  family history includes Diabetes (age of onset: 9) in his mother; Heart disease in his mother; Hypertension in his mother; Prostate cancer (age of onset: 9) in his father. ?Social History:  ? reports that he quit smoking about 28 years ago. His smoking use included cigarettes. He has a 5.00 pack-year smoking history. He has never used smokeless tobacco. He reports that he does not drink alcohol and does not use drugs. ? ?Current Medications, Allergies, Past Medical History, Past Surgical  History, Family History and Social History were reviewed in [employer] record. ? ?Physical Exam: ?BP 138/80   Pulse 74   Ht 5' 9"  (1.753 m)   Wt 210 lb 12.8 oz (95.6 kg)   BMI 31

## 2022-03-11 ENCOUNTER — Telehealth: Payer: Self-pay

## 2022-03-11 ENCOUNTER — Ambulatory Visit (INDEPENDENT_AMBULATORY_CARE_PROVIDER_SITE_OTHER): Payer: 59 | Admitting: Physician Assistant

## 2022-03-11 ENCOUNTER — Encounter: Payer: Self-pay | Admitting: Physician Assistant

## 2022-03-11 ENCOUNTER — Other Ambulatory Visit (INDEPENDENT_AMBULATORY_CARE_PROVIDER_SITE_OTHER): Payer: 59

## 2022-03-11 ENCOUNTER — Other Ambulatory Visit: Payer: Self-pay

## 2022-03-11 VITALS — BP 138/80 | HR 74 | Ht 69.0 in | Wt 210.8 lb

## 2022-03-11 DIAGNOSIS — N189 Chronic kidney disease, unspecified: Secondary | ICD-10-CM

## 2022-03-11 DIAGNOSIS — J849 Interstitial pulmonary disease, unspecified: Secondary | ICD-10-CM

## 2022-03-11 DIAGNOSIS — Z860100 Personal history of colon polyps, unspecified: Secondary | ICD-10-CM

## 2022-03-11 DIAGNOSIS — R140 Abdominal distension (gaseous): Secondary | ICD-10-CM

## 2022-03-11 DIAGNOSIS — G8929 Other chronic pain: Secondary | ICD-10-CM

## 2022-03-11 DIAGNOSIS — R7989 Other specified abnormal findings of blood chemistry: Secondary | ICD-10-CM

## 2022-03-11 DIAGNOSIS — Z794 Long term (current) use of insulin: Secondary | ICD-10-CM

## 2022-03-11 DIAGNOSIS — E1122 Type 2 diabetes mellitus with diabetic chronic kidney disease: Secondary | ICD-10-CM

## 2022-03-11 DIAGNOSIS — D649 Anemia, unspecified: Secondary | ICD-10-CM

## 2022-03-11 DIAGNOSIS — R1011 Right upper quadrant pain: Secondary | ICD-10-CM

## 2022-03-11 DIAGNOSIS — K5904 Chronic idiopathic constipation: Secondary | ICD-10-CM | POA: Diagnosis not present

## 2022-03-11 DIAGNOSIS — N1831 Chronic kidney disease, stage 3a: Secondary | ICD-10-CM

## 2022-03-11 LAB — CBC WITH DIFFERENTIAL/PLATELET
Basophils Absolute: 0 10*3/uL (ref 0.0–0.1)
Basophils Relative: 1 % (ref 0.0–3.0)
Eosinophils Absolute: 0.2 10*3/uL (ref 0.0–0.7)
Eosinophils Relative: 5.6 % — ABNORMAL HIGH (ref 0.0–5.0)
HCT: 32.6 % — ABNORMAL LOW (ref 39.0–52.0)
Hemoglobin: 11.4 g/dL — ABNORMAL LOW (ref 13.0–17.0)
Lymphocytes Relative: 16.3 % (ref 12.0–46.0)
Lymphs Abs: 0.7 10*3/uL (ref 0.7–4.0)
MCHC: 34.9 g/dL (ref 30.0–36.0)
MCV: 87.3 fl (ref 78.0–100.0)
Monocytes Absolute: 0.7 10*3/uL (ref 0.1–1.0)
Monocytes Relative: 18 % — ABNORMAL HIGH (ref 3.0–12.0)
Neutro Abs: 2.4 10*3/uL (ref 1.4–7.7)
Neutrophils Relative %: 59.1 % (ref 43.0–77.0)
Platelets: 178 10*3/uL (ref 150.0–400.0)
RBC: 3.73 Mil/uL — ABNORMAL LOW (ref 4.22–5.81)
RDW: 12.9 % (ref 11.5–15.5)
WBC: 4.1 10*3/uL (ref 4.0–10.5)

## 2022-03-11 LAB — COMPREHENSIVE METABOLIC PANEL
ALT: 64 U/L — ABNORMAL HIGH (ref 0–53)
AST: 52 U/L — ABNORMAL HIGH (ref 0–37)
Albumin: 4.2 g/dL (ref 3.5–5.2)
Alkaline Phosphatase: 115 U/L (ref 39–117)
BUN: 36 mg/dL — ABNORMAL HIGH (ref 6–23)
CO2: 29 mEq/L (ref 19–32)
Calcium: 9.5 mg/dL (ref 8.4–10.5)
Chloride: 105 mEq/L (ref 96–112)
Creatinine, Ser: 1.87 mg/dL — ABNORMAL HIGH (ref 0.40–1.50)
GFR: 39.27 mL/min — ABNORMAL LOW (ref >60.00)
Glucose, Bld: 111 mg/dL — ABNORMAL HIGH (ref 70–99)
Potassium: 3.7 mEq/L (ref 3.5–5.1)
Sodium: 142 mEq/L (ref 135–145)
Total Bilirubin: 0.7 mg/dL (ref 0.2–1.2)
Total Protein: 7.9 g/dL (ref 6.0–8.3)

## 2022-03-11 LAB — H. PYLORI ANTIBODY, IGG: H Pylori IgG: NEGATIVE

## 2022-03-11 MED ORDER — PANTOPRAZOLE SODIUM 40 MG PO TBEC: 40.0000 mg | DELAYED_RELEASE_TABLET | Freq: Every day | ORAL | 3 refills | Status: DC

## 2022-03-11 MED ORDER — FDGARD 25-20.75 MG PO CAPS: ORAL_CAPSULE | ORAL | Status: DC

## 2022-03-11 MED ORDER — POLYETHYLENE GLYCOL 3350 17 G PO PACK: 17.0000 g | PACK | Freq: Every day | ORAL | 0 refills | Status: AC

## 2022-03-11 NOTE — Patient Instructions (Addendum)
If you are age 58 or older, your body mass index should be between 23-30. Your Body mass index is 31.13 kg/m?Marland Kitchen If this is out of the aforementioned range listed, please consider follow up with your Primary Care Provider. ? ?If you are age 37 or younger, your body mass index should be between 19-25. Your Body mass index is 31.13 kg/m?Marland Kitchen If this is out of the aformentioned range listed, please consider follow up with your Primary Care Provider.  ? ?________________________________________________________ ? ?The [place] [facility] providers would like to encourage you to use [facility] to communicate with providers for non-urgent requests or questions.  Due to long hold times on the telephone, sending your provider a message by [facility] may be a faster and more efficient way to get a response.  Please allow 48 business hours for a response.  Please remember that this is for non-urgent requests.  ?_______________________________________________________ ? ?Please go to the lab in the basement of our building to have lab work done as you leave today. Hit "B" for basement when you get on the elevator.  When the doors open the lab is on your left.  We will call you with the results. Thank you. ? ? ?No aleve, ibuprofen, goody powders, as these are antiinflammatories and can cause inflammation in your stomach, increase bleeding risk and cause ulcers.  ?You can talk with PCP about alternative pain options.  ?Can do tyelnol max 3000 mg a day, salon pas patches /LIDOCAINE are over the counter and voltern gel is topical antiinflammatory that is safe.  ? ?Please take your proton pump inhibitor medication 30 minutes to 1 hour before meals- this makes it more effective. TAKE FOR ONE MONTH.  ?Avoid spicy and acidic foods ?Avoid fatty foods ?Limit your intake of coffee, tea, alcohol, and carbonated drinks ?Work to maintain a healthy weight ?Keep the head of the bed elevated at least 3 inches with blocks or a wedge pillow if you are having any  nighttime symptoms ?Stay upright for 2 hours after eating ?Avoid meals and snacks three to four hours before bedtime. ? ?TRULICITY CAN CAUSE SLOWING OF STOMACH AND YOUR BOWEL MOVEMENTS ?CAN GET ON MIRALAX ONCE A DAY AND ADD A FIBER.  ?ADD ON FDGARD OVER THE COUNTER - we have given you samples today to try. ?TAKE PANTOPRAZOLE ONCE A DAY FOR 30 DAYS. - we sent a prescription to your pharmacy. ? ?FOLLOW UP 4-6 WEEKS.  We have scheduled you for a follow up on Wednesday, 04-22-22 at 9:00am ? ?Miralax is an osmotic laxative.  ?It only brings more water into the stool.  ?This is safe to take daily.  ?Can take up to 17 gram of miralax twice a day.  ?Mix with juice or coffee.  ?Start 1 capful at night for 3-4 days and reassess your response in 3-4 days.  ?You can increase and decrease the dose based on your response.  ?Remember, it can take up to 3-4 days to take effect OR for the effects to wear off.  ? ?I often pair this with benefiber in the morning to help assure the stool is not too loose.  ? ?- Drink at least 64-80 ounces of water/liquid per day. ?- Establish a time to try to move your bowels every day.  For many people, this is after a cup of coffee or after a meal such as breakfast. ?- Sit all of the way back on the toilet keeping your back fairly straight and while sitting up, try  to rest the tops of your forearms on your upper thighs.   ?- Raising your feet with a step stool/squatty potty can be helpful to improve the angle that allows your stool to pass through the rectum. ?- Relax the rectum feeling it bulge toward the toilet water.  If you feel your rectum raising toward your body, you are contracting rather than relaxing. ?- Breathe in and slowly exhale. "Belly breath" by expanding your belly towards your belly button. Keep belly expanded as you gently direct pressure down and back to the anus.  A low pitched GRRR sound can assist with increasing intra-abdominal pressure.  ?- Repeat 3-4 times. If unsuccessful,  contract the pelvic floor to restore normal tone and get off the toilet.  Avoid excessive straining. ?- To reduce excessive wiping by teaching your anus to normally contract, place hands on outer aspect of knees and resist knee movement outward.  Hold 5-10 second then place hands just inside of knees and resist inward movement of knees.  Hold 5 seconds.  Repeat a few times each way. ? ?Go to the ER if unable to pass gas, severe AB pain, unable to hold down food, any shortness of breath of chest pain.  ? ?Constipation, Adult ?Constipation is when a person has fewer than three bowel movements in a week, has difficulty having a bowel movement, or has stools (feces) that are dry, hard, or larger than normal. Constipation may be caused by an underlying condition. It may become worse with age if a person takes certain medicines and does not take in enough fluids. ?Follow these instructions at home: ?Eating and drinking ? ?Eat foods that have a lot of fiber, such as beans, whole grains, and fresh fruits and vegetables. ?Limit foods that are low in fiber and high in fat and processed sugars, such as fried or sweet foods. These include french fries, hamburgers, cookies, candies, and soda. ?Drink enough fluid to keep your urine pale yellow. ?General instructions ?Exercise regularly or as told by your health care provider. Try to do 150 minutes of moderate exercise each week. ?Use the bathroom when you have the urge to go. Do not hold it in. ?Take over-the-counter and prescription medicines only as told by your health care provider. This includes any fiber supplements. ?During bowel movements: ?Practice deep breathing while relaxing the lower abdomen. ?Practice pelvic floor relaxation. ?Watch your condition for any changes. Let your health care provider know about them. ?Keep all follow-up visits as told by your health care provider. This is important. ?Contact a health care provider if: ?You have pain that gets worse. ?You  have a fever. ?You do not have a bowel movement after 4 days. ?You vomit. ?You are not hungry or you lose weight. ?You are bleeding from the opening between the buttocks (anus). ?You have thin, pencil-like stools. ?Get help right away if: ?You have a fever and your symptoms suddenly get worse. ?You leak stool or have blood in your stool. ?Your abdomen is bloated. ?You have severe pain in your abdomen. ?You feel dizzy or you faint. ?Summary ?Constipation is when a person has fewer than three bowel movements in a week, has difficulty having a bowel movement, or has stools (feces) that are dry, hard, or larger than normal. ?Eat foods that have a lot of fiber, such as beans, whole grains, and fresh fruits and vegetables. ?Drink enough fluid to keep your urine pale yellow. ?Take over-the-counter and prescription medicines only as told by your health care  provider. This includes any fiber supplements. ?This information is not intended to replace advice given to you by your health care provider. Make sure you discuss any questions you have with your health care provider. ?Document Revised: 08/30/2019 Document Reviewed: 08/30/2019 ?Elsevier Patient Education ? [place]. ? ? ?Thank you for entrusting me with your care and for choosing [facility], ?Vicie Mutters, P.A.-C ? ? ?

## 2022-03-11 NOTE — Telephone Encounter (Signed)
Add on lab request sheet has been faxed to lab.  ?

## 2022-03-11 NOTE — Telephone Encounter (Signed)
-----   Message from Vladimir Crofts, [place] sent at 03/11/2022 12:11 PM EDT ----- ?Regarding: Please ?I do not see where IgG was done, please see if we can add on IGG, mitochonridal antibody and hepatitis panel, if not come back in for labs or get it done at follow up.  ?Thanks! ? ?

## 2022-03-11 NOTE — Telephone Encounter (Signed)
Per the lab, labs unable to be added on. Per Vicie Mutters we can wait until patient's f/u appointment next month for patient to have additional labs done.  Appointment notes updated ?

## 2022-03-12 NOTE — Progress Notes (Signed)
____________________________________________________________  Attending physician addendum:  Thank you for sending this case to me. I have reviewed the entire note as well as previous lab and imaging reports.  I still believe his mildly elevated LFTs are fatty liver with most likely some secondary iron overload rather than hemochromatosis.  (Refer to previous work-up as well regarding this.)  His chronic upper abdominal bloating is benign and functional in nature, perhaps worsened by side effect of Trulicity as you mentioned.  I would not recommend any particular prescription medicine for it, dietary changes may be helpful.  Regarding the mediastinal and upper abdominal adenopathy seen on imaging, I cannot determine the cause of this and it does not appear to be from a primary digestive condition.  Please copy this note to his primary care provider as well as his pulmonary and renal consultants, and I would leave it to the discretion of the ordering provider how they wish to follow that or obtain other specialist consultation if needed.  Wilfrid Lund, MD  ____________________________________________________________

## 2022-03-24 ENCOUNTER — Other Ambulatory Visit: Payer: Self-pay

## 2022-03-24 DIAGNOSIS — N183 Chronic kidney disease, stage 3 unspecified: Secondary | ICD-10-CM

## 2022-03-24 MED ORDER — BD PEN NEEDLE MINI U/F 31G X 5 MM MISC: 3 refills | Status: DC

## 2022-04-09 NOTE — Progress Notes (Signed)
04/22/2022 Wayne Webb [id number] [birth year]  Referring provider: Colon Branch, MD Primary GI doctor: Dr. Loletha Carrow  ASSESSMENT AND PLAN:   Abdominal pain, chronic, right upper quadrant with AB bloating MRI AB Normal pancreas, no biliary ductal dilatation, normal gallbladder normal liver.Some lymphadenopathy in abdomen and known hilar lymphadenopathy. -Pain does seem somewhat possibly radicular, some sensitivity with light touch, tingling sensation lidocaine does appear to help.   Discussing with primary care could potentially do trial of an injection for pain  Abdominal bloating Possible component of gastroparesis with Trulicity/diabetes  Discussed gastroparesis diet.   ? More muscular, told to follow up with PCP  Elevated LFTs Alcohol use 1-2 times a month, no tylenol. Had thorough work-up in the past with negative ANA, normal TIBC/ferritin, normal AMA, negative hepatitis panel, negative alpha-1 antitrypsin, ceruloplasmin negative ACE.  Recent MRCP showed no abnormal liver or biliary ducts. Will reevaluate some labs on the patient for complete evaluation however most likely fatty liver.   Chronic idiopathic constipation Likely related to Trulicity use/ polypharmacy, has been within the last year. We will add on MiraLAX, fiber. Not due for colonoscopy until 2025  Type 2 diabetes mellitus with stage 3a chronic kidney disease, with long-term current use of insulin (HCC) On trulicity likely contributing to his symptoms  Chronic kidney disease, unspecified CKD stage Follows with nephrology  ILD (interstitial lung disease) ([place]) Follows with pulmonary   Non ischemic cardiomyopathy Follows with cardiology, normal stress test.  Last EF 35 to 40%.  history of colonic polyps 02/2014 colonoscopy with Dr. Deatra Ina polypoid lesion left colon benign lymphoid tissue due 2025   Patient Care Team: Colon Branch, MD as PCP - General Renato Shin, MD (Inactive) as Consulting  Physician (Endocrinology) Reesa Chew, MD as Consulting Physician (Nephrology)   History of Present Illness:  56 y.o. male  with a past medical history of hypertension, CKD stage 3, type 2 diabetes insulin dependent, hyperlipidemia, elevated liver function , cardiomyopathy (LVEF 35-40%) follows with Dr. Terri Skains and others listed below, returns to clinic today for evaluation of abdominal pain and liver enzymes.  02/2014 colonoscopy with Dr. Deatra Ina polypoid lesion left colon benign lymphoid tissue due 2025 10/2017 right upper quadrant ultrasound normal 07/2017 ANA and AMA negative 2016 alpha 1 antitripsyin negative, ceruloplasmin.  Fluctuating mild transaminase and alkalized phosphatase over the years.  Suspect fatty liver. Work up in 2016 unremarkable other than possible secondary iron overload.Marland Kitchen   05/2021 saw pulmonary for mediastinal adenopathy seen on CT, diagnosed with IDL.  10/21/2021 MR abdomen with and without contrast for indeterminant renal lesion shows lymphadenopathy in the abdomen and inferior mediastinum remained stable lymphoproliferative disorder cannot be excluded, no renal mass or hydronephrosis.  No hepatic masses gallbladder unremarkable no biliary duct dilatation, no masses in pancreas.  He has been having AB bloating x 2020.  Patient is on Trulicity, given information about diet. He has been off of it for 3 weeks, some improvement of symptoms. Has been on protonix.  Labs from last visit showed hemoglobin 11.4, MCV 87.3-we will recheck iron/ferritin.  But likely from CKD/chronic disease. Negative H. pylori, normal liver and electrolytes stable kidney function. Patient with abdominal lymphadenopathy monocytosis since 2012 suggested referral to hematology for evaluation. On miralax and fiber and having more stools, doing better, no straining.  Denies melena or hematochezia.  States the "bloating he is feeling" is worse when he is standing, better with lying down and the  skin on his right side feels odd.  Lidocaine patches helped some.   Current Medications:   Current Outpatient Medications (Endocrine & Metabolic):    Dulaglutide (TRULICITY) 1.5 AX/6.5VV SOPN, Inject 1.5 mg into the skin once a week.   insulin glargine (LANTUS) 100 UNIT/ML Solostar Pen, Inject 80 Units into the skin every morning. And pen needles 1/day  Current Outpatient Medications (Cardiovascular):    ezetimibe (ZETIA) 10 MG tablet, TAKE 1 TABLET BY MOUTH EVERY DAY   losartan (COZAAR) 50 MG tablet, Take 1 tablet (50 mg total) by mouth daily.   pravastatin (PRAVACHOL) 40 MG tablet, TAKE 1 TABLET BY MOUTH EVERY DAY IN THE EVENING   sildenafil (VIAGRA) 100 MG tablet, Take 0.5-1 tablets (50-100 mg total) by mouth daily as needed for erectile dysfunction.  Current Outpatient Medications (Respiratory):    Azelastine HCl 137 MCG/SPRAY SOLN, INHALE 2 SPRAYS INTO BOTH NOSTRIL AT BEDTIME AS NEEDED FOR RHINITIS OR ALLERGIES   Fluticasone-Umeclidin-Vilant (TRELEGY ELLIPTA) 200-62.5-25 MCG/INH AEPB, Inhale 1 puff into the lungs daily.    Current Outpatient Medications (Other):    Blood Glucose Monitoring Suppl (ONETOUCH VERIO) w/Device KIT, 1 each by Does not apply route 2 (two) times daily. E11.9   [place] Oil-Levomenthol (FDGARD) 25-20.75 MG CAPS, Take as directed   Continuous Blood Gluc Sensor (DEXCOM G6 SENSOR) MISC, 1 Device by Does not apply route See admin instructions. Change every 10 days   Insulin Pen Needle (B-D UF III MINI PEN NEEDLES) 31G X 5 MM MISC, USE AS DIRECTED EVERY DAY   Lancets (ONETOUCH DELICA PLUS ZSMOLM78M) MISC, TEST IN THE MORNING AND AT  BEDTIME   ONETOUCH VERIO test strip, TEST IN THE MORNING AND AT  BEDTIME   OVER THE COUNTER MEDICATION, Take 4 capsules by mouth daily. Nitric oxide supplement   OVER THE COUNTER MEDICATION, Take 1 application by mouth daily as needed. Pre workout vitamin supplement mix   pantoprazole (PROTONIX) 40 MG tablet, Take 1 tablet (40 mg total)  by mouth daily.   polyethylene glycol (MIRALAX) 17 g packet, Take 17 g by mouth daily.   Protein POWD, Take 1 scoop by mouth daily as needed.  Surgical History:  He  has a past surgical history that includes LASIK (Left, 10/2012). Family History:  His family history includes Diabetes (age of onset: 62) in his mother; Heart disease in his mother; Hypertension in his mother; Prostate cancer (age of onset: 58) in his father. Social History:   reports that he quit smoking about 28 years ago. His smoking use included cigarettes. He has a 5.00 pack-year smoking history. He has never used smokeless tobacco. He reports that he does not drink alcohol and does not use drugs.  Current Medications, Allergies, Past Medical History, Past Surgical History, Family History and Social History were reviewed in [employer] record.  Physical Exam: BP 100/60   Pulse 99   Ht 5' 11"  (1.803 m)   Wt 208 lb (94.3 kg)   BMI 29.01 kg/m  General:   Pleasant, well developed male in no acute distress Heart : Regular rate and rhythm; no murmurs Pulm: Clear anteriorly; no wheezing Abdomen:  Soft, Non-distended AB, Sluggish bowel sounds. mild tenderness in the RUQ but he also has tenderness with AB engaged and just with touching the skinWithout guarding and Without rebound, No organomegaly appreciated. Rectal: Not evaluated Extremities:  without  edema. Neurologic:  Alert and  oriented x4;  No focal deficits.  Psych:  Cooperative. Normal mood and affect.   Vladimir Crofts, PA-C 04/22/22

## 2022-04-13 ENCOUNTER — Other Ambulatory Visit: Payer: Self-pay | Admitting: Cardiology

## 2022-04-13 DIAGNOSIS — Z8249 Family history of ischemic heart disease and other diseases of the circulatory system: Secondary | ICD-10-CM

## 2022-04-13 DIAGNOSIS — E1122 Type 2 diabetes mellitus with diabetic chronic kidney disease: Secondary | ICD-10-CM

## 2022-04-13 DIAGNOSIS — E1169 Type 2 diabetes mellitus with other specified complication: Secondary | ICD-10-CM

## 2022-04-22 ENCOUNTER — Other Ambulatory Visit (INDEPENDENT_AMBULATORY_CARE_PROVIDER_SITE_OTHER): Payer: 59

## 2022-04-22 ENCOUNTER — Ambulatory Visit (INDEPENDENT_AMBULATORY_CARE_PROVIDER_SITE_OTHER): Payer: 59 | Admitting: Physician Assistant

## 2022-04-22 ENCOUNTER — Encounter: Payer: Self-pay | Admitting: Physician Assistant

## 2022-04-22 ENCOUNTER — Other Ambulatory Visit: Payer: Self-pay

## 2022-04-22 VITALS — BP 100/60 | HR 99 | Ht 71.0 in | Wt 208.0 lb

## 2022-04-22 DIAGNOSIS — Z86010 Personal history of colonic polyps: Secondary | ICD-10-CM

## 2022-04-22 DIAGNOSIS — N1831 Chronic kidney disease, stage 3a: Secondary | ICD-10-CM

## 2022-04-22 DIAGNOSIS — R140 Abdominal distension (gaseous): Secondary | ICD-10-CM | POA: Diagnosis not present

## 2022-04-22 DIAGNOSIS — K5904 Chronic idiopathic constipation: Secondary | ICD-10-CM | POA: Diagnosis not present

## 2022-04-22 DIAGNOSIS — Z794 Long term (current) use of insulin: Secondary | ICD-10-CM

## 2022-04-22 DIAGNOSIS — R7989 Other specified abnormal findings of blood chemistry: Secondary | ICD-10-CM

## 2022-04-22 DIAGNOSIS — R1011 Right upper quadrant pain: Secondary | ICD-10-CM | POA: Diagnosis not present

## 2022-04-22 DIAGNOSIS — J849 Interstitial pulmonary disease, unspecified: Secondary | ICD-10-CM

## 2022-04-22 DIAGNOSIS — G8929 Other chronic pain: Secondary | ICD-10-CM

## 2022-04-22 DIAGNOSIS — N189 Chronic kidney disease, unspecified: Secondary | ICD-10-CM

## 2022-04-22 DIAGNOSIS — E1122 Type 2 diabetes mellitus with diabetic chronic kidney disease: Secondary | ICD-10-CM

## 2022-04-22 DIAGNOSIS — D649 Anemia, unspecified: Secondary | ICD-10-CM

## 2022-04-22 LAB — FERRITIN: Ferritin: 597.3 ng/mL — ABNORMAL HIGH (ref 22.0–322.0)

## 2022-04-22 MED ORDER — TRULICITY 1.5 MG/0.5ML ~~LOC~~ SOAJ: 1.5000 mg | SUBCUTANEOUS | 3 refills | Status: DC

## 2022-04-22 NOTE — Patient Instructions (Addendum)
Your provider has requested that you go to the basement level for lab work before leaving today. Press "B" on the elevator. The lab is located at the first door on the left as you exit the elevator.   Please follow up with your primary care, I think your pain/bloating you are feeling may be more nerve or muscle related.   Please take your proton pump inhibitor medication for another month,The pantoprazole can then take AS needed, if gets worse or have issues with trouble swallowing, call us.   Please take this medication 30 minutes to 1 hour before meals- this makes it more effective.  Avoid spicy and acidic foods Avoid fatty foods Limit your intake of coffee, tea, alcohol, and carbonated drinks Work to maintain a healthy weight Keep the head of the bed elevated at least 3 inches with blocks or a wedge pillow if you are having any nighttime symptoms Stay upright for 2 hours after eating Avoid meals and snacks three to four hours before bedtime  Remember trulicity can cause constipation and can worsen GERD Recommend starting on a fiber supplement, can try metamucil first but if this causes gas/bloating switch to benefiber or citracel, these do not cause gas.  Take with fiber with with a full 8 oz glass of water once a day. This can take 1 month to start helping, so try for at least one month.  Recommend increasing water and physical activity.  Get a squatty potty to use at home or try a stool, goal is to get your knees above your hips during a bowel movement.  - Drink at least 64-80 ounces of water/liquid per day. - Establish a time to try to move your bowels every day.  For many people, this is after a cup of coffee or after a meal such as breakfast. - Sit all of the way back on the toilet keeping your back fairly straight and while sitting up, try to rest the tops of your forearms on your upper thighs.   - Raising your feet with a step stool/squatty potty can be helpful to improve the angle  that allows your stool to pass through the rectum. - Relax the rectum feeling it bulge toward the toilet water.  If you feel your rectum raising toward your body, you are contracting rather than relaxing. - Breathe in and slowly exhale. "Belly breath" by expanding your belly towards your belly button. Keep belly expanded as you gently direct pressure down and back to the anus.  A low pitched GRRR sound can assist with increasing intra-abdominal pressure.  - Repeat 3-4 times. If unsuccessful, contract the pelvic floor to restore normal tone and get off the toilet.  Avoid excessive straining. - To reduce excessive wiping by teaching your anus to normally contract, place hands on outer aspect of knees and resist knee movement outward.  Hold 5-10 second then place hands just inside of knees and resist inward movement of knees.  Hold 5 seconds.  Repeat a few times each way.  Go to the ER if unable to pass gas, severe AB pain, unable to hold down food, any shortness of breath of chest pain.

## 2022-04-23 LAB — IRON AND TIBC
Iron Saturation: 38 % (ref 15–55)
Iron: 121 ug/dL (ref 38–169)
Total Iron Binding Capacity: 319 ug/dL (ref 250–450)
UIBC: 198 ug/dL (ref 111–343)

## 2022-04-27 LAB — HEPATITIS C ANTIBODY: Hepatitis C Ab: NONREACTIVE

## 2022-04-27 LAB — HEPATITIS B CORE ANTIBODY, IGM: Hep B C IgM: NONREACTIVE

## 2022-04-27 LAB — HEPATITIS B SURFACE ANTIBODY,QUALITATIVE: Hep B S Ab: NONREACTIVE

## 2022-04-27 LAB — HEPATITIS A ANTIBODY, TOTAL: Hepatitis A AB,Total: NONREACTIVE

## 2022-04-27 LAB — MITOCHONDRIAL ANTIBODIES: Mitochondrial M2 Ab, IgG: <=20 U (ref <=20.0)

## 2022-04-27 LAB — HEPATITIS B SURFACE ANTIGEN: Hepatitis B Surface Ag: NONREACTIVE

## 2022-04-27 LAB — IGG: IgG (Immunoglobin G), Serum: 1997 mg/dL — ABNORMAL HIGH (ref 600–1640)

## 2022-04-29 NOTE — Progress Notes (Signed)
____________________________________________________________  Attending physician addendum:  Thank you for sending this case to me. I have reviewed the entire note and agree with the plan.  While bothersome to him, extensive work-up over the years indicates this bloating is benign not clearly from chronic digestive disorder. I also believe his elevated liver labs are from fatty liver.  Wilfrid Lund, MD  ____________________________________________________________

## 2022-05-14 ENCOUNTER — Other Ambulatory Visit: Payer: Self-pay | Admitting: Endocrinology

## 2022-05-14 ENCOUNTER — Other Ambulatory Visit: Payer: 59

## 2022-05-14 DIAGNOSIS — Z794 Long term (current) use of insulin: Secondary | ICD-10-CM

## 2022-05-14 DIAGNOSIS — N1831 Chronic kidney disease, stage 3a: Secondary | ICD-10-CM

## 2022-05-15 ENCOUNTER — Other Ambulatory Visit (INDEPENDENT_AMBULATORY_CARE_PROVIDER_SITE_OTHER): Payer: 59

## 2022-05-15 DIAGNOSIS — Z794 Long term (current) use of insulin: Secondary | ICD-10-CM | POA: Diagnosis not present

## 2022-05-15 DIAGNOSIS — N1831 Chronic kidney disease, stage 3a: Secondary | ICD-10-CM | POA: Diagnosis not present

## 2022-05-15 DIAGNOSIS — E1122 Type 2 diabetes mellitus with diabetic chronic kidney disease: Secondary | ICD-10-CM

## 2022-05-15 LAB — HEMOGLOBIN A1C: Hgb A1c MFr Bld: 6 % (ref 4.6–6.5)

## 2022-05-15 LAB — BASIC METABOLIC PANEL
BUN: 41 mg/dL — ABNORMAL HIGH (ref 6–23)
CO2: 30 mEq/L (ref 19–32)
Calcium: 10.3 mg/dL (ref 8.4–10.5)
Chloride: 102 mEq/L (ref 96–112)
Creatinine, Ser: 2.05 mg/dL — ABNORMAL HIGH (ref 0.40–1.50)
GFR: 35.13 mL/min — ABNORMAL LOW (ref >60.00)
Glucose, Bld: 97 mg/dL (ref 70–99)
Potassium: 4.2 mEq/L (ref 3.5–5.1)
Sodium: 141 mEq/L (ref 135–145)

## 2022-05-16 LAB — FRUCTOSAMINE: Fructosamine: 316 umol/L — ABNORMAL HIGH (ref 0–285)

## 2022-05-19 ENCOUNTER — Ambulatory Visit (INDEPENDENT_AMBULATORY_CARE_PROVIDER_SITE_OTHER): Payer: 59 | Admitting: Endocrinology

## 2022-05-19 ENCOUNTER — Encounter: Payer: Self-pay | Admitting: Endocrinology

## 2022-05-19 VITALS — BP 112/78 | HR 91 | Ht 72.0 in | Wt 211.2 lb

## 2022-05-19 DIAGNOSIS — E1122 Type 2 diabetes mellitus with diabetic chronic kidney disease: Secondary | ICD-10-CM

## 2022-05-19 DIAGNOSIS — I10 Essential (primary) hypertension: Secondary | ICD-10-CM | POA: Diagnosis not present

## 2022-05-19 DIAGNOSIS — Z794 Long term (current) use of insulin: Secondary | ICD-10-CM

## 2022-05-19 DIAGNOSIS — N1831 Chronic kidney disease, stage 3a: Secondary | ICD-10-CM | POA: Diagnosis not present

## 2022-05-19 NOTE — Patient Instructions (Addendum)
Take 70 Lantus at 6 pm   Message before Trulicity out to go up to '3mg'$ 

## 2022-05-19 NOTE — Progress Notes (Signed)
Patient ID: Wayne Webb, male   DOB: Sep 26, 1966, 56 y.o.   MRN: [id number]           Reason for Appointment: Type II Diabetes follow-up   History of Present Illness   Diagnosis date: 2004  Previous history:  Non-insulin hypoglycemic drugs previously used: Trulicity since 7/26 Insulin was started in 2011, has been on either Lantus or Tresiba, also tried on Humalog mix Trulicity was increased to 1.5 mg in 1/23  A1c range in the last few years is: 5.2-12.3  Recent history:     Non-insulin hypoglycemic drugs: Trulicity 1.5 mg weekly, Jardiance unknown dose     Insulin regimen: Lantus 80 units in a.m.           Side effects from medications: None  Current self management, blood sugar patterns and problems identified:  A1c is 6 and about the same He works night shifts and takes Lantus in the morning Appears to be having tendency to low normal or low sugars during the daytime especially before dinnertime but occasionally in the mornings also such as today Although he is trying to do well with his diet he still has periodic hypoglycemic episodes after some of his meals based on his carbohydrate intake, may be higher after eating. His weight recently appears to be stabilized He is on Jardiance from his nephrologist but not clear what dose he is taking  Exercise: Walking at work, up to 4 miles  Diet management: Trying to eat more greens, less fried food or rice  Information obtained from his Dexcom G6 download shows the following interpretation  HIGHEST blood sugars are on average about 3-4 AM and lowest about 5-6 AM HYPERGLYCEMIC episodes above his target range occurring periodically during the night, occasionally as a rebound after a low sugar and randomly after one of his meals HYPOGLYCEMIC episodes are being seen sporadically on 7-8 AM and occasionally low or low normal between about 5-9 PM POSTPRANDIAL blood sugars generally are not rising significantly with only  occasional increases randomly at different meals Since he has variable sleeping hours and difficult to assess overnight readings  CGM use % of time 85  2-week average/GV 130/35  Time in range       80%  % Time Above 180 12+2  % Time above 250   % Time Below 70 6   HIGHEST average blood sugar 161 noted around 3 AM and LOWEST blood sugar 94 at about 6 PM   Dietician visit: Most recent:    Years ago  Weight control:  Wt Readings from Last 3 Encounters:  05/19/22 211 lb 3.2 oz (95.8 kg)  04/22/22 208 lb (94.3 kg)  03/11/22 210 lb 12.8 oz (95.6 kg)            Diabetes labs:  Lab Results  Component Value Date   HGBA1C 6.0 05/15/2022   HGBA1C 5.6 02/17/2022   HGBA1C 5.3 11/12/2021   Lab Results  Component Value Date   MICROALBUR 842.6 09/29/2021   LDLCALC 65 12/22/2021   CREATININE 2.05 (H) 05/15/2022    Lab Results  Component Value Date   FRUCTOSAMINE 316 (H) 05/15/2022   FRUCTOSAMINE 301 (H) 07/07/2021   FRUCTOSAMINE 585 (H) 08/01/2019     Allergies as of 05/19/2022   No Known Allergies      Medication List        Accurate as of May 19, 2022  1:11 PM. If you have any questions, ask your nurse or doctor.  Azelastine HCl 137 MCG/SPRAY Soln INHALE 2 SPRAYS INTO BOTH NOSTRIL AT BEDTIME AS NEEDED FOR RHINITIS OR ALLERGIES   B-D UF III MINI PEN NEEDLES 31G X 5 MM Misc Generic drug: Insulin Pen Needle USE AS DIRECTED EVERY DAY   Dexcom G6 Sensor Misc 1 Device by Does not apply route See admin instructions. Change every 10 days   ezetimibe 10 MG tablet Commonly known as: ZETIA TAKE 1 TABLET BY MOUTH EVERY DAY   FDgard 25-20.75 MG Caps Generic drug: [place] Oil-Levomenthol Take as directed   insulin glargine 100 UNIT/ML Solostar Pen Commonly known as: LANTUS Inject 80 Units into the skin every morning. And pen needles 1/day   losartan 50 MG tablet Commonly known as: COZAAR Take 1 tablet (50 mg total) by mouth daily.   OneTouch Delica  Plus CVELFY10F Misc TEST IN THE MORNING AND AT  BEDTIME   OneTouch Verio test strip Generic drug: glucose blood TEST IN THE MORNING AND AT  BEDTIME   OneTouch Verio w/Device Kit 1 each by Does not apply route 2 (two) times daily. E11.9   OVER THE COUNTER MEDICATION Take 4 capsules by mouth daily. Nitric oxide supplement   OVER THE COUNTER MEDICATION Take 1 application by mouth daily as needed. Pre workout vitamin supplement mix   pantoprazole 40 MG tablet Commonly known as: PROTONIX Take 1 tablet (40 mg total) by mouth daily.   polyethylene glycol 17 g packet Commonly known as: MiraLax Take 17 g by mouth daily.   pravastatin 40 MG tablet Commonly known as: PRAVACHOL TAKE 1 TABLET BY MOUTH EVERY DAY IN THE EVENING   Protein Powd Take 1 scoop by mouth daily as needed.   sildenafil 100 MG tablet Commonly known as: VIAGRA Take 0.5-1 tablets (50-100 mg total) by mouth daily as needed for erectile dysfunction.   Trelegy Ellipta 200-62.5-25 MCG/ACT Aepb Generic drug: Fluticasone-Umeclidin-Vilant Inhale 1 puff into the lungs daily.   Trulicity 1.5 BP/1.0CH Sopn Generic drug: Dulaglutide Inject 1.5 mg into the skin once a week.        Allergies: No Known Allergies  Past Medical History:  Diagnosis Date   ALLERGIC RHINITIS 03/15/2007   CKD (chronic kidney disease) stage 3, GFR 30-59 ml/min (HCC)    CRI (chronic renal insufficiency)    CRI, creat 1.4 , renal u/s 04-2011 neg   DIABETES MELLITUS, TYPE II 03/15/2007   Diplopia 12/24/2009   Elevated LFTs    ERECTILE DYSFUNCTION 03/15/2007   GOITER, MULTINODULAR 03/21/2010   HYPERLIPIDEMIA 03/15/2007    Past Surgical History:  Procedure Laterality Date   LASIK Left 10/2012    Family History  Problem Relation Age of Onset   Heart disease Mother        CABG-onset in her early 36's   Diabetes Mother 45       had DM from her 81's   Hypertension Mother    Prostate cancer Father 26       dx in his 49s   Stroke  Neg Hx    Colon cancer Neg Hx     Social History:  reports that he quit smoking about 28 years ago. His smoking use included cigarettes. He has a 5.00 pack-year smoking history. He has never used smokeless tobacco. He reports that he does not drink alcohol and does not use drugs.  Review of Systems:  Last diabetic eye exam date 2023, report not available, reportedly has a mild retinopathy  Last foot exam date: 9/22  Symptoms of neuropathy:  None  Hypertension:   Treatment includes losartan, also reportedly on Jardiance  BP Readings from Last 3 Encounters:  05/19/22 112/78  04/22/22 100/60  03/11/22 138/80   CKD followed by nephrologist:  Lab Results  Component Value Date   CREATININE 2.05 (H) 05/15/2022   CREATININE 1.87 (H) 03/11/2022   CREATININE 2.10 (H) 12/22/2021     Lipid management: Currently taking pravastatin and Zetia, followed by cardiology    Lab Results  Component Value Date   CHOL 136 12/22/2021   CHOL 169 10/10/2021   CHOL 177 01/29/2021   Lab Results  Component Value Date   HDL 39 (L) 12/22/2021   HDL 39.70 10/10/2021   HDL 45.30 01/29/2021   Lab Results  Component Value Date   LDLCALC 65 12/22/2021   [place] 95 10/10/2021   LDLCALC 106 (H) 01/29/2021   Lab Results  Component Value Date   TRIG 190 (H) 12/22/2021   TRIG 174.0 (H) 10/10/2021   TRIG 132.0 01/29/2021   Lab Results  Component Value Date   CHOLHDL 4 10/10/2021   CHOLHDL 4 01/29/2021   CHOLHDL 4 04/02/2020   Lab Results  Component Value Date   LDLDIRECT 66 12/22/2021   LDLDIRECT 78.0 04/02/2020   LDLDIRECT 101.0 10/03/2019     Examination:   BP 112/78   Pulse 91   Ht 6' (1.829 m)   Wt 211 lb 3.2 oz (95.8 kg)   SpO2 94%   BMI 28.64 kg/m   Body mass index is 28.64 kg/m.    ASSESSMENT/ PLAN:    Diabetes type 2 insulin-dependent:   Current regimen: Lantus, Jardiance and Trulicity  See history of present illness for detailed discussion of current diabetes  management, blood sugar patterns and problems identified Dexcom G6 meter was downloaded and analyzed as above  A1c is 6%  Blood glucose control is generally excellent with sporadic hyperglycemia after meals but also some tendency to hypoglycemia especially late afternoon and occasionally early morning He has done better with Trulicity and is now appearing to need less basal insulin No consistent postprandial hyperglycemia seen His blood sugar patterns and day-to-day management and lifestyle were discussed  Recommendations:  He will need to reduce his LANTUS insulin dose overall to avoid hypoglycemia and will go down to 70 units Especially since his blood sugar tends to be lower around 5-6 PM and higher overnight on most days he will move the insulin to dinnertime instead of in the mornings We will consider switching to [place] once he finishes his supply of Lantus Also may benefit further from increasing Trulicity up to 3 mg but he still has at least 2 months supply of the 1.5 mg and will do this on the next prescription Consider switching to [place] sensor and he will let us know when he is close to getting a refill, discussed that this can be used without a transmitter and he can use the clarity app on his smart phone to monitor his blood sugars Discussed blood sugar targets fasting and before meals  HYPERLIPIDEMIA: Except for mild increase in triglycerides previously he has good control of LDL  Hypertension: Well-controlled  Patient Instructions  Take 70 Lantus at 6 pm   Message before Trulicity out to go up to 60m   AElayne Snare7/25/2023, 1:11 PM

## 2022-05-25 ENCOUNTER — Encounter: Payer: Self-pay | Admitting: Endocrinology

## 2022-06-16 ENCOUNTER — Other Ambulatory Visit: Payer: Self-pay | Admitting: Internal Medicine

## 2022-06-17 ENCOUNTER — Other Ambulatory Visit (HOSPITAL_COMMUNITY): Payer: Self-pay

## 2022-06-17 ENCOUNTER — Ambulatory Visit: Payer: 59

## 2022-06-17 DIAGNOSIS — I429 Cardiomyopathy, unspecified: Secondary | ICD-10-CM

## 2022-06-25 ENCOUNTER — Ambulatory Visit: Payer: 59 | Admitting: Cardiology

## 2022-06-30 ENCOUNTER — Other Ambulatory Visit: Payer: Self-pay

## 2022-06-30 DIAGNOSIS — N1831 Chronic kidney disease, stage 3a: Secondary | ICD-10-CM

## 2022-06-30 DIAGNOSIS — Z794 Long term (current) use of insulin: Secondary | ICD-10-CM

## 2022-06-30 MED ORDER — DEXCOM G6 SENSOR MISC: 2 refills | Status: DC

## 2022-06-30 MED ORDER — DEXCOM G6 TRANSMITTER MISC: 2 refills | Status: DC

## 2022-07-06 ENCOUNTER — Other Ambulatory Visit: Payer: Self-pay | Admitting: Physician Assistant

## 2022-07-06 DIAGNOSIS — R140 Abdominal distension (gaseous): Secondary | ICD-10-CM

## 2022-07-16 ENCOUNTER — Other Ambulatory Visit: Payer: Self-pay

## 2022-07-16 ENCOUNTER — Other Ambulatory Visit: Payer: Self-pay | Admitting: Cardiology

## 2022-07-16 DIAGNOSIS — E1169 Type 2 diabetes mellitus with other specified complication: Secondary | ICD-10-CM

## 2022-07-16 DIAGNOSIS — N1831 Chronic kidney disease, stage 3a: Secondary | ICD-10-CM

## 2022-07-16 DIAGNOSIS — Z8249 Family history of ischemic heart disease and other diseases of the circulatory system: Secondary | ICD-10-CM

## 2022-07-16 MED ORDER — INSULIN GLARGINE 100 UNIT/ML SOLOSTAR PEN: PEN_INJECTOR | SUBCUTANEOUS | 1 refills | Status: DC

## 2022-08-14 ENCOUNTER — Encounter: Payer: Self-pay | Admitting: Internal Medicine

## 2022-08-14 LAB — HM DIABETES EYE EXAM

## 2022-08-26 ENCOUNTER — Other Ambulatory Visit (INDEPENDENT_AMBULATORY_CARE_PROVIDER_SITE_OTHER): Payer: 59

## 2022-08-26 DIAGNOSIS — E1122 Type 2 diabetes mellitus with diabetic chronic kidney disease: Secondary | ICD-10-CM | POA: Diagnosis not present

## 2022-08-26 DIAGNOSIS — Z794 Long term (current) use of insulin: Secondary | ICD-10-CM | POA: Diagnosis not present

## 2022-08-26 DIAGNOSIS — N1831 Chronic kidney disease, stage 3a: Secondary | ICD-10-CM

## 2022-08-26 LAB — BASIC METABOLIC PANEL
BUN: 57 mg/dL — ABNORMAL HIGH (ref 6–23)
CO2: 27 mEq/L (ref 19–32)
Calcium: 10.9 mg/dL — ABNORMAL HIGH (ref 8.4–10.5)
Chloride: 103 mEq/L (ref 96–112)
Creatinine, Ser: 2.82 mg/dL — ABNORMAL HIGH (ref 0.40–1.50)
GFR: 23.91 mL/min — ABNORMAL LOW (ref >60.00)
Glucose, Bld: 156 mg/dL — ABNORMAL HIGH (ref 70–99)
Potassium: 4 mEq/L (ref 3.5–5.1)
Sodium: 138 mEq/L (ref 135–145)

## 2022-08-26 LAB — HEMOGLOBIN A1C: Hgb A1c MFr Bld: 6.6 % — ABNORMAL HIGH (ref 4.6–6.5)

## 2022-08-28 ENCOUNTER — Ambulatory Visit (INDEPENDENT_AMBULATORY_CARE_PROVIDER_SITE_OTHER): Payer: 59 | Admitting: Endocrinology

## 2022-08-28 ENCOUNTER — Encounter: Payer: Self-pay | Admitting: Endocrinology

## 2022-08-28 VITALS — BP 110/60 | HR 67 | Ht 72.0 in | Wt 214.4 lb

## 2022-08-28 DIAGNOSIS — E1122 Type 2 diabetes mellitus with diabetic chronic kidney disease: Secondary | ICD-10-CM

## 2022-08-28 DIAGNOSIS — N183 Chronic kidney disease, stage 3 unspecified: Secondary | ICD-10-CM | POA: Diagnosis not present

## 2022-08-28 DIAGNOSIS — E7800 Pure hypercholesterolemia, unspecified: Secondary | ICD-10-CM | POA: Diagnosis not present

## 2022-08-28 DIAGNOSIS — Z794 Long term (current) use of insulin: Secondary | ICD-10-CM | POA: Diagnosis not present

## 2022-08-28 DIAGNOSIS — E1165 Type 2 diabetes mellitus with hyperglycemia: Secondary | ICD-10-CM | POA: Diagnosis not present

## 2022-08-28 MED ORDER — TIRZEPATIDE 5 MG/0.5ML ~~LOC~~ SOAJ: 5.0000 mg | SUBCUTANEOUS | 0 refills | Status: DC

## 2022-08-28 NOTE — Progress Notes (Signed)
Patient ID: Wayne Webb, male   DOB: 09/03/1966, 56 y.o.   MRN: [id number]           Reason for Appointment: Type II Diabetes follow-up   History of Present Illness   Diagnosis date: 2004  Previous history:  Non-insulin hypoglycemic drugs previously used: Trulicity since 1/69 Insulin was started in 2011, has been on either Lantus or Tresiba, also tried on Humalog mix Trulicity was increased to 1.5 mg in 1/23  A1c range in the last few years is: 5.2-12.3  Recent history:     Non-insulin hypoglycemic drugs: Trulicity 1.5 mg weekly, Jardiance 10     Insulin regimen: Lantus 80 units in the evening         Side effects from medications: None  Current self management, blood sugar patterns and problems identified:  A1c is 6.6 compared to 6%  He works night shifts  He is still using the Dexcom G6 sensor and this was reviewed as below Although he was supposed to cut down his Lantus by 10 units he is still taking the same dose Also was told to let us know when he finished his Trulicity and to go up on the dose to 3 mg but he did not let us know Compared to the last visit his blood sugars appear to be higher with less time in range, previously 80% Most of his hyperglycemia appears to be after meals but not consistently Sometimes will have significantly high readings for a few hours  His weight is gradually going up Has not seen a nutritionist in a while Continues to be on Jardiance from nephrologist, 10 mg  Exercise: Walking at work, up to 4 miles  Diet management: Trying to eat more greens, less fried food or rice  Information obtained from his Dexcom G6 download shows the following interpretation  HIGHEST blood sugars are on average again about 4 AM averaging 184 and lowest about 7 PM averaging 141  HYPERGLYCEMIC episodes are occurring at various times with no consistent pattern and more often overnight and midday  OVERNIGHT blood sugars are quite variable but generally  higher until at least 5 AM and no hypoglycemia As above his postprandial readings are rising significantly either overnight when he is working or periodically in the early afternoon or evening with blood sugars as high as 309  No significant hypoglycemia  CGM use % of time 98  2-week average/GV 165/32  Time in range     63   %  % Time Above 180 30  Above 250 6  % Time Below 70 1     Previously:  CGM use % of time 85  2-week average/GV 130/35  Time in range       80%  % Time Above 180 12+2  % Time above 250   % Time Below 70 6   HIGHEST average blood sugar 161 noted around 3 AM and LOWEST blood sugar 94 at about 6 PM   Dietician visit: Most recent:    Years ago  Weight control:  Wt Readings from Last 3 Encounters:  08/28/22 214 lb 6.4 oz (97.3 kg)  05/19/22 211 lb 3.2 oz (95.8 kg)  04/22/22 208 lb (94.3 kg)            Diabetes labs:  Lab Results  Component Value Date   HGBA1C 6.6 (H) 08/26/2022   HGBA1C 6.0 05/15/2022   HGBA1C 5.6 02/17/2022   Lab Results  Component Value Date   MICROALBUR  842.6 09/29/2021   LDLCALC 65 12/22/2021   CREATININE 2.82 (H) 08/26/2022    Lab Results  Component Value Date   FRUCTOSAMINE 316 (H) 05/15/2022   FRUCTOSAMINE 301 (H) 07/07/2021   FRUCTOSAMINE 585 (H) 08/01/2019     Allergies as of 08/28/2022   No Known Allergies      Medication List        Accurate as of August 28, 2022  6:41 PM. If you have any questions, ask your nurse or doctor.          Azelastine HCl 137 MCG/SPRAY Soln INHALE 2 SPRAYS INTO BOTH NOSTRIL AT BEDTIME AS NEEDED FOR RHINITIS OR ALLERGIES   B-D UF III MINI PEN NEEDLES 31G X 5 MM Misc Generic drug: Insulin Pen Needle USE AS DIRECTED EVERY DAY   Dexcom G6 Sensor Misc 1 Device by Does not apply route See admin instructions. Change every 10 days   Dexcom G6 Sensor Misc Change every 10 days.   Dexcom G6 Transmitter Misc Change every 90 days   ezetimibe 10 MG tablet Commonly known  as: ZETIA TAKE 1 TABLET BY MOUTH EVERY DAY   FDgard 25-20.75 MG Caps Generic drug: [place] Oil-Levomenthol Take as directed   insulin glargine 100 UNIT/ML Solostar Pen Commonly known as: LANTUS Take 70 units at 6 p.m.   Jardiance 10 MG Tabs tablet Generic drug: empagliflozin Take 10 mg by mouth daily.   losartan 50 MG tablet Commonly known as: COZAAR Take 1 tablet (50 mg total) by mouth daily.   OneTouch Delica Plus [id number] Misc TEST IN THE MORNING AND AT  BEDTIME   OneTouch Verio test strip Generic drug: glucose blood TEST IN THE MORNING AND AT  BEDTIME   OneTouch Verio w/Device Kit 1 each by Does not apply route 2 (two) times daily. E11.9   OVER THE COUNTER MEDICATION Take 4 capsules by mouth daily. Nitric oxide supplement   OVER THE COUNTER MEDICATION Take 1 application by mouth daily as needed. Pre workout vitamin supplement mix   pantoprazole 40 MG tablet Commonly known as: PROTONIX TAKE 1 TABLET BY MOUTH EVERY DAY   polyethylene glycol 17 g packet Commonly known as: MiraLax Take 17 g by mouth daily.   pravastatin 40 MG tablet Commonly known as: PRAVACHOL TAKE 1 TABLET BY MOUTH EVERY DAY IN THE EVENING   Protein Powd Take 1 scoop by mouth daily as needed.   rosuvastatin 5 MG tablet Commonly known as: CRESTOR TAKE 1 TABLET BY MOUTH EVERYDAY AT BEDTIME   sildenafil 100 MG tablet Commonly known as: VIAGRA Take 0.5-1 tablets (50-100 mg total) by mouth daily as needed for erectile dysfunction.   tirzepatide 5 MG/0.5ML Pen Commonly known as: MOUNJARO Inject 5 mg into the skin once a week. Started by: Elayne Snare, MD   Trelegy Ellipta 200-62.5-25 MCG/ACT Aepb Generic drug: Fluticasone-Umeclidin-Vilant Inhale 1 puff into the lungs daily.   Trulicity 1.5 UX/3.2GM Sopn Generic drug: Dulaglutide Inject 1.5 mg into the skin once a week.        Allergies: No Known Allergies  Past Medical History:  Diagnosis Date   ALLERGIC RHINITIS 03/15/2007    CKD (chronic kidney disease) stage 3, GFR 30-59 ml/min (HCC)    CRI (chronic renal insufficiency)    CRI, creat 1.4 , renal u/s 04-2011 neg   DIABETES MELLITUS, TYPE II 03/15/2007   Diplopia 12/24/2009   Elevated LFTs    ERECTILE DYSFUNCTION 03/15/2007   GOITER, MULTINODULAR 03/21/2010   HYPERLIPIDEMIA 03/15/2007  Past Surgical History:  Procedure Laterality Date   LASIK Left 10/2012    Family History  Problem Relation Age of Onset   Heart disease Mother        CABG-onset in her early 42's   Diabetes Mother 74       had DM from her 66's   Hypertension Mother    Prostate cancer Father 2       dx in his 75s   Stroke Neg Hx    Colon cancer Neg Hx     Social History:  reports that he quit smoking about 28 years ago. His smoking use included cigarettes. He has a 5.00 pack-year smoking history. He has never used smokeless tobacco. He reports that he does not drink alcohol and does not use drugs.  Review of Systems:  Last diabetic eye exam date 2023, report not available, reportedly has a mild retinopathy  Last foot exam date: 9/22  Symptoms of neuropathy: None  Hypertension:   Treatment includes losartan 50 mg  BP Readings from Last 3 Encounters:  08/28/22 110/60  05/19/22 112/78  04/22/22 100/60   CKD followed by nephrologist:  Lab Results  Component Value Date   CREATININE 2.82 (H) 08/26/2022   CREATININE 2.05 (H) 05/15/2022   CREATININE 1.87 (H) 03/11/2022     Lipid management: Currently taking pravastatin and Zetia, followed by cardiology    Lab Results  Component Value Date   CHOL 136 12/22/2021   CHOL 169 10/10/2021   CHOL 177 01/29/2021   Lab Results  Component Value Date   HDL 39 (L) 12/22/2021   HDL 39.70 10/10/2021   HDL 45.30 01/29/2021   Lab Results  Component Value Date   LDLCALC 65 12/22/2021   [place] 95 10/10/2021   LDLCALC 106 (H) 01/29/2021   Lab Results  Component Value Date   TRIG 190 (H) 12/22/2021   TRIG 174.0 (H)  10/10/2021   TRIG 132.0 01/29/2021   Lab Results  Component Value Date   CHOLHDL 4 10/10/2021   CHOLHDL 4 01/29/2021   CHOLHDL 4 04/02/2020   Lab Results  Component Value Date   LDLDIRECT 66 12/22/2021   LDLDIRECT 78.0 04/02/2020   LDLDIRECT 101.0 10/03/2019     Examination:   BP 110/60   Pulse 67   Ht 6' (1.829 m)   Wt 214 lb 6.4 oz (97.3 kg)   SpO2 98%   BMI 29.08 kg/m   Body mass index is 29.08 kg/m.    ASSESSMENT/ PLAN:    Diabetes type 2 insulin-dependent:   Current regimen: Lantus 80 units, Jardiance and Trulicity 1.5  See history of present illness for detailed discussion of current diabetes management, blood sugar patterns and problems identified Dexcom G6 information was obtained from his phone app and analyzed as above  A1c is 6.6 %  Blood glucose control is not as good as last time I am not clear why He also is gradually gaining weight He does appear to be having higher postprandial readings at times based on his diet Also despite taking Lantus in the evenings his blood sugars are still the highest overnight He is more active at work which is mostly at night His blood sugar patterns and day-to-day management and medications were discussed in detail  Recommendations:  He will need to increase his Trulicity to 3 mg for now and then finished switch to Mounjaro 5 mg weekly Discussed differences between Trulicity and Mounjaro Also likely may be able to titrate the [employer]  progressively to obtain better control and weight loss Also discussed that he may need to reduce his Lantus when he is on higher dose of Trulicity and likely will need to reduce his at least 5 units next Monday He will also let us know when he finishes Lantus and start using Toujeo which will be better active over 24 hours, also discussed that this would be more convenient because of the high insulin doses that he is using We will also refer him for diabetes education to review various  factors including diet Discussed blood sugar targets fasting and before meals   Hypertension: Blood pressure below normal Because of his increasing creatinine he can reduce his losartan to half tablet until seen by nephrologist, labs faxed to nephrology office  Patient Instructions  Take Trulicity 44m weekly, then Mounjaro 576m Lantus 75 U starting Monday  Call before refilling Lantus, Rx will be Toujeo  Take 1/2 Losartan   AjElayne Snare1/12/2021, 6:41 PM

## 2022-08-28 NOTE — Patient Instructions (Addendum)
Take Trulicity '3mg'$  weekly, then Mounjaro '5mg'$   Lantus 75 U starting Monday  Call before refilling Lantus, Rx will be Toujeo  Take 1/2 Losartan

## 2022-09-03 ENCOUNTER — Encounter: Payer: Self-pay | Admitting: Endocrinology

## 2022-10-05 ENCOUNTER — Other Ambulatory Visit: Payer: Self-pay

## 2022-10-05 DIAGNOSIS — E1165 Type 2 diabetes mellitus with hyperglycemia: Secondary | ICD-10-CM

## 2022-10-05 MED ORDER — TIRZEPATIDE 5 MG/0.5ML ~~LOC~~ SOAJ: 5.0000 mg | SUBCUTANEOUS | 0 refills | Status: DC

## 2022-10-26 ENCOUNTER — Other Ambulatory Visit: Payer: Self-pay | Admitting: Cardiology

## 2022-10-26 DIAGNOSIS — E1169 Type 2 diabetes mellitus with other specified complication: Secondary | ICD-10-CM

## 2022-10-26 DIAGNOSIS — Z8249 Family history of ischemic heart disease and other diseases of the circulatory system: Secondary | ICD-10-CM

## 2022-10-26 DIAGNOSIS — Z794 Long term (current) use of insulin: Secondary | ICD-10-CM

## 2022-11-09 ENCOUNTER — Other Ambulatory Visit: Payer: Self-pay | Admitting: Physician Assistant

## 2022-11-09 ENCOUNTER — Other Ambulatory Visit: Payer: Self-pay | Admitting: Internal Medicine

## 2022-11-09 DIAGNOSIS — R140 Abdominal distension (gaseous): Secondary | ICD-10-CM

## 2022-11-13 ENCOUNTER — Encounter: Payer: Self-pay | Admitting: Internal Medicine

## 2022-11-13 ENCOUNTER — Telehealth: Payer: Self-pay

## 2022-11-13 MED ORDER — SILDENAFIL CITRATE 20 MG PO TABS: 60.0000 mg | ORAL_TABLET | Freq: Every evening | ORAL | 0 refills | Status: DC | PRN

## 2022-11-13 NOTE — Telephone Encounter (Signed)
Letter sent to My chart

## 2022-11-13 NOTE — Telephone Encounter (Signed)
Patient is requesting refill on sildenafil. Last Rx was sent by Loanne Drilling.

## 2022-11-13 NOTE — Telephone Encounter (Signed)
Let patient know I refilled his medication, due for a office visit.

## 2022-12-06 ENCOUNTER — Other Ambulatory Visit: Payer: Self-pay | Admitting: Endocrinology

## 2022-12-06 DIAGNOSIS — E1165 Type 2 diabetes mellitus with hyperglycemia: Secondary | ICD-10-CM

## 2022-12-09 ENCOUNTER — Other Ambulatory Visit: Payer: Self-pay | Admitting: Internal Medicine

## 2022-12-28 ENCOUNTER — Ambulatory Visit (INDEPENDENT_AMBULATORY_CARE_PROVIDER_SITE_OTHER): Payer: 59 | Admitting: Internal Medicine

## 2022-12-28 ENCOUNTER — Other Ambulatory Visit (INDEPENDENT_AMBULATORY_CARE_PROVIDER_SITE_OTHER): Payer: 59

## 2022-12-28 ENCOUNTER — Encounter: Payer: Self-pay | Admitting: Internal Medicine

## 2022-12-28 VITALS — BP 116/78 | HR 73 | Temp 97.8°F | Resp 18 | Ht 72.0 in | Wt 207.4 lb

## 2022-12-28 DIAGNOSIS — Z0001 Encounter for general adult medical examination with abnormal findings: Secondary | ICD-10-CM

## 2022-12-28 DIAGNOSIS — E7800 Pure hypercholesterolemia, unspecified: Secondary | ICD-10-CM | POA: Diagnosis not present

## 2022-12-28 DIAGNOSIS — Z125 Encounter for screening for malignant neoplasm of prostate: Secondary | ICD-10-CM | POA: Diagnosis not present

## 2022-12-28 DIAGNOSIS — I10 Essential (primary) hypertension: Secondary | ICD-10-CM

## 2022-12-28 DIAGNOSIS — Z0000 Encounter for general adult medical examination without abnormal findings: Secondary | ICD-10-CM

## 2022-12-28 DIAGNOSIS — N189 Chronic kidney disease, unspecified: Secondary | ICD-10-CM

## 2022-12-28 DIAGNOSIS — E1122 Type 2 diabetes mellitus with diabetic chronic kidney disease: Secondary | ICD-10-CM

## 2022-12-28 DIAGNOSIS — E1165 Type 2 diabetes mellitus with hyperglycemia: Secondary | ICD-10-CM

## 2022-12-28 DIAGNOSIS — E785 Hyperlipidemia, unspecified: Secondary | ICD-10-CM

## 2022-12-28 DIAGNOSIS — J849 Interstitial pulmonary disease, unspecified: Secondary | ICD-10-CM

## 2022-12-28 DIAGNOSIS — N1831 Chronic kidney disease, stage 3a: Secondary | ICD-10-CM

## 2022-12-28 DIAGNOSIS — Z794 Long term (current) use of insulin: Secondary | ICD-10-CM

## 2022-12-28 LAB — CBC WITH DIFFERENTIAL/PLATELET
Basophils Absolute: 0 10*3/uL (ref 0.0–0.1)
Basophils Relative: 1 % (ref 0.0–3.0)
Eosinophils Absolute: 0.4 10*3/uL (ref 0.0–0.7)
Eosinophils Relative: 8.7 % — ABNORMAL HIGH (ref 0.0–5.0)
HCT: 35.2 % — ABNORMAL LOW (ref 39.0–52.0)
Hemoglobin: 11.8 g/dL — ABNORMAL LOW (ref 13.0–17.0)
Lymphocytes Relative: 16.2 % (ref 12.0–46.0)
Lymphs Abs: 0.8 10*3/uL (ref 0.7–4.0)
MCHC: 33.5 g/dL (ref 30.0–36.0)
MCV: 88.9 fl (ref 78.0–100.0)
Monocytes Absolute: 0.9 10*3/uL (ref 0.1–1.0)
Monocytes Relative: 18.5 % — ABNORMAL HIGH (ref 3.0–12.0)
Neutro Abs: 2.7 10*3/uL (ref 1.4–7.7)
Neutrophils Relative %: 55.6 % (ref 43.0–77.0)
Platelets: 173 10*3/uL (ref 150.0–400.0)
RBC: 3.96 Mil/uL — ABNORMAL LOW (ref 4.22–5.81)
RDW: 12.9 % (ref 11.5–15.5)
WBC: 4.8 10*3/uL (ref 4.0–10.5)

## 2022-12-28 LAB — COMPREHENSIVE METABOLIC PANEL
ALT: 42 U/L (ref 0–53)
AST: 40 U/L — ABNORMAL HIGH (ref 0–37)
Albumin: 4.1 g/dL (ref 3.5–5.2)
Alkaline Phosphatase: 109 U/L (ref 39–117)
BUN: 52 mg/dL — ABNORMAL HIGH (ref 6–23)
CO2: 26 mEq/L (ref 19–32)
Calcium: 10.6 mg/dL — ABNORMAL HIGH (ref 8.4–10.5)
Chloride: 104 mEq/L (ref 96–112)
Creatinine, Ser: 2.69 mg/dL — ABNORMAL HIGH (ref 0.40–1.50)
GFR: 25.24 mL/min — ABNORMAL LOW (ref >60.00)
Glucose, Bld: 73 mg/dL (ref 70–99)
Potassium: 4.4 mEq/L (ref 3.5–5.1)
Sodium: 140 mEq/L (ref 135–145)
Total Bilirubin: 0.8 mg/dL (ref 0.2–1.2)
Total Protein: 7.8 g/dL (ref 6.0–8.3)

## 2022-12-28 LAB — GLUCOSE, RANDOM: Glucose, Bld: 78 mg/dL (ref 70–99)

## 2022-12-28 LAB — PSA: PSA: 2.66 ng/mL (ref 0.10–4.00)

## 2022-12-28 LAB — LDL CHOLESTEROL, DIRECT: Direct LDL: 62 mg/dL

## 2022-12-28 LAB — HEMOGLOBIN A1C: Hgb A1c MFr Bld: 5.3 % (ref 4.6–6.5)

## 2022-12-28 NOTE — Assessment & Plan Note (Signed)
Here for CPX Diabetes: Saw endocrinology 08/28/2022. GI, elevated LFTs Saw GI 03/2022.  He had abdominal bloating, chronic abdominal pain, elevated LFTs, chronic constipation, they noted negative workup for increased LFTs and a recent ERCP showed no biliary abnormalities. Nonischemic cardiomyopathy. Saw cardiology 11-2021, Dyslipidemia:  patient currently taking Zetia, Pravachol and rosuvastatin. Last LDL was 66 a year ago, was recommended by cardiology to stop Zetia and continue Pravachol. Plan: Stop rosuvastatin.  Check FLP.  Further blood work results. CKD: Saw nephrology 09/2022, proteinuria noted to improve with medical management.  They recommended to continue losartan and Jardiance. Hypercalcemia: Per nephrology. Interstitial pneumonia abnormal lung imaging: Last visit with pulmonary 2022, had extensive workup, although he is doing well clinically, I recommend to see them again.  Will place a referral. RTC 6 months

## 2022-12-28 NOTE — Progress Notes (Addendum)
Subjective:    Patient ID: Wayne Webb, male    DOB: 1966-06-02, 57 y.o.   MRN: [id number]  DOS:  12/28/2022 Type of visit - description: CPX  Here for CPX Feels well. Able to exercise, has lost a couple of pounds. Denies difficulty breathing, DOE, cough or wheezing. No GU symptoms  Review of Systems  Other than above, a 14 point review of systems is negative     Past Medical History:  Diagnosis Date   ALLERGIC RHINITIS 03/15/2007   CKD (chronic kidney disease) stage 3, GFR 30-59 ml/min (HCC)    CRI (chronic renal insufficiency)    CRI, creat 1.4 , renal u/s 04-2011 neg   DIABETES MELLITUS, TYPE II 03/15/2007   Diplopia 12/24/2009   Elevated LFTs    ERECTILE DYSFUNCTION 03/15/2007   GOITER, MULTINODULAR 03/21/2010   HYPERLIPIDEMIA 03/15/2007    Past Surgical History:  Procedure Laterality Date   LASIK Left 10/2012   Social History   Socioeconomic History   Marital status: Divorced    Spouse name: Not on file   Number of children: 1   Years of education: Not on file   Highest education level: Not on file  Occupational History   Occupation: active job ([employer])    Comment: Maintenence   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    Household: pt and daughter    Social Determinants of Corporate investment banker Strain: Not on file  Food Insecurity: Not on file  Transportation Needs: Not on file  Physical Activity: Not on file  Stress: Not on file  Social Connections: Not on file  Intimate Partner Violence: Not on file     Current Outpatient  Medications  Medication Instructions   Azelastine HCl 137 MCG/SPRAY SOLN INHALE 2 SPRAYS INTO BOTH NOSTRIL AT BEDTIME AS NEEDED FOR RHINITIS OR ALLERGIES   [place] Oil-Levomenthol (FDGARD) 25-20.75 MG CAPS Take as directed   Continuous Blood Gluc Sensor (DEXCOM G6 SENSOR) MISC 1 Device, Does not apply, See admin instructions, Change every 10 days   Continuous Blood Gluc Sensor (DEXCOM G6 SENSOR) MISC Change every 10 days.   Continuous Blood Gluc Transmit (DEXCOM G6 TRANSMITTER) MISC Change every 90 days   ezetimibe (ZETIA) 10 MG tablet TAKE 1 TABLET BY MOUTH EVERY DAY   Fluticasone-Umeclidin-Vilant (TRELEGY ELLIPTA) 200-62.5-25 MCG/INH AEPB 1 puff, Inhalation, Daily   insulin glargine (LANTUS) 100 UNIT/ML Solostar Pen Take 70 units at 6 p.m.   Insulin Pen Needle (B-D UF III MINI PEN NEEDLES) 31G X 5 MM MISC USE AS DIRECTED EVERY DAY   Jardiance 10 mg, Oral, Daily   losartan (COZAAR) 50 mg, Oral, Daily   OVER THE COUNTER MEDICATION 4 capsules, Oral, Daily, Nitric oxide supplement    OVER THE COUNTER MEDICATION 1 application , Oral, Daily PRN, Pre workout vitamin supplement mix    pantoprazole (PROTONIX) 40 mg, Oral, Daily   polyethylene glycol (MIRALAX) 17  g, Oral, Daily   pravastatin (PRAVACHOL) 40 MG tablet TAKE 1 TABLET BY MOUTH EVERY DAY IN THE EVENING   Protein POWD 1 scoop, Oral, Daily PRN   sildenafil (REVATIO) 60-80 mg, Oral, At bedtime PRN   tirzepatide (MOUNJARO) 5 MG/0.5ML Pen INJECT THE CONTENTS OF ONE PEN  SUBCUTANEOUSLY WEEKLY AS  DIRECTED   Trulicity 1.5 mg, Subcutaneous, Weekly       Objective:   Physical Exam BP 116/78   Pulse 73   Temp 97.8 F (36.6 C) (Oral)   Resp 18   Ht 6' (1.829 m)   Wt 207 lb 6 oz (94.1 kg)   SpO2 97%   BMI 28.13 kg/m  General: Well developed, NAD, BMI noted Neck: No  thyromegaly  HEENT:  Normocephalic . Face symmetric, atraumatic Lungs:  CTA B Normal respiratory effort, no intercostal retractions, no accessory muscle use. Heart:  RRR,  no murmur.  Abdomen:  Not distended, soft, non-tender. No rebound or rigidity.   Lower extremities: no pretibial edema bilaterally DRE: Normal sphincter tone, brown stools, prostate difficult to reach, slightly enlarged? Skin: Exposed areas without rash. Not pale. Not jaundice Neurologic:  alert & oriented X3.  Speech normal, gait appropriate for age and unassisted Strength symmetric and appropriate for age.  Psych: Cognition and judgment appear intact.  Cooperative with normal attention span and concentration.  Behavior appropriate. No anxious or depressed appearing.     Assessment   Assessment DM - dr Everardo All Hyperlipidemia (h/o increased LFTs w/ simva 2013) Goiter -- per dr Everardo All, last [place] 06/2016 stable CKD - renal US (-) 2012 ; not f/u  by nephrology as off 09/2018 ED Elevated LFTs Ultrasound 2015 negative,  hepatitis serologies (-) 01-2015: ferritin (-) previously slt elevated; wnl  transferrin saturation, ANA, ceruloplasmin, anti smooth muscles ab. Alpha 1 antitrypsin.  Saw GI 10/2017: U/S wnl, labs borderline abnormal was RX a liver Bx Pulmonary: --DOE --Chronic Cough, better after ACEi d/c . Dr. Sherene Sires -- Abnormal lung imaging: dx, Burnout sarcoidosis, then possibly interstitial pneumonia CV:  Dr Odis Hollingshead     -Cardiomyopathy: saw cards d/t CP, w/u 7- 2022: Coronary calcium score 0, stress test low risk, echo: 35 to 40%.       PLAN: Here for CPX Diabetes: Saw endocrinology 08/28/2022. GI, elevated LFTs Saw GI 03/2022.  He had abdominal bloating, chronic abdominal pain, elevated LFTs, chronic constipation, they noted negative workup for increased LFTs and a ERCP w/ no biliary abnormalities. Nonischemic cardiomyopathy. Saw cardiology 11-2021, Dyslipidemia:  patient currently taking Zetia, Pravachol and rosuvastatin. Last LDL was 66 a year ago, was recommended by cardiology to stop Zetia and continue Pravachol. Plan: Stop rosuvastatin.  Check FLP.  Further advise w/  results. CKD: Saw nephrology 09/2022, was rec  to continue losartan and Jardiance. Hypercalcemia: Per nephrology. Interstitial pneumonia, abnormal lung imaging: Last visit with pulmonary 2022, had extensive workup, although he is doing well clinically, I recommend to see them again.  Will place a referral. RTC 6 months

## 2022-12-28 NOTE — Assessment & Plan Note (Signed)
-  Td 2016 -  PNM 23: 2015; prevnar : 2015; PNM 20: 2022 -shingrix completed -covid vax rec booster recommended -Flu shot every 4 recommended - (+) FH prostate cancer, no symptoms, DRE limited but slightly enlarged gland, check PSA. - labs: CMP CBC PSA - CCS: Colonoscopy, 02/2014, 1 polyp, next 10 years - Diet exercise: Counseled at - Healthcare POA: See AVS

## 2022-12-28 NOTE — Patient Instructions (Addendum)
Stop rosuvastatin  Use inhaler as needed only  Vaccines I recommend:  Covid booster Flu shot every fall  Check the  blood pressure regularly BP GOAL is between 110/65 and  135/85. If it is consistently higher or lower, let me know    GO TO THE LAB : Get the blood work     [place], [place] back for checkup in 6 months    "[place] of attorney" ,  "Living will" (Advance care planning documents)  If you already have a living will or healthcare power of attorney, is recommended you bring the copy to be scanned in your chart.   The document will be available to all the doctors you see in the system.  Advance care planning is a process that supports adults in  understanding and sharing their preferences regarding future medical care.  The patient's preferences are recorded in documents called Advance Directives and the can be modified at any time while the patient is in full mental capacity.   If you don't have one, please consider create one.      More information at: [email or web address]

## 2022-12-29 ENCOUNTER — Other Ambulatory Visit: Payer: Self-pay

## 2022-12-29 ENCOUNTER — Other Ambulatory Visit (INDEPENDENT_AMBULATORY_CARE_PROVIDER_SITE_OTHER): Payer: 59

## 2022-12-29 DIAGNOSIS — N1831 Chronic kidney disease, stage 3a: Secondary | ICD-10-CM

## 2022-12-29 DIAGNOSIS — E1122 Type 2 diabetes mellitus with diabetic chronic kidney disease: Secondary | ICD-10-CM | POA: Diagnosis not present

## 2022-12-29 DIAGNOSIS — Z794 Long term (current) use of insulin: Secondary | ICD-10-CM | POA: Diagnosis not present

## 2022-12-29 LAB — LIPID PANEL
Cholesterol: 132 mg/dL (ref 0–200)
HDL: 35.6 mg/dL — ABNORMAL LOW (ref >39.00)
LDL Cholesterol: 69 mg/dL (ref 0–99)
NonHDL: 96.64
Total CHOL/HDL Ratio: 4
Triglycerides: 139 mg/dL (ref 0.0–149.0)
VLDL: 27.8 mg/dL (ref 0.0–40.0)

## 2022-12-30 ENCOUNTER — Ambulatory Visit (INDEPENDENT_AMBULATORY_CARE_PROVIDER_SITE_OTHER): Payer: 59 | Admitting: Endocrinology

## 2022-12-30 ENCOUNTER — Encounter: Payer: Self-pay | Admitting: Endocrinology

## 2022-12-30 ENCOUNTER — Telehealth: Payer: Self-pay

## 2022-12-30 VITALS — BP 122/64 | HR 76 | Ht 72.0 in | Wt 210.4 lb

## 2022-12-30 DIAGNOSIS — E1122 Type 2 diabetes mellitus with diabetic chronic kidney disease: Secondary | ICD-10-CM | POA: Diagnosis not present

## 2022-12-30 DIAGNOSIS — Z794 Long term (current) use of insulin: Secondary | ICD-10-CM

## 2022-12-30 DIAGNOSIS — N183 Chronic kidney disease, stage 3 unspecified: Secondary | ICD-10-CM | POA: Diagnosis not present

## 2022-12-30 DIAGNOSIS — E7800 Pure hypercholesterolemia, unspecified: Secondary | ICD-10-CM | POA: Diagnosis not present

## 2022-12-30 NOTE — Patient Instructions (Addendum)
Take 55 Lantus in ams  Recommended blood sugar levels on waking up are 80-120 and about 2 hours after meal is 130-160

## 2022-12-30 NOTE — Progress Notes (Signed)
Patient ID: Wayne Webb, male   DOB: [birth year], 57 y.o.   MRN: [id number]           Reason for Appointment: Type II Diabetes follow-up   History of Present Illness   Diagnosis date: 2004  Previous history:  Non-insulin hypoglycemic drugs previously used: Trulicity since 0000000 Insulin was started in 2011, has been on either Lantus or Tresiba, also tried on Humalog mix Trulicity was increased to 1.5 mg in 1/23  A1c range in the last few years is: 5.2-12.3  Recent history:     Non-insulin hypoglycemic drugs: Mounjaro weekly, Jardiance 10     Insulin regimen: Lantus 70 units in the evening         Side effects from medications: None  Current self management, blood sugar patterns and problems identified:  A1c is 5.3 at his lowest level  He works night shifts  He is using the Dexcom G6 sensor as before this was reviewed as below He tends to be relatively more active with his work at night and blood sugars are the lowest in the morning hours and only rarely below 70 but appears to be getting more low sugars overnight in the last week compared to the first week He was able to switch from Trulicity to [place] and has taken it for at least 2 months With this his weight is slightly better than November He thinks he is controlling portion better and his postprandial readings are mostly in the good control  May have been a little variable control in the last week when he was on vacation Is generally active at work but also has tried to go to the gym 3-4 times a week Continues to be on Jardiance from nephrologist, taking 10 mg  Bedtime 8 am Exercise: Walking at work, up to 4 miles  Diet management: Trying to eat more greens, less fried food or rice  Information obtained from his Dexcom G6 download shows the following interpretation  Overall blood sugars are very well-controlled with little fluctuation and some tendency to hypoglycemia in the second week Overnight blood sugars  are generally not above target continue to decrease after 4 to 5 AM and recently low between 8-9 AM at times Mealtime blood sugar control is excellent with only periodic postprandial spikes after lunch or dinner Also in the first week while he was working his blood sugar may be sporadically higher at night   CGM use % of time   2-week average/GV   Time in range  89      %  % Time Above 180 7  % Time above 250   % Time Below 70 3     Previously:  CGM use % of time 98  2-week average/GV 165/32  Time in range     63   %  % Time Above 180 30  Above 250 6  % Time Below 70 1    HIGHEST average blood sugar 161 noted around 3 AM and LOWEST blood sugar 94 at about 6 PM Dietician visit: Most recent:    Years ago  Weight control:  Wt Readings from Last 3 Encounters:  12/30/22 210 lb 6.4 oz (95.4 kg)  12/28/22 207 lb 6 oz (94.1 kg)  08/28/22 214 lb 6.4 oz (97.3 kg)            Diabetes labs:  Lab Results  Component Value Date   HGBA1C 5.3 12/28/2022   HGBA1C 6.6 (H) 08/26/2022   HGBA1C  6.0 05/15/2022   Lab Results  Component Value Date   MICROALBUR 842.6 09/29/2021   LDLCALC 69 12/29/2022   CREATININE 2.69 (H) 12/28/2022    Lab Results  Component Value Date   FRUCTOSAMINE 316 (H) 05/15/2022   FRUCTOSAMINE 301 (H) 07/07/2021   FRUCTOSAMINE 585 (H) 08/01/2019     Allergies as of 12/30/2022   No Known Allergies      Medication List        Accurate as of December 30, 2022 10:45 AM. If you have any questions, ask your nurse or doctor.          Azelastine HCl 137 MCG/SPRAY Soln INHALE 2 SPRAYS INTO BOTH NOSTRIL AT BEDTIME AS NEEDED FOR RHINITIS OR ALLERGIES   B-D UF III MINI PEN NEEDLES 31G X 5 MM Misc Generic drug: Insulin Pen Needle USE AS DIRECTED EVERY DAY   Dexcom G6 Sensor Misc 1 Device by Does not apply route See admin instructions. Change every 10 days   Dexcom G6 Sensor Misc Change every 10 days.   Dexcom G6 Transmitter Misc Change every 90  days   ezetimibe 10 MG tablet Commonly known as: ZETIA TAKE 1 TABLET BY MOUTH EVERY DAY   FDgard 25-20.75 MG Caps Generic drug: [place] Oil-Levomenthol Take as directed   insulin glargine 100 UNIT/ML Solostar Pen Commonly known as: LANTUS Take 70 units at 6 p.m.   Jardiance 10 MG Tabs tablet Generic drug: empagliflozin Take 10 mg by mouth daily.   losartan 50 MG tablet Commonly known as: COZAAR Take 1 tablet (50 mg total) by mouth daily.   Mounjaro 5 MG/0.5ML Pen Generic drug: tirzepatide INJECT THE CONTENTS OF ONE PEN  SUBCUTANEOUSLY WEEKLY AS  DIRECTED   OVER THE COUNTER MEDICATION Take 4 capsules by mouth daily. Nitric oxide supplement   OVER THE COUNTER MEDICATION Take 1 application by mouth daily as needed. Pre workout vitamin supplement mix   pantoprazole 40 MG tablet Commonly known as: PROTONIX TAKE 1 TABLET BY MOUTH EVERY DAY   polyethylene glycol 17 g packet Commonly known as: MiraLax Take 17 g by mouth daily.   pravastatin 40 MG tablet Commonly known as: PRAVACHOL TAKE 1 TABLET BY MOUTH EVERY DAY IN THE EVENING   Protein Powd Take 1 scoop by mouth daily as needed.   sildenafil 20 MG tablet Commonly known as: REVATIO Take 3-4 tablets (60-80 mg total) by mouth at bedtime as needed.   Trelegy Ellipta 200-62.5-25 MCG/ACT Aepb Generic drug: Fluticasone-Umeclidin-Vilant Inhale 1 puff into the lungs daily.   Trulicity 1.5 0000000 Sopn Generic drug: Dulaglutide Inject 1.5 mg into the skin once a week.        Allergies: No Known Allergies  Past Medical History:  Diagnosis Date   ALLERGIC RHINITIS 03/15/2007   CKD (chronic kidney disease) stage 3, GFR 30-59 ml/min (HCC)    CRI (chronic renal insufficiency)    CRI, creat 1.4 , renal u/s 04-2011 neg   DIABETES MELLITUS, TYPE II 03/15/2007   Diplopia 12/24/2009   Elevated LFTs    ERECTILE DYSFUNCTION 03/15/2007   GOITER, MULTINODULAR 03/21/2010   HYPERLIPIDEMIA 03/15/2007    Past Surgical  History:  Procedure Laterality Date   LASIK Left 10/2012    Family History  Problem Relation Age of Onset   Heart disease Mother        CABG-onset in her early 38's   Diabetes Mother 30       had DM from her 30's   Hypertension Mother  Prostate cancer Father 5       dx in his 51s   Stroke Neg Hx    Colon cancer Neg Hx     Social History:  reports that he quit smoking about 29 years ago. His smoking use included cigarettes. He has a 5.00 pack-year smoking history. He has never used smokeless tobacco. He reports that he does not drink alcohol and does not use drugs.  Review of Systems:  Last diabetic eye exam date 2023, report not available, reportedly has a mild retinopathy  Last foot exam date: 9/22  Symptoms of neuropathy: None  Hypertension:   Treatment includes losartan 50 mg  BP Readings from Last 3 Encounters:  12/30/22 122/64  12/28/22 116/78  08/28/22 110/60   CKD followed by nephrologist:  Lab Results  Component Value Date   CREATININE 2.69 (H) 12/28/2022   CREATININE 2.82 (H) 08/26/2022   CREATININE 2.05 (H) 05/15/2022     Lipid management: Currently taking pravastatin and Zetia, has excellent control and better triglycerides now    Lab Results  Component Value Date   CHOL 132 12/29/2022   CHOL 136 12/22/2021   CHOL 169 10/10/2021   Lab Results  Component Value Date   HDL 35.60 (L) 12/29/2022   HDL 39 (L) 12/22/2021   HDL 39.70 10/10/2021   Lab Results  Component Value Date   LDLCALC 69 12/29/2022   LDLCALC 65 12/22/2021   [place] 95 10/10/2021   Lab Results  Component Value Date   TRIG 139.0 12/29/2022   TRIG 190 (H) 12/22/2021   TRIG 174.0 (H) 10/10/2021   Lab Results  Component Value Date   CHOLHDL 4 12/29/2022   CHOLHDL 4 10/10/2021   CHOLHDL 4 01/29/2021   Lab Results  Component Value Date   LDLDIRECT 62.0 12/28/2022   LDLDIRECT 66 12/22/2021   LDLDIRECT 78.0 04/02/2020     Examination:   BP 122/64 (BP Location:  Left Arm, Patient Position: Sitting, Cuff Size: Normal)   Pulse 76   Ht 6' (1.829 m)   Wt 210 lb 6.4 oz (95.4 kg)   SpO2 95%   BMI 28.54 kg/m   Body mass index is 28.54 kg/m.    ASSESSMENT/ PLAN:    Diabetes type 2 insulin-dependent:   Current regimen: Lantus 70 units, Jardiance and Mounjaro 5 mg weekly  See history of present illness for detailed discussion of current diabetes management, blood sugar patterns and problems identified Using Dexcom sensor currently  A1c is 6.6 %  He likely has done much better with Mounjaro compared to Trulicity with some weight loss instead of weight gain as well as much better blood sugar control with the lowest A1c that he has at Now starting get some hypoglycemia also with 70 units of insulin Today discussed blood sugar patterns, actions of insulin, benefits of Mounjaro and meal planning with balanced meals with some protein consistently Also on home likely needs to observe his blood sugar pattern when he is going to the gym  Recommendations:  He will continue the same dose of Mounjaro as this is effective Switch Lantus to the morning to avoid late morning hypoglycemia from nighttime doses and better daytime control He will start with 55 units and adjust up or down 5 units if blood sugars are not in the 90-130 range when he wakes up Discussed that this should not cause hypoglycemia even if he is going to sleep when he comes back from work in the morning Continue excellent lifestyle changes  and attempts to weight loss   Hypertension: Blood pressure normal now  LIPIDS: Excellent control now with 2 drugs  Patient Instructions  Take 55 Lantus in ams   Elayne Snare 12/30/2022, 10:45 AM

## 2022-12-30 NOTE — Telephone Encounter (Signed)
Physical form completed and faxed back to [employer] at [phone]. Form sent for scanning.  Received fax confirmation.

## 2023-01-06 LAB — PROTEIN / CREATININE RATIO, URINE
Albumin, U: 148
Creatinine, Urine: 129.4
Microalb Creat Ratio: 114

## 2023-01-08 ENCOUNTER — Other Ambulatory Visit: Payer: Self-pay | Admitting: Internal Medicine

## 2023-01-11 ENCOUNTER — Other Ambulatory Visit: Payer: Self-pay | Admitting: Internal Medicine

## 2023-01-13 ENCOUNTER — Encounter: Payer: Self-pay | Admitting: Endocrinology

## 2023-01-13 ENCOUNTER — Ambulatory Visit (INDEPENDENT_AMBULATORY_CARE_PROVIDER_SITE_OTHER): Payer: 59 | Admitting: Pulmonary Disease

## 2023-01-13 ENCOUNTER — Encounter: Payer: Self-pay | Admitting: Pulmonary Disease

## 2023-01-13 VITALS — BP 116/64 | HR 84 | Ht 72.0 in | Wt 210.0 lb

## 2023-01-13 DIAGNOSIS — R590 Localized enlarged lymph nodes: Secondary | ICD-10-CM | POA: Diagnosis not present

## 2023-01-13 DIAGNOSIS — J849 Interstitial pulmonary disease, unspecified: Secondary | ICD-10-CM | POA: Diagnosis not present

## 2023-01-13 MED ORDER — TRELEGY ELLIPTA 100-62.5-25 MCG/ACT IN AEPB: 1.0000 | INHALATION_SPRAY | Freq: Every day | RESPIRATORY_TRACT | 0 refills | Status: DC

## 2023-01-13 NOTE — Patient Instructions (Addendum)
We will schedule you for follow up CT Chest scan to follow the lymph nodes in your chest and the scarring on your lungs.   Try Trelegy ellipta 100, 1 puff daily over the next two weeks - rinse mouth out after each use.   Please let us know if you would like to continue this inhaler  Try allegra 180mg  daily for the spring allergies  Follow up in 2 months for breathing tests

## 2023-01-13 NOTE — Progress Notes (Signed)
Synopsis: Referred in August 2022 for Abnormal MRI  Subjective:   PATIENT ID: Wayne Webb GENDER: male DOB: 15-Feb-1966, MRN: [id number]  HPI  Chief Complaint  Patient presents with   Follow-up    F/U for work. States he is unable to pass the fit test at work. Denies any current breathing issues.    Wayne Webb is a 57 year old male, former smoker with DMII and hyperlipidemia who returns to pulmonary clinic for interstitial lung disease.  He has post-nasal drip. He was recently started on Azelastine nasal spray which does provide relief. He did not like flonase nasal spray due to nose bleeds.   He has to wear respirator at work for emergencies. He is having to be fit tested but has trouble passing the breathing test at work. Overall he denies any issues with dyspnea with his daily activities and denies cough or wheezing. He has lost weight due to working out and maintaining a better diet due to his diabetes.   OV 05/27/2021 He reports having shortness of breath with exertion since March/April this year. He has noticed the dyspnea when working out at the gym and when using the stairs or ladders at work. 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   He does have a cough related to post-nasal drip and clearing his throat. He denies any GERD/Heartburn symptoms. He complains of left sided chest discomfort which is not relived with lidocaine patch. He had issues taking rosuvastatin recently due to myalgias which improved somewhat after being taken off this medication.   He was seen previously by Dr. Melvyn Novas in 2015-2016 for cough which was thought to be related to ACEi and burned out sarcoidosis.   He was evaluated by cardiology on 7/28, note reviewed, and is scheduled for an ECHO, stress test and Cardiac CT Chest.   Past Medical History:  Diagnosis Date   ALLERGIC RHINITIS 03/15/2007   CKD  (chronic kidney disease) stage 3, GFR 30-59 ml/min (HCC)    CRI (chronic renal insufficiency)    CRI, creat 1.4 , renal u/s 04-2011 neg   DIABETES MELLITUS, TYPE II 03/15/2007   Diplopia 12/24/2009   Elevated LFTs    ERECTILE DYSFUNCTION 03/15/2007   GOITER, MULTINODULAR 03/21/2010   HYPERLIPIDEMIA 03/15/2007     Family History  Problem Relation Age of Onset   Heart disease Mother        CABG-onset in her early 41's   Diabetes Mother 55       had DM from her 59's   Hypertension Mother    Prostate cancer Father 56       dx in his 70s   Stroke Neg Hx    Colon cancer Neg Hx      Social History   Socioeconomic History   Marital status: Divorced    Spouse name: Not on file   Number of children: 1   Years of education: Not on file   Highest education level: Not on file  Occupational History   Occupation: active job ([employer])    Comment: Maintenence   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    Household: pt and daughter    Social Determinants of Radio broadcast assistant Strain: Not on file  Food Insecurity: Not on file  Transportation Needs: Not on file  Physical Activity: Not on file  Stress: Not on file  Social Connections: Not on file  Intimate Partner Violence: Not on file     No Known Allergies   Outpatient Medications Prior to Visit  Medication Sig Dispense Refill   Azelastine HCl 137 MCG/SPRAY SOLN INHALE 2 SPRAYS INTO BOTH NOSTRIL AT BEDTIME AS NEEDED FOR RHINITIS OR ALLERGIES 30 mL 5   [place] Oil-Levomenthol  (FDGARD) 25-20.75 MG CAPS Take as directed     Continuous Blood Gluc Sensor (DEXCOM G6 SENSOR) MISC 1 Device by Does not apply route See admin instructions. Change every 10 days 9 each 3   Continuous Blood Gluc Sensor (DEXCOM G6 SENSOR) MISC Change every 10 days. 9 each 2   Continuous Blood Gluc Transmit (DEXCOM G6 TRANSMITTER) MISC Change every 90 days 1 each 2   Dulaglutide (TRULICITY) 1.5 0000000 SOPN Inject 1.5 mg into the skin once a week. 6 mL 3   ezetimibe (ZETIA) 10 MG tablet TAKE 1 TABLET BY MOUTH EVERY DAY 90 tablet 3   insulin glargine (LANTUS) 100 UNIT/ML Solostar Pen Take 70 units at 6 p.m. 90 mL 1   Insulin Pen Needle (B-D UF III MINI PEN NEEDLES) 31G X 5 MM MISC USE AS DIRECTED EVERY DAY 100 each 3   JARDIANCE 10 MG TABS tablet Take 10 mg by mouth daily.     losartan (COZAAR) 50 MG tablet Take 1 tablet (50 mg total) by mouth daily. 90 tablet 1   OVER THE COUNTER MEDICATION Take 4 capsules by mouth daily. Nitric oxide supplement     OVER THE COUNTER MEDICATION Take 1 application by mouth daily as needed. Pre workout vitamin supplement mix     pantoprazole (PROTONIX) 40 MG tablet TAKE 1 TABLET BY MOUTH EVERY DAY 30 tablet 3   polyethylene glycol (MIRALAX) 17 g packet Take 17 g by mouth daily. 14 each 0   pravastatin (PRAVACHOL) 40 MG tablet TAKE 1 TABLET BY MOUTH EVERY DAY IN THE EVENING 90 tablet 2   Protein POWD Take 1 scoop by mouth daily as needed.     sildenafil (REVATIO) 20 MG tablet TAKE 3-4 TABLETS BY MOUTH AT BEDTIME AS NEEDED 90 tablet 0   tirzepatide (MOUNJARO) 5 MG/0.5ML Pen INJECT THE CONTENTS OF ONE PEN  SUBCUTANEOUSLY WEEKLY AS  DIRECTED 6 mL 1   Fluticasone-Umeclidin-Vilant (TRELEGY ELLIPTA) 200-62.5-25 MCG/INH AEPB Inhale 1 puff into the lungs daily. 14 each 0   No facility-administered medications prior to visit.   Review of Systems  Constitutional:  Negative for chills, fever, malaise/fatigue and weight loss.  HENT:  Positive for congestion (post-nasal drip).  Negative for sinus pain and sore throat.   Eyes: Negative.   Respiratory:  Negative for cough, hemoptysis, sputum production, shortness of breath and wheezing.   Cardiovascular:  Positive for chest pain. Negative for palpitations, orthopnea, claudication and leg swelling.  Gastrointestinal:  Negative for abdominal pain, heartburn, nausea and vomiting.  Genitourinary: Negative.  Musculoskeletal:  Negative for joint pain and myalgias.  Skin:  Negative for rash.  Neurological:  Negative for weakness.  Endo/Heme/Allergies: Negative.   Psychiatric/Behavioral: Negative.      Objective:   Vitals:   01/13/23 1035  BP: 116/64  Pulse: 84  SpO2: 97%  Weight: 210 lb (95.3 kg)  Height: 6' (1.829 m)   Physical Exam Constitutional:      General: He is not in acute distress. HENT:     Head: Normocephalic and atraumatic.  Eyes:     Conjunctiva/sclera: Conjunctivae normal.  Cardiovascular:     Rate and Rhythm: Normal rate and regular rhythm.     Pulses: Normal pulses.     Heart sounds: Normal heart sounds. No murmur heard. Pulmonary:     Effort: Pulmonary effort is normal.     Breath sounds: Wheezing (right middle, posteriorly) present. No rhonchi or rales.  Musculoskeletal:     Right lower leg: No edema.     Left lower leg: No edema.  Skin:    General: Skin is warm and dry.  Neurological:     General: No focal deficit present.     Mental Status: He is alert.     CBC    Component Value Date/Time   WBC 4.8 12/28/2022 1136   RBC 3.96 (L) 12/28/2022 1136   HGB 11.8 (L) 12/28/2022 1136   HCT 35.2 (L) 12/28/2022 1136   PLT 173.0 12/28/2022 1136   MCV 88.9 12/28/2022 1136   MCH 29.9 12/18/2020 1101   MCHC 33.5 12/28/2022 1136   RDW 12.9 12/28/2022 1136   LYMPHSABS 0.8 12/28/2022 1136   MONOABS 0.9 12/28/2022 1136   EOSABS 0.4 12/28/2022 1136   BASOSABS 0.0 12/28/2022 1136      Latest Ref Rng & Units 12/28/2022   11:36 AM 12/28/2022    8:43 AM 08/26/2022    8:36 AM  BMP   Glucose 70 - 99 mg/dL 73  78  156   BUN 6 - 23 mg/dL 52   57   Creatinine 0.40 - 1.50 mg/dL 2.69   2.82   Sodium 135 - 145 mEq/L 140   138   Potassium 3.5 - 5.1 mEq/L 4.4   4.0   Chloride 96 - 112 mEq/L 104   103   CO2 19 - 32 mEq/L 26   27   Calcium 8.4 - 10.5 mg/dL 10.6   10.9    Chest imaging: CT Chest 06/20/21 1. There is a spectrum of findings in the lungs considered diagnostic of usual interstitial pneumonia (UIP) per current ATS guidelines, with mild progression compared to remote prior studies from 2016. 2. There is associated mediastinal and bilateral hilar lymphadenopathy which is slightly greater than typically seen in the setting of interstitial lung disease. This fact, the presence of coarse calcifications, and the presence of scattered areas of perilymphatic micro nodularity in the lungs may suggest concurrent process such as sarcoidosis, although the parenchymal lung changes are otherwise not at all typical for sarcoidosis. 3. Right-sided aortic arch (normal anatomical variant) incidentally noted.  MRI Abdomen 05/17/21 Lower chest: Bulky adenopathy in the chest along RIGHT paratracheal chain and in the chest adjacent to the thoracic aorta in the posterior mediastinum. There is a RIGHT-sided aortic arch. Linear opacities are scattered throughout the lung bases and there is no sign of pleural effusion in the chest on limited assessment.  CT Chest 04/10/2015 Mediastinum/Nodes: Bilateral hilar and paratracheal adenopathy with scattered calcifications. An index right paratracheal node measures  3.1 cm in short axis on image 20 of series 2.   There is also right-sided aortic arch. Calcified lower periaortic lymph nodes are present.   Lungs/Pleura: Exaggerated lucency in the lungs suggests emphysema. Coarse interstitial opacity and honeycombing at the lung bases with volume loss in both lower lobes along the hemidiaphragms, and also volume loss and cylindrical  bronchiectasis in the right upper lobe along the minor fissure in the major fissure. There is volume loss in the lingula. The mild nodularity along the fissures  PFT:    Latest Ref Rng & Units 02/19/2015   11:02 AM  PFT Results  FVC-Pre L 2.64   FVC-Predicted Pre % 56   FVC-Post L 2.72   FVC-Predicted Post % 58   Pre FEV1/FVC % % 70   Post FEV1/FCV % % 72   FEV1-Pre L 1.84   FEV1-Predicted Pre % 49   FEV1-Post L 1.95   DLCO uncorrected ml/min/mmHg 19.36   DLCO UNC% % 53   DLVA Predicted % 99   TLC L 3.86   TLC % Predicted % 51   RV % Predicted % 52   PFT 2016: Moderate restriction, Moderate diffusion defect    Assessment & Plan:   ILD (interstitial lung disease) (HCC) - Plan: CT Chest Wo Contrast  Mediastinal adenopathy - Plan: CT Chest Wo Contrast  Discussion: Wayne Webb is a 57 year old male, former smoker with DMII and hyperlipidemia who returns to pulmonary clinic for ILD.   He has known history of mediastinal adenopathy with right lower lobe bronchiectasis and hondeycombing concerning for history of sarcoidosis with no prior biopsies done for confirmation. CT Scans show a UIP pattern.   On prior PFTs in 2016 he had moderate restrictive defect and moderate diffusion defect.   In 2019 he had an serologic inflammatory workup completed which showed ANA 1:80 with a nucleolar pattern. Repeat inflammatory workup in 2022 was showed negative ANA and negative myositis panel. He had elevated CK level.   He overall appears stable compared to before but is having trouble completing his fit test at work. We will trial him on trelegy ellipta sample. We will check a CT Chest scan to monitor his mediastinal adenopathy and ILD features. We will check PFTs.   Follow up in 2 months with PFTs.   Melody Comas, MD [facility] Office: [phone]   Current Outpatient Medications:    Azelastine HCl 137 MCG/SPRAY SOLN, INHALE 2 SPRAYS INTO BOTH NOSTRIL AT  BEDTIME AS NEEDED FOR RHINITIS OR ALLERGIES, Disp: 30 mL, Rfl: 5   [place]way Oil-Levomenthol (FDGARD) 25-20.75 MG CAPS, Take as directed, Disp: , Rfl:    Continuous Blood Gluc Sensor (DEXCOM G6 SENSOR) MISC, 1 Device by Does not apply route See admin instructions. Change every 10 days, Disp: 9 each, Rfl: 3   Continuous Blood Gluc Sensor (DEXCOM G6 SENSOR) MISC, Change every 10 days., Disp: 9 each, Rfl: 2   Continuous Blood Gluc Transmit (DEXCOM G6 TRANSMITTER) MISC, Change every 90 days, Disp: 1 each, Rfl: 2   Dulaglutide (TRULICITY) 1.5 MG/0.5ML SOPN, Inject 1.5 mg into the skin once a week., Disp: 6 mL, Rfl: 3   ezetimibe (ZETIA) 10 MG tablet, TAKE 1 TABLET BY MOUTH EVERY DAY, Disp: 90 tablet, Rfl: 3   insulin glargine (LANTUS) 100 UNIT/ML Solostar Pen, Take 70 units at 6 p.m., Disp: 90 mL, Rfl: 1   Insulin Pen Needle (B-D UF III MINI PEN NEEDLES) 31G X 5 MM MISC,  USE AS DIRECTED EVERY DAY, Disp: 100 each, Rfl: 3   JARDIANCE 10 MG TABS tablet, Take 10 mg by mouth daily., Disp: , Rfl:    losartan (COZAAR) 50 MG tablet, Take 1 tablet (50 mg total) by mouth daily., Disp: 90 tablet, Rfl: 1   OVER THE COUNTER MEDICATION, Take 4 capsules by mouth daily. Nitric oxide supplement, Disp: , Rfl:    OVER THE COUNTER MEDICATION, Take 1 application by mouth daily as needed. Pre workout vitamin supplement mix, Disp: , Rfl:    pantoprazole (PROTONIX) 40 MG tablet, TAKE 1 TABLET BY MOUTH EVERY DAY, Disp: 30 tablet, Rfl: 3   polyethylene glycol (MIRALAX) 17 g packet, Take 17 g by mouth daily., Disp: 14 each, Rfl: 0   pravastatin (PRAVACHOL) 40 MG tablet, TAKE 1 TABLET BY MOUTH EVERY DAY IN THE EVENING, Disp: 90 tablet, Rfl: 2   Protein POWD, Take 1 scoop by mouth daily as needed., Disp: , Rfl:    sildenafil (REVATIO) 20 MG tablet, TAKE 3-4 TABLETS BY MOUTH AT BEDTIME AS NEEDED, Disp: 90 tablet, Rfl: 0   tirzepatide (MOUNJARO) 5 MG/0.5ML Pen, INJECT THE CONTENTS OF ONE PEN  SUBCUTANEOUSLY WEEKLY AS  DIRECTED, Disp: 6  mL, Rfl: 1

## 2023-01-14 ENCOUNTER — Other Ambulatory Visit: Payer: Self-pay | Admitting: Endocrinology

## 2023-01-14 DIAGNOSIS — E1122 Type 2 diabetes mellitus with diabetic chronic kidney disease: Secondary | ICD-10-CM

## 2023-01-19 ENCOUNTER — Encounter: Payer: Self-pay | Admitting: Internal Medicine

## 2023-01-28 ENCOUNTER — Ambulatory Visit (HOSPITAL_COMMUNITY): Payer: 59

## 2023-03-05 ENCOUNTER — Other Ambulatory Visit: Payer: Self-pay | Admitting: Physician Assistant

## 2023-03-05 DIAGNOSIS — R140 Abdominal distension (gaseous): Secondary | ICD-10-CM

## 2023-03-12 ENCOUNTER — Other Ambulatory Visit: Payer: Self-pay | Admitting: Internal Medicine

## 2023-03-21 ENCOUNTER — Other Ambulatory Visit: Payer: Self-pay | Admitting: Endocrinology

## 2023-03-21 DIAGNOSIS — Z794 Long term (current) use of insulin: Secondary | ICD-10-CM

## 2023-03-26 ENCOUNTER — Ambulatory Visit: Payer: 59 | Admitting: Pulmonary Disease

## 2023-03-30 ENCOUNTER — Other Ambulatory Visit: Payer: Self-pay | Admitting: Endocrinology

## 2023-03-30 DIAGNOSIS — Z794 Long term (current) use of insulin: Secondary | ICD-10-CM

## 2023-04-29 ENCOUNTER — Other Ambulatory Visit: Payer: Self-pay | Admitting: Endocrinology

## 2023-04-29 DIAGNOSIS — E1165 Type 2 diabetes mellitus with hyperglycemia: Secondary | ICD-10-CM

## 2023-05-10 ENCOUNTER — Other Ambulatory Visit: Payer: Self-pay | Admitting: Endocrinology

## 2023-05-10 DIAGNOSIS — N1831 Type 2 diabetes mellitus with diabetic chronic kidney disease: Secondary | ICD-10-CM

## 2023-05-14 ENCOUNTER — Ambulatory Visit (INDEPENDENT_AMBULATORY_CARE_PROVIDER_SITE_OTHER): Payer: 59 | Admitting: "Endocrinology

## 2023-05-14 ENCOUNTER — Encounter: Payer: Self-pay | Admitting: "Endocrinology

## 2023-05-14 VITALS — BP 100/70 | HR 75 | Ht 72.0 in | Wt 204.0 lb

## 2023-05-14 DIAGNOSIS — E782 Mixed hyperlipidemia: Secondary | ICD-10-CM

## 2023-05-14 DIAGNOSIS — Z7984 Long term (current) use of oral hypoglycemic drugs: Secondary | ICD-10-CM | POA: Diagnosis not present

## 2023-05-14 DIAGNOSIS — Z794 Long term (current) use of insulin: Secondary | ICD-10-CM

## 2023-05-14 DIAGNOSIS — E1165 Type 2 diabetes mellitus with hyperglycemia: Secondary | ICD-10-CM

## 2023-05-14 DIAGNOSIS — Z7985 Long-term (current) use of injectable non-insulin antidiabetic drugs: Secondary | ICD-10-CM | POA: Diagnosis not present

## 2023-05-14 MED ORDER — TIRZEPATIDE 7.5 MG/0.5ML ~~LOC~~ SOAJ
7.5000 mg | SUBCUTANEOUS | 1 refills | Status: DC
Start: 2023-05-14 — End: 2024-03-14

## 2023-05-14 MED ORDER — EMPAGLIFLOZIN 25 MG PO TABS: 25.0000 mg | ORAL_TABLET | Freq: Every day | ORAL | 1 refills | Status: AC

## 2023-05-14 MED ORDER — GVOKE HYPOPEN 1-PACK 1 MG/0.2ML ~~LOC~~ SOAJ: 1.0000 mg | SUBCUTANEOUS | 2 refills | Status: AC | PRN

## 2023-05-14 NOTE — Patient Instructions (Signed)

## 2023-05-14 NOTE — Progress Notes (Signed)
Outpatient Endocrinology Note Wayne Irena, MD  05/14/23   Wayne Webb 25-Feb-1966 [id number]  Referring Provider: Wanda Plump, MD Primary Care Provider: Wanda Plump, MD Reason for consultation: Subjective   Assessment & Plan  Previn was seen today for diabetes.  Diagnoses and all orders for this visit:  Uncontrolled type 2 diabetes mellitus with hyperglycemia (HCC)  Long term (current) use of oral hypoglycemic drugs  Long-term (current) use of injectable non-insulin antidiabetic drugs  Long-term insulin use (HCC)  Mixed hypercholesterolemia and hypertriglyceridemia  Other orders -     empagliflozin (JARDIANCE) 25 MG TABS tablet; Take 1 tablet (25 mg total) by mouth daily before breakfast. -     tirzepatide (MOUNJARO) 7.5 MG/0.5ML Pen; Inject 7.5 mg into the skin once a week. -     Glucagon (GVOKE HYPOPEN 1-PACK) 1 MG/0.2ML SOAJ; Inject 1 mg into the skin as needed (low blood sugar with impaired consciousness).    Diabetes Type II complicated by neuropathy, nephropathy Lab Results  Component Value Date   GFR 25.24 (L) 12/28/2022   Hba1c goal less than 7, current Hba1c is  Lab Results  Component Value Date   HGBA1C 5.3 12/28/2022   Will recommend the following: Increase Jardiance to 25 mg once a day. Increase Mounjaro to 7.5 mg once a week. Continue Lantus  at 56 every morning, decrease by two units if the blood sugars drop less than 70.  No known contraindications to any of above medications Glucagon ordered with refills on 05/14/23  -Last LD and Tg are as follows: Lab Results  Component Value Date   LDLCALC 69 12/29/2022    Lab Results  Component Value Date   TRIG 139.0 12/29/2022   -On ezetimibe 10 mg and pravastatin 40 mg QD -Follow low fat diet and exercise   -Blood pressure goal <140/90 - Microalbumin/creatinine goal < 30 -Last MA/Cr is as follows: Lab Results  Component Value Date   MICROALBUR 842.6 09/29/2021   -is on ACE/ARB  Losartan 50 mg QD -diet changes including salt restriction -limit eating outside -counseled BP targets per standards of diabetes care -uncontrolled blood pressure can lead to retinopathy, nephropathy and cardiovascular and atherosclerotic heart disease  Reviewed and counseled on: -A1C target -Blood sugar targets -Complications of uncontrolled diabetes  -Checking blood sugar before meals and bedtime and bring log next visit -All medications with mechanism of action and side effects -Hypoglycemia management: rule of 15's, Glucagon Emergency Kit and medical alert ID -low-carb low-fat plate-method diet -At least 20 minutes of physical activity per day -Annual dilated retinal eye exam and foot exam -compliance and follow up needs -follow up as scheduled or earlier if problem gets worse  Call if blood sugar is less than 70 or consistently above 250    Take a 15 gm snack of carbohydrate at bedtime before you go to sleep if your blood sugar is less than 100.    If you are going to fast after midnight for a test or procedure, ask your physician for instructions on how to reduce/decrease your insulin dose.    Call if blood sugar is less than 70 or consistently above 250  -Treating a low sugar by rule of 15  (15 gms of sugar every 15 min until sugar is more than 70) If you feel your sugar is low, test your sugar to be sure If your sugar is low (less than 70), then take 15 grams of a fast acting Carbohydrate (3-4 glucose tablets  or glucose gel or 4 ounces of juice or regular soda) Recheck your sugar 15 min after treating low to make sure it is more than 70 If sugar is still less than 70, treat again with 15 grams of carbohydrate          Don't drive the hour of hypoglycemia  If unconscious/unable to eat or drink by mouth, use glucagon injection or nasal spray baqsimi and call 911. Can repeat again in 15 min if still unconscious.  No follow-ups on file.   I have reviewed current medications,  nurse's notes, allergies, vital signs, past medical and surgical history, family medical history, and social history for this encounter. Counseled patient on symptoms, examination findings, lab findings, imaging results, treatment decisions and monitoring and prognosis. The patient understood the recommendations and agrees with the treatment plan. All questions regarding treatment plan were fully answered.  Wayne Wausa, MD  05/14/23    History of Present Illness Wayne Webb is a 57 y.o. year old male who presents for evaluation of Type II diabetes mellitus.  Home diabetes regimen: Jardiance 10 mg QD. Mounjaro 5 mg weekly. Lantus 56 mg in the morning  COMPLICATIONS -  MI/Stroke -  retinopathy +  neuropathy +  nephropathy  SYMPTOMS REVIEWED - Polyuria - Weight loss - Blurred vision  BLOOD SUGAR DATA Image added CGM interpretation: At today's visit, we reviewed her CGM downloads. The full report is scanned in the media. Reviewing the CGM trends, BG are elevated 3 AM to 5 AM   Physical Exam  BP 100/70   Pulse 75   Ht 6' (1.829 m)   Wt 204 lb (92.5 kg)   SpO2 96%   BMI 27.67 kg/m    Constitutional: well developed, well nourished Head: normocephalic, atraumatic Eyes: sclera anicteric, no redness Neck: supple Lungs: normal respiratory effort Neurology: alert and oriented Skin: dry, no appreciable rashes Musculoskeletal: no appreciable defects Psychiatric: normal mood and affect Diabetic Foot Exam - Simple   No data filed      Current Medications @[facility] @  Allergies No Known Allergies  Past Medical History Past Medical History:  Diagnosis Date   ALLERGIC RHINITIS 03/15/2007   CKD (chronic kidney disease) stage 3, GFR 30-59 ml/min (HCC)    CRI (chronic renal insufficiency)    CRI, creat 1.4 , renal u/s 04-2011 neg   DIABETES MELLITUS, TYPE II 03/15/2007   Diplopia 12/24/2009   Elevated LFTs    ERECTILE DYSFUNCTION 03/15/2007   GOITER, MULTINODULAR  03/21/2010   HYPERLIPIDEMIA 03/15/2007    Past Surgical History Past Surgical History:  Procedure Laterality Date   LASIK Left 10/2012    Family History family history includes Diabetes (age of onset: 59) in his mother; Heart disease in his mother; Hypertension in his mother; Prostate cancer (age of onset: 64) in his father.  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    Household: pt and daughter    Social Determinants of Health   Financial Resource Strain: Not on file  Food Insecurity: Not on file  Transportation Needs: Not on file  Physical Activity: Not on file  Stress: Not on file  Social Connections: Not on file  Intimate Partner Violence: Not on file    Lab Results  Component Value Date   HGBA1C 5.3 12/28/2022   HGBA1C 6.6 (H) 08/26/2022   HGBA1C 6.0 05/15/2022   Lab Results  Component Value Date   CHOL 132 12/29/2022   Lab Results  Component Value Date   HDL 35.60 (L) 12/29/2022   Lab Results  Component Value Date   LDLCALC 69 12/29/2022   Lab Results  Component Value Date   TRIG 139.0 12/29/2022   Lab Results  Component Value Date   CHOLHDL 4 12/29/2022   Lab Results  Component Value Date    CREATININE 2.69 (H) 12/28/2022   Lab Results  Component Value Date   GFR 25.24 (L) 12/28/2022   Lab Results  Component Value Date   MICROALBUR 842.6 09/29/2021      Component Value Date/Time   NA 140 12/28/2022 1136   NA 141 12/22/2021 0803   K 4.4 12/28/2022 1136   CL 104 12/28/2022 1136   CO2 26 12/28/2022 1136   GLUCOSE 73 12/28/2022 1136   BUN 52 (H) 12/28/2022 1136   BUN 40 (H) 12/22/2021 0803   CREATININE 2.69 (H) 12/28/2022 1136   CALCIUM 10.6 (H) 12/28/2022 1136   PROT 7.8 12/28/2022 1136   PROT 7.4 12/22/2021 0803   ALBUMIN 4.1 12/28/2022 1136   ALBUMIN 4.3 12/22/2021 0803   AST 40 (H) 12/28/2022 1136   ALT 42 12/28/2022 1136   ALKPHOS 109 12/28/2022 1136   BILITOT 0.8 12/28/2022 1136   BILITOT 0.7 12/22/2021 0803   GFRNONAA 28 (L) 12/18/2020 1101   GFRAA 46 09/29/2021 0000      Latest Ref Rng & Units 12/28/2022   11:36 AM 12/28/2022    8:43 AM 08/26/2022    8:36 AM  BMP  Glucose 70 - 99 mg/dL 73  78  161   BUN 6 - 23 mg/dL 52   57   Creatinine 0.96 - 1.50 mg/dL 0.45   4.09   Sodium 811 - 145 mEq/L 140   138   Potassium 3.5 - 5.1 mEq/L 4.4   4.0   Chloride 96 - 112 mEq/L 104   103   CO2 19 - 32 mEq/L 26   27   Calcium 8.4 - 10.5 mg/dL 91.4   78.2        Component Value Date/Time   WBC 4.8 12/28/2022 1136   RBC 3.96 (L) 12/28/2022 1136   HGB 11.8 (L) 12/28/2022 1136   HCT 35.2 (L) 12/28/2022 1136   PLT 173.0 12/28/2022 1136   MCV 88.9 12/28/2022 1136   MCH 29.9 12/18/2020 1101   MCHC 33.5 12/28/2022 1136   RDW 12.9 12/28/2022 1136   LYMPHSABS 0.8 12/28/2022 1136   MONOABS 0.9 12/28/2022 1136   EOSABS 0.4 12/28/2022 1136   BASOSABS 0.0 12/28/2022 1136     Parts  of this note may have been dictated using voice recognition software. There may be variances in spelling and vocabulary which are unintentional. Not all errors are proofread. Please notify the There[facility] Parkin if any discrepancies are noted or if the meaning of any statement is not clear.

## 2023-05-17 ENCOUNTER — Encounter: Payer: Self-pay | Admitting: "Endocrinology

## 2023-06-08 ENCOUNTER — Encounter: Payer: Self-pay | Admitting: Pulmonary Disease

## 2023-06-08 ENCOUNTER — Ambulatory Visit (INDEPENDENT_AMBULATORY_CARE_PROVIDER_SITE_OTHER): Payer: 59 | Admitting: Pulmonary Disease

## 2023-06-08 ENCOUNTER — Ambulatory Visit: Payer: 59 | Admitting: Pulmonary Disease

## 2023-06-08 VITALS — BP 110/76 | HR 75 | Temp 97.7°F | Ht 72.0 in | Wt 208.0 lb

## 2023-06-08 DIAGNOSIS — J849 Interstitial pulmonary disease, unspecified: Secondary | ICD-10-CM | POA: Diagnosis not present

## 2023-06-08 DIAGNOSIS — R590 Localized enlarged lymph nodes: Secondary | ICD-10-CM

## 2023-06-08 LAB — PULMONARY FUNCTION TEST
DL/VA % pred: 91 %
DL/VA: 3.84 ml/min/mmHg/L
DLCO cor % pred: 49 %
DLCO cor: 14.66 ml/min/mmHg
DLCO unc % pred: 49 %
DLCO unc: 14.66 ml/min/mmHg
FEF 25-75 Post: 1.26 L/sec
FEF 25-75 Pre: 0.96 L/sec
FEF2575-%Change-Post: 31 %
FEF2575-%Pred-Post: 39 %
FEF2575-%Pred-Pre: 29 %
FEV1-%Change-Post: 7 %
FEV1-%Pred-Post: 45 %
FEV1-%Pred-Pre: 42 %
FEV1-Post: 1.79 L
FEV1-Pre: 1.67 L
FEV1FVC-%Change-Post: 4 %
FEV1FVC-%Pred-Pre: 90 %
FEV6-%Change-Post: 3 %
FEV6-%Pred-Post: 50 %
FEV6-%Pred-Pre: 48 %
FEV6-Post: 2.49 L
FEV6-Pre: 2.4 L
FEV6FVC-%Change-Post: 1 %
FEV6FVC-%Pred-Post: 104 %
FEV6FVC-%Pred-Pre: 103 %
FVC-%Change-Post: 2 %
FVC-%Pred-Post: 48 %
FVC-%Pred-Pre: 47 %
FVC-Post: 2.49 L
FVC-Pre: 2.43 L
Post FEV1/FVC ratio: 72 %
Post FEV6/FVC ratio: 100 %
Pre FEV1/FVC ratio: 69 %
Pre FEV6/FVC Ratio: 99 %
RV % pred: 57 %
RV: 1.33 L
TLC % pred: 59 %
TLC: 4.4 L

## 2023-06-08 NOTE — Progress Notes (Signed)
Full PFT performed today. °

## 2023-06-08 NOTE — Patient Instructions (Signed)
Full PFT performed today. °

## 2023-06-08 NOTE — Progress Notes (Signed)
Synopsis: Referred in August 2022 for Abnormal MRI  Subjective:   PATIENT ID: Wayne Webb GENDER: male DOB: 1966/04/09, MRN: [id number]  HPI  Chief Complaint  Patient presents with   Follow-up    Breathing has been good PFT 06/08/23   Wayne Webb is a 57 year old male, former smoker with DMII and hyperlipidemia who returns to pulmonary clinic for interstitial lung disease.  He has been doing well since last visit.  He has been working out and [employer] routinely.  Shortness of breath does not limit his ability to workout or exercise.  He does report dyspnea if he walks up multiple flights of stairs.  He tried Trelegy at last visit without any improvement in his cough or breathing.  He continues to have postnasal drip in which he uses Astelin as needed.  PFTs today show mixed obstructive and restrictive ventilatory defects with moderate diffusion defect.  No significant bronchodilator response.  OV 01/13/23 He has post-nasal drip. He was recently started on Azelastine nasal spray which does provide relief. He did not like flonase nasal spray due to nose bleeds.   He has to wear respirator at work for emergencies. He is having to be fit tested but has trouble passing the breathing test at work. Overall he denies any issues with dyspnea with his daily activities and denies cough or wheezing. He has lost weight due to working out and maintaining a better diet due to his diabetes.   OV 05/27/2021 He reports having shortness of breath with exertion since March/April this year. He has noticed the dyspnea when working out at the gym and when using the stairs or ladders at work. 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   He does have a cough related to post-nasal drip and clearing his throat. He denies any GERD/Heartburn symptoms. He complains of left sided chest discomfort which is  not relived with lidocaine patch. He had issues taking rosuvastatin recently due to myalgias which improved somewhat after being taken off this medication.   He was seen previously by Dr. Sherene Sires in 2015-2016 for cough which was thought to be related to ACEi and burned out sarcoidosis.   He was evaluated by cardiology on 7/28, note reviewed, and is scheduled for an ECHO, stress test and Cardiac CT Chest.   Past Medical History:  Diagnosis Date   ALLERGIC RHINITIS 03/15/2007   CKD (chronic kidney disease) stage 3, GFR 30-59 ml/min (HCC)    CRI (chronic renal insufficiency)    CRI, creat 1.4 , renal u/s 04-2011 neg   DIABETES MELLITUS, TYPE II 03/15/2007   Diplopia 12/24/2009   Elevated LFTs    ERECTILE DYSFUNCTION 03/15/2007   GOITER, MULTINODULAR 03/21/2010   HYPERLIPIDEMIA 03/15/2007     Family History  Problem Relation Age of Onset   Heart disease Mother        CABG-onset in her early 46's   Diabetes Mother 62       had DM from her 87's   Hypertension Mother    Prostate cancer Father 43       dx in his 49s   Stroke Neg Hx    Colon cancer Neg Hx      Social History   Socioeconomic History   Marital status: Divorced    Spouse name: Not on file   Number of children: 1   Years of education:  Not on file   Highest education level: Not on file  Occupational History   Occupation: active job ([employer])    Comment: Maintenence   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    Household: pt and daughter    Social  Determinants of Corporate investment banker Strain: Not on file  Food Insecurity: Not on file  Transportation Needs: Not on file  Physical Activity: Not on file  Stress: Not on file  Social Connections: Not on file  Intimate Partner Violence: Not on file     No Known Allergies   Outpatient Medications Prior to Visit  Medication Sig Dispense Refill   Azelastine HCl 137 MCG/SPRAY SOLN Place 2 sprays into both nostrils at bedtime as needed. 30 mL 5   Continuous Blood Gluc Sensor (DEXCOM G6 SENSOR) MISC 1 Device by Does not apply route See admin instructions. Change every 10 days 9 each 3   Continuous Glucose Sensor (DEXCOM G6 SENSOR) MISC CHANGE EVERY 10 DAYS 9 each 3   Continuous Glucose Transmitter (DEXCOM G6 TRANSMITTER) MISC CHANGE EVERY 90 DAYS 1 each 2   empagliflozin (JARDIANCE) 25 MG TABS tablet Take 1 tablet (25 mg total) by mouth daily before breakfast. 90 tablet 1   ezetimibe (ZETIA) 10 MG tablet TAKE 1 TABLET BY MOUTH EVERY DAY 90 tablet 3   Glucagon (GVOKE HYPOPEN 1-PACK) 1 MG/0.2ML SOAJ Inject 1 mg into the skin as needed (low blood sugar with impaired consciousness). 0.4 mL 2   insulin glargine (LANTUS SOLOSTAR) 100 UNIT/ML Solostar Pen INJECT SUBCUTANEOUSLY 70 UNITS  AT 6PM 75 mL 3   Insulin Pen Needle (B-D UF III MINI PEN NEEDLES) 31G X 5 MM MISC USE AS DIRECTED EVERY DAY 90 each 3   losartan (COZAAR) 50 MG tablet Take 1 tablet (50 mg total) by mouth daily. 90 tablet 1   OVER THE COUNTER MEDICATION Take 4 capsules by mouth daily. Nitric oxide supplement     OVER THE COUNTER MEDICATION Take 1 application by mouth daily as needed. Pre workout vitamin supplement mix     pantoprazole (PROTONIX) 40 MG tablet TAKE 1 TABLET BY MOUTH EVERY DAY 30 tablet 3   polyethylene glycol (MIRALAX) 17 g packet Take 17 g by mouth daily. 14 each 0   pravastatin (PRAVACHOL) 40 MG tablet TAKE 1 TABLET BY MOUTH EVERY DAY IN THE EVENING 90 tablet 2   Protein POWD Take 1 scoop by mouth daily as  needed.     sildenafil (REVATIO) 20 MG tablet TAKE 3-4 TABLETS BY MOUTH AT BEDTIME AS NEEDED 90 tablet 0   tirzepatide (MOUNJARO) 7.5 MG/0.5ML Pen Inject 7.5 mg into the skin once a week. 6 mL 1   Continuous Blood Gluc Sensor (DEXCOM G6 SENSOR) MISC Change every 10 days. 9 each 2   Fluticasone-Umeclidin-Vilant (TRELEGY ELLIPTA) 100-62.5-25 MCG/ACT AEPB Inhale 1 puff into the lungs  daily. (Patient not taking: Reported on 06/08/2023) 1 each 0   Caraway Oil-Levomenthol (FDGARD) 25-20.75 MG CAPS Take as directed     No facility-administered medications prior to visit.   Review of Systems  Constitutional:  Negative for chills, fever, malaise/fatigue and weight loss.  HENT:  Positive for congestion (post-nasal drip). Negative for sinus pain and sore throat.   Eyes: Negative.   Respiratory:  Positive for cough. Negative for hemoptysis, sputum production, shortness of breath and wheezing.   Cardiovascular:  Negative for chest pain, palpitations, orthopnea, claudication and leg swelling.  Gastrointestinal:  Negative for abdominal pain, heartburn, nausea and vomiting.  Genitourinary: Negative.   Musculoskeletal:  Negative for joint pain and myalgias.  Skin:  Negative for rash.  Neurological:  Negative for weakness.  Endo/Heme/Allergies: Negative.   Psychiatric/Behavioral: Negative.      Objective:   Vitals:   06/08/23 0957  BP: 110/76  Pulse: 75  Temp: 97.7 F (36.5 C)  TempSrc: Temporal  SpO2: 100%  Weight: 208 lb (94.3 kg)  Height: 6' (1.829 m)    Physical Exam Constitutional:      General: He is not in acute distress. HENT:     Head: Normocephalic and atraumatic.  Eyes:     Conjunctiva/sclera: Conjunctivae normal.  Cardiovascular:     Rate and Rhythm: Normal rate and regular rhythm.     Pulses: Normal pulses.     Heart sounds: Normal heart sounds. No murmur heard. Pulmonary:     Effort: Pulmonary effort is normal.     Breath sounds: No wheezing, rhonchi or rales.   Musculoskeletal:     Right lower leg: No edema.     Left lower leg: No edema.  Skin:    General: Skin is warm and dry.  Neurological:     General: No focal deficit present.     Mental Status: He is alert.     CBC    Component Value Date/Time   WBC 4.8 12/28/2022 1136   RBC 3.96 (L) 12/28/2022 1136   HGB 11.8 (L) 12/28/2022 1136   HCT 35.2 (L) 12/28/2022 1136   PLT 173.0 12/28/2022 1136   MCV 88.9 12/28/2022 1136   MCH 29.9 12/18/2020 1101   MCHC 33.5 12/28/2022 1136   RDW 12.9 12/28/2022 1136   LYMPHSABS 0.8 12/28/2022 1136   MONOABS 0.9 12/28/2022 1136   EOSABS 0.4 12/28/2022 1136   BASOSABS 0.0 12/28/2022 1136      Latest Ref Rng & Units 12/28/2022   11:36 AM 12/28/2022    8:43 AM 08/26/2022    8:36 AM  BMP  Glucose 70 - 99 mg/dL 73  78  176   BUN 6 - 23 mg/dL 52   57   Creatinine 1.60 - 1.50 mg/dL 7.37   1.06   Sodium 269 - 145 mEq/L 140   138   Potassium 3.5 - 5.1 mEq/L 4.4   4.0   Chloride 96 - 112 mEq/L 104   103   CO2 19 - 32 mEq/L 26   27   Calcium 8.4 - 10.5 mg/dL 48.5   46.2    Chest imaging: CT Chest 06/20/21 1. There is a spectrum of findings in the lungs considered diagnostic of usual interstitial pneumonia (UIP) per current ATS guidelines, with mild progression compared to remote prior studies from 2016. 2. There is associated mediastinal and bilateral hilar lymphadenopathy which is slightly greater than typically seen in the setting of interstitial lung disease. This fact, the presence of  coarse calcifications, and the presence of scattered areas of perilymphatic micro nodularity in the lungs may suggest concurrent process such as sarcoidosis, although the parenchymal lung changes are otherwise not at all typical for sarcoidosis. 3. Right-sided aortic arch (normal anatomical variant) incidentally noted.  MRI Abdomen 05/17/21 Lower chest: Bulky adenopathy in the chest along RIGHT paratracheal chain and in the chest adjacent to the thoracic aorta  in the posterior mediastinum. There is a RIGHT-sided aortic arch. Linear opacities are scattered throughout the lung bases and there is no sign of pleural effusion in the chest on limited assessment.  CT Chest 04/10/2015 Mediastinum/Nodes: Bilateral hilar and paratracheal adenopathy with scattered calcifications. An index right paratracheal node measures 3.1 cm in short axis on image 20 of series 2.   There is also right-sided aortic arch. Calcified lower periaortic lymph nodes are present.   Lungs/Pleura: Exaggerated lucency in the lungs suggests emphysema. Coarse interstitial opacity and honeycombing at the lung bases with volume loss in both lower lobes along the hemidiaphragms, and also volume loss and cylindrical bronchiectasis in the right upper lobe along the minor fissure in the major fissure. There is volume loss in the lingula. The mild nodularity along the fissures  PFT:    Latest Ref Rng & Units 06/08/2023    8:47 AM 02/19/2015   11:02 AM  PFT Results  FVC-Pre L 2.43  P 2.64   FVC-Predicted Pre % 47  P 56   FVC-Post L 2.49  P 2.72   FVC-Predicted Post % 48  P 58   Pre FEV1/FVC % % 69  P 70   Post FEV1/FCV % % 72  P 72   FEV1-Pre L 1.67  P 1.84   FEV1-Predicted Pre % 42  P 49   FEV1-Post L 1.79  P 1.95   DLCO uncorrected ml/min/mmHg 14.66  P 19.36   DLCO UNC% % 49  P 53   DLCO corrected ml/min/mmHg 14.66  P   DLCO COR %Predicted % 49  P   DLVA Predicted % 91  P 99   TLC L 4.40  P 3.86   TLC % Predicted % 59  P 51   RV % Predicted % 57  P 52     P Preliminary result  PFT 2016: Moderate restriction, Moderate diffusion defect    Assessment & Plan:   ILD (interstitial lung disease) (HCC)  Mediastinal adenopathy  Discussion: Wayne Webb is a 57 year old male, former smoker with DMII and hyperlipidemia who returns to pulmonary clinic for ILD.   He has known history of mediastinal adenopathy with right lower lobe bronchiectasis and hondeycombing concerning  for history of sarcoidosis with no prior biopsies done for confirmation. CT Scans show a UIP pattern.   On prior PFTs in 2016 he had moderate restrictive defect and moderate diffusion defect.  PFTs today show again moderate restrictive defect and moderate diffusion defect with concern for obstructive component as well.  FVC, FEV1, TLC and DLCO have all declined minimally.  In 2019 he had an serologic inflammatory workup completed which showed ANA 1:80 with a nucleolar pattern. Repeat inflammatory workup in 2022 was showed negative ANA and negative myositis panel. He had elevated CK level.   He overall appears stable.  He has ongoing cough which she feels is mostly due to postnasal drainage and is using as needed Astelin nasal spray.  He was scheduled for high-resolution CT chest scan but due to his high deductible plan and out-of-pocket cost he  canceled the originally scheduled study.  I have verified with our scheduling team that he will incur an out-of-pocket expense for this exam due to his insurance plan the patient has been notified of this.  We hope to reschedule this in order to monitor his ILD findings.  Follow-up in 6 months.  Melody Comas, MD [facility] Office: [phone]   Current Outpatient Medications:    Azelastine HCl 137 MCG/SPRAY SOLN, Place 2 sprays into both nostrils at bedtime as needed., Disp: 30 mL, Rfl: 5   Continuous Blood Gluc Sensor (DEXCOM G6 SENSOR) MISC, 1 Device by Does not apply route See admin instructions. Change every 10 days, Disp: 9 each, Rfl: 3   Continuous Glucose Sensor (DEXCOM G6 SENSOR) MISC, CHANGE EVERY 10 DAYS, Disp: 9 each, Rfl: 3   Continuous Glucose Transmitter (DEXCOM G6 TRANSMITTER) MISC, CHANGE EVERY 90 DAYS, Disp: 1 each, Rfl: 2   empagliflozin (JARDIANCE) 25 MG TABS tablet, Take 1 tablet (25 mg total) by mouth daily before breakfast., Disp: 90 tablet, Rfl: 1   ezetimibe (ZETIA) 10 MG tablet, TAKE 1 TABLET BY MOUTH  EVERY DAY, Disp: 90 tablet, Rfl: 3   Glucagon (GVOKE HYPOPEN 1-PACK) 1 MG/0.2ML SOAJ, Inject 1 mg into the skin as needed (low blood sugar with impaired consciousness)., Disp: 0.4 mL, Rfl: 2   insulin glargine (LANTUS SOLOSTAR) 100 UNIT/ML Solostar Pen, INJECT SUBCUTANEOUSLY 70 UNITS  AT 6PM, Disp: 75 mL, Rfl: 3   Insulin Pen Needle (B-D UF III MINI PEN NEEDLES) 31G X 5 MM MISC, USE AS DIRECTED EVERY DAY, Disp: 90 each, Rfl: 3   losartan (COZAAR) 50 MG tablet, Take 1 tablet (50 mg total) by mouth daily., Disp: 90 tablet, Rfl: 1   OVER THE COUNTER MEDICATION, Take 4 capsules by mouth daily. Nitric oxide supplement, Disp: , Rfl:    OVER THE COUNTER MEDICATION, Take 1 application by mouth daily as needed. Pre workout vitamin supplement mix, Disp: , Rfl:    pantoprazole (PROTONIX) 40 MG tablet, TAKE 1 TABLET BY MOUTH EVERY DAY, Disp: 30 tablet, Rfl: 3   polyethylene glycol (MIRALAX) 17 g packet, Take 17 g by mouth daily., Disp: 14 each, Rfl: 0   pravastatin (PRAVACHOL) 40 MG tablet, TAKE 1 TABLET BY MOUTH EVERY DAY IN THE EVENING, Disp: 90 tablet, Rfl: 2   Protein POWD, Take 1 scoop by mouth daily as needed., Disp: , Rfl:    sildenafil (REVATIO) 20 MG tablet, TAKE 3-4 TABLETS BY MOUTH AT BEDTIME AS NEEDED, Disp: 90 tablet, Rfl: 0   tirzepatide (MOUNJARO) 7.5 MG/0.5ML Pen, Inject 7.5 mg into the skin once a week., Disp: 6 mL, Rfl: 1   Fluticasone-Umeclidin-Vilant (TRELEGY ELLIPTA) 100-62.5-25 MCG/ACT AEPB, Inhale 1 puff into the lungs daily. (Patient not taking: Reported on 06/08/2023), Disp: 1 each, Rfl: 0

## 2023-06-08 NOTE — Patient Instructions (Addendum)
We will check with our schedulers about the cost of the high resolution CT Chest scan  Your breathing tests show a slight decline compared to 2016  Follow up in 6 months

## 2023-06-10 ENCOUNTER — Encounter (INDEPENDENT_AMBULATORY_CARE_PROVIDER_SITE_OTHER): Payer: Self-pay

## 2023-06-16 ENCOUNTER — Encounter: Payer: Self-pay | Admitting: Pulmonary Disease

## 2023-06-30 ENCOUNTER — Encounter: Payer: Self-pay | Admitting: Internal Medicine

## 2023-06-30 ENCOUNTER — Ambulatory Visit (INDEPENDENT_AMBULATORY_CARE_PROVIDER_SITE_OTHER): Payer: 59 | Admitting: Internal Medicine

## 2023-06-30 VITALS — BP 126/64 | HR 73 | Temp 98.4°F | Resp 16 | Ht 72.0 in | Wt 204.5 lb

## 2023-06-30 DIAGNOSIS — N1831 Chronic kidney disease, stage 3a: Secondary | ICD-10-CM

## 2023-06-30 DIAGNOSIS — E1122 Type 2 diabetes mellitus with diabetic chronic kidney disease: Secondary | ICD-10-CM

## 2023-06-30 DIAGNOSIS — Z794 Long term (current) use of insulin: Secondary | ICD-10-CM

## 2023-06-30 DIAGNOSIS — N189 Chronic kidney disease, unspecified: Secondary | ICD-10-CM

## 2023-06-30 DIAGNOSIS — Z23 Encounter for immunization: Secondary | ICD-10-CM

## 2023-06-30 DIAGNOSIS — E785 Hyperlipidemia, unspecified: Secondary | ICD-10-CM | POA: Diagnosis not present

## 2023-06-30 LAB — LIPID PANEL
Cholesterol: 168 mg/dL (ref 0–200)
HDL: 35.1 mg/dL — ABNORMAL LOW (ref >39.00)
LDL Cholesterol: 99 mg/dL (ref 0–99)
NonHDL: 133.14
Total CHOL/HDL Ratio: 5
Triglycerides: 170 mg/dL — ABNORMAL HIGH (ref 0.0–149.0)
VLDL: 34 mg/dL (ref 0.0–40.0)

## 2023-06-30 LAB — BASIC METABOLIC PANEL
BUN: 41 mg/dL — ABNORMAL HIGH (ref 6–23)
CO2: 30 meq/L (ref 19–32)
Calcium: 10.1 mg/dL (ref 8.4–10.5)
Chloride: 104 meq/L (ref 96–112)
Creatinine, Ser: 2.43 mg/dL — ABNORMAL HIGH (ref 0.40–1.50)
GFR: 28.42 mL/min — ABNORMAL LOW (ref >60.00)
Glucose, Bld: 105 mg/dL — ABNORMAL HIGH (ref 70–99)
Potassium: 4 meq/L (ref 3.5–5.1)
Sodium: 142 meq/L (ref 135–145)

## 2023-06-30 NOTE — Progress Notes (Signed)
Subjective:    Patient ID: Wayne Webb, male    DOB: [birth year], 57 y.o.   MRN: [id number]  DOS:  06/30/2023 Type of visit - description: f/u  Routine follow-up, in general feeling well. Saw pulmonary, note reviewed.  DM well-controlled, per endo-.  No recent ambulatory BPs. Denies any lower extremity paresthesias.  Review of Systems See above   Past Medical History:  Diagnosis Date   ALLERGIC RHINITIS 03/15/2007   CKD (chronic kidney disease) stage 3, GFR 30-59 ml/min (HCC)    CRI (chronic renal insufficiency)    CRI, creat 1.4 , renal u/s 04-2011 neg   DIABETES MELLITUS, TYPE II 03/15/2007   Diplopia 12/24/2009   Elevated LFTs    ERECTILE DYSFUNCTION 03/15/2007   GOITER, MULTINODULAR 03/21/2010   HYPERLIPIDEMIA 03/15/2007    Past Surgical History:  Procedure Laterality Date   LASIK Left 10/2012    Current Outpatient Medications  Medication Instructions   Azelastine HCl 137 MCG/SPRAY SOLN 2 sprays, Each Nare, At bedtime PRN   Continuous Blood Gluc Sensor (DEXCOM G6 SENSOR) MISC 1 Device, Does not apply, See admin instructions, Change every 10 days   Continuous Glucose Sensor (DEXCOM G6 SENSOR) MISC CHANGE EVERY 10 DAYS   Continuous Glucose Transmitter (DEXCOM G6 TRANSMITTER) MISC CHANGE EVERY 90 DAYS   empagliflozin (JARDIANCE) 25 mg, Oral, Daily before breakfast   ezetimibe (ZETIA) 10 MG tablet TAKE 1 TABLET BY MOUTH EVERY DAY   Fluticasone-Umeclidin-Vilant (TRELEGY ELLIPTA) 100-62.5-25 MCG/ACT AEPB 1 puff, Inhalation, Daily   Gvoke HypoPen 1-Pack 1 mg, Subcutaneous, As needed   insulin glargine (LANTUS SOLOSTAR) 100 UNIT/ML Solostar Pen INJECT SUBCUTANEOUSLY 70 UNITS  AT 6PM   Insulin Pen Needle (B-D UF III MINI PEN NEEDLES) 31G X 5 MM MISC USE AS DIRECTED EVERY DAY   losartan (COZAAR) 50 mg, Oral, Daily   OVER THE COUNTER MEDICATION 4 capsules, Oral, Daily, Nitric oxide supplement    OVER THE COUNTER MEDICATION 1 application , Oral, Daily PRN, Pre workout  vitamin supplement mix    pantoprazole (PROTONIX) 40 mg, Oral, Daily   polyethylene glycol (MIRALAX) 17 g, Oral, Daily   pravastatin (PRAVACHOL) 40 MG tablet TAKE 1 TABLET BY MOUTH EVERY DAY IN THE EVENING   Protein POWD 1 scoop, Oral, Daily PRN   sildenafil (REVATIO) 20 MG tablet TAKE 3-4 TABLETS BY MOUTH AT BEDTIME AS NEEDED   tirzepatide (MOUNJARO) 7.5 mg, Subcutaneous, Weekly       Objective:   Physical Exam BP 126/64   Pulse 73   Temp 98.4 F (36.9 C) (Oral)   Resp 16   Ht 6' (1.829 m)   Wt 204 lb 8 oz (92.8 kg)   SpO2 95%   BMI 27.74 kg/m  General:   Well developed, NAD, BMI noted. HEENT:  Normocephalic . Face symmetric, atraumatic Lungs:  CTA B Normal respiratory effort, no intercostal retractions, no accessory muscle use. Heart: RRR,  no murmur.  DM foot exam: Good pedal pulses, pinprick examination normal Skin: Not pale. Not jaundice Neurologic:  alert & oriented X3.  Speech normal, gait appropriate for age and unassisted Psych--  Cognition and judgment appear intact.  Cooperative with normal attention span and concentration.  Behavior appropriate. No anxious or depressed appearing.      Assessment     Assessment DM - dr Everardo All Hyperlipidemia (h/o increased LFTs w/ simva 2013) Goiter -- per dr Everardo All, last [place] 06/2016 stable CKD - renal US (-) 2012 ; not f/u  by nephrology as  off 09/2018 ED Elevated LFTs Ultrasound 2015 negative,  hepatitis serologies (-) 01-2015: ferritin (-) previously slt elevated; wnl  transferrin saturation, ANA, ceruloplasmin, anti smooth muscles ab. Alpha 1 antitrypsin.  Saw GI 10/2017: U/S wnl, labs borderline abnormal was RX a liver Bx Pulmonary: --[facility] --Chronic Cough, better after ACEi d/c . Dr. Sherene Sires -- Abnormal lung imaging: dx, Burnout sarcoidosis, then possibly interstitial pneumonia CV:  Dr Odis Hollingshead     -Cardiomyopathy: saw cards d/t CP, w/u 7- 2022: Coronary calcium score 0, stress test low risk, echo: 35 to 40%.        PLAN: DM: Per endocrinology.  Feet exam negative. Hyperlipidemia: On Zetia, Pravachol, check FLP. CKD: Sees nephrology regularly, last BMP about 6 months ago.  Check BMP. ILD: Per pulmonary, LOV last month, felt to be stable.  Has not been using Trelegy but requested a refill.  Will do, to use as needed. Vaccine advice provided. RTC 6 months CPX

## 2023-06-30 NOTE — Assessment & Plan Note (Signed)
DM: Per endocrinology.  Feet exam negative. Hyperlipidemia: On Zetia, Pravachol, check FLP. CKD: Sees nephrology regularly, last BMP about 6 months ago.  Check BMP. ILD: Per pulmonary, LOV last month, felt to be stable.  Has not been using Trelegy but requested a refill.  Will do, to use as needed. Vaccine advice provided. RTC 6 months CPX

## 2023-06-30 NOTE — Patient Instructions (Addendum)
Vaccines I recommend: Covid booster-new this fall    GO TO THE LAB : Get the blood work     GO TO THE FRONT DESK, PLEASE SCHEDULE YOUR APPOINTMENTS Come back for a physical exam in 6 months    Per our records you are soon due for your diabetic eye exam. Please contact your eye doctor to schedule an appointment. Please have them send copies of your office visit notes to [place]. Our fax number is [phone]. If you need a referral to an eye doctor please let us know.

## 2023-07-03 ENCOUNTER — Other Ambulatory Visit: Payer: Self-pay | Admitting: Internal Medicine

## 2023-07-03 ENCOUNTER — Other Ambulatory Visit: Payer: Self-pay | Admitting: Physician Assistant

## 2023-07-03 DIAGNOSIS — R140 Abdominal distension (gaseous): Secondary | ICD-10-CM

## 2023-07-05 ENCOUNTER — Other Ambulatory Visit: Payer: Self-pay | Admitting: Internal Medicine

## 2023-07-06 ENCOUNTER — Encounter: Payer: Self-pay | Admitting: Internal Medicine

## 2023-07-09 ENCOUNTER — Other Ambulatory Visit: Payer: Self-pay

## 2023-07-14 LAB — MICROALBUMIN / CREATININE URINE RATIO: Microalb Creat Ratio: 37

## 2023-07-14 LAB — BASIC METABOLIC PANEL
BUN: 59 — AB (ref 4–21)
CO2: 28 — AB (ref 13–22)
Chloride: 107 (ref 99–108)
Creatinine: 2.8 — AB (ref 0.6–1.3)
Glucose: 118
Potassium: 4.6 mEq/L (ref 3.5–5.1)
Sodium: 140 (ref 137–147)

## 2023-07-14 LAB — PROTEIN / CREATININE RATIO, URINE
Albumin, U: 62.5
Creatinine, Urine: 169.4

## 2023-07-14 LAB — COMPREHENSIVE METABOLIC PANEL
Albumin: 4.2 (ref 3.5–5.0)
Calcium: 10.6 (ref 8.7–10.7)
eGFR: 25

## 2023-07-14 LAB — CBC AND DIFFERENTIAL: Hemoglobin: 11.8 — AB (ref 13.5–17.5)

## 2023-07-20 ENCOUNTER — Encounter: Payer: Self-pay | Admitting: Internal Medicine

## 2023-08-19 LAB — HM DIABETES EYE EXAM

## 2023-09-14 ENCOUNTER — Encounter: Payer: Self-pay | Admitting: "Endocrinology

## 2023-09-14 ENCOUNTER — Ambulatory Visit (INDEPENDENT_AMBULATORY_CARE_PROVIDER_SITE_OTHER): Payer: 59 | Admitting: "Endocrinology

## 2023-09-14 VITALS — BP 122/80 | HR 73 | Ht 72.0 in | Wt 209.6 lb

## 2023-09-14 DIAGNOSIS — Z7985 Long-term (current) use of injectable non-insulin antidiabetic drugs: Secondary | ICD-10-CM

## 2023-09-14 DIAGNOSIS — Z7984 Long term (current) use of oral hypoglycemic drugs: Secondary | ICD-10-CM | POA: Diagnosis not present

## 2023-09-14 DIAGNOSIS — E1140 Type 2 diabetes mellitus with diabetic neuropathy, unspecified: Secondary | ICD-10-CM

## 2023-09-14 DIAGNOSIS — E782 Mixed hyperlipidemia: Secondary | ICD-10-CM | POA: Diagnosis not present

## 2023-09-14 DIAGNOSIS — E1165 Type 2 diabetes mellitus with hyperglycemia: Secondary | ICD-10-CM

## 2023-09-14 DIAGNOSIS — Z794 Long term (current) use of insulin: Secondary | ICD-10-CM | POA: Diagnosis not present

## 2023-09-14 LAB — POCT GLYCOSYLATED HEMOGLOBIN (HGB A1C): Hemoglobin A1C: 6.2 % — AB (ref 4.0–5.6)

## 2023-09-14 NOTE — Progress Notes (Signed)
Outpatient Endocrinology Note [place] , MD  09/14/23   Wayne Webb Apr 18, 1966 [id number]  Referring Provider: Wanda Plump, MD Primary Care Provider: Wanda Plump, MD Reason for consultation: Subjective   Assessment & Plan  Diagnoses and all orders for this visit:  Type 2 diabetes mellitus with diabetic neuropathy, with long-term current use of insulin (HCC) -     POCT glycosylated hemoglobin (Hb A1C) -     Lipid panel; Future -     Microalbumin / creatinine urine ratio; Future -     Comprehensive metabolic panel; Future  Long term (current) use of oral hypoglycemic drugs  Long-term (current) use of injectable non-insulin antidiabetic drugs  Mixed hypercholesterolemia and hypertriglyceridemia   Diabetes Type II complicated by neuropathy, nephropathy Lab Results  Component Value Date   GFR 28.42 (L) 06/30/2023   Hba1c goal less than 7, current Hba1c is  Lab Results  Component Value Date   HGBA1C 6.2 (A) 09/14/2023   Will recommend the following: Jardiance to 25 mg once a day. Mounjaro to 7.5 mg once a week (not keen on further weight loss/dose increase) Lantus at 48 every morning  No known contraindications to any of above medications Glucagon ordered with refills on 05/14/23  -Last LD and Tg are as follows: Lab Results  Component Value Date   LDLCALC 99 06/30/2023    Lab Results  Component Value Date   TRIG 170.0 (H) 06/30/2023   -On pravastatin 40 mg QD -Follow low fat diet and exercise   -Blood pressure goal <140/90 - Microalbumin/creatinine goal < 30 -Last MA/Cr is as follows: Lab Results  Component Value Date   MICROALBUR 842.6 09/29/2021   -is on ACE/ARB Losartan 50 mg QD -diet changes including salt restriction -limit eating outside -counseled BP targets per standards of diabetes care -uncontrolled blood pressure can lead to retinopathy, nephropathy and cardiovascular and atherosclerotic heart disease  Reviewed and counseled  on: -A1C target -Blood sugar targets -Complications of uncontrolled diabetes  -Checking blood sugar before meals and bedtime and bring log next visit -All medications with mechanism of action and side effects -Hypoglycemia management: rule of 15's, Glucagon Emergency Kit and medical alert ID -low-carb low-fat plate-method diet -At least 20 minutes of physical activity per day -Annual dilated retinal eye exam and foot exam -compliance and follow up needs -follow up as scheduled or earlier if problem gets worse  Call if blood sugar is less than 70 or consistently above 250    Take a 15 gm snack of carbohydrate at bedtime before you go to sleep if your blood sugar is less than 100.    If you are going to fast after midnight for a test or procedure, ask your physician for instructions on how to reduce/decrease your insulin dose.    Call if blood sugar is less than 70 or consistently above 250  -Treating a low sugar by rule of 15  (15 gms of sugar every 15 min until sugar is more than 70) If you feel your sugar is low, test your sugar to be sure If your sugar is low (less than 70), then take 15 grams of a fast acting Carbohydrate (3-4 glucose tablets or glucose gel or 4 ounces of juice or regular soda) Recheck your sugar 15 min after treating low to make sure it is more than 70 If sugar is still less than 70, treat again with 15 grams of carbohydrate  Don't drive the hour of hypoglycemia  If unconscious/unable to eat or drink by mouth, use glucagon injection or nasal spray baqsimi and call 911. Can repeat again in 15 min if still unconscious.  Return in about 3 months (around 12/15/2023) for visit and 8 am labs before next visit.   I have reviewed current medications, nurse's notes, allergies, vital signs, past medical and surgical history, family medical history, and social history for this encounter. Counseled patient on symptoms, examination findings, lab findings, imaging  results, treatment decisions and monitoring and prognosis. The patient understood the recommendations and agrees with the treatment plan. All questions regarding treatment plan were fully answered.  [name], MD  09/14/23    History of Present Illness Wayne Webb is a 57 y.o. year old male who presents for follow up of Type II diabetes mellitus.  Home diabetes regimen: Jardiance 25 mg QD Mounjaro 7.5 mg weekly. Lantus 48 mg in the morning  COMPLICATIONS -  MI/Stroke -  retinopathy +  neuropathy +  nephropathy  BLOOD SUGAR DATA  CGM interpretation: At today's visit, we reviewed her CGM downloads. The full report is scanned in the media. Reviewing the CGM trends, BG are well controlled across the day.  Physical Exam  BP 122/80   Pulse 73   Ht 6' (1.829 m)   Wt 209 lb 9.6 oz (95.1 kg)   SpO2 98%   BMI 28.43 kg/m    Constitutional: well developed, well nourished Head: normocephalic, atraumatic Eyes: sclera anicteric, no redness Neck: supple Lungs: normal respiratory effort Neurology: alert and oriented Skin: dry, no appreciable rashes Musculoskeletal: no appreciable defects Psychiatric: normal mood and affect Diabetic Foot Exam - Simple   No data filed      Current Medications @[facility] @  Allergies No Known Allergies  Past Medical History Past Medical History:  Diagnosis Date   ALLERGIC RHINITIS 03/15/2007   CKD (chronic kidney disease) stage 3, GFR 30-59 ml/min (HCC)    CRI (chronic renal insufficiency)    CRI, creat 1.4 , renal u/s 04-2011 neg   DIABETES MELLITUS, TYPE II 03/15/2007   Diplopia 12/24/2009   Elevated LFTs    ERECTILE DYSFUNCTION 03/15/2007   GOITER, MULTINODULAR 03/21/2010   HYPERLIPIDEMIA 03/15/2007    Past Surgical History Past Surgical History:  Procedure Laterality Date   LASIK Left 10/2012    Family History family history includes Diabetes (age of onset: 53) in his mother; Heart disease in his mother; Hypertension  in his mother; Prostate cancer (age of onset: 25) in his father.  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    Household: pt and daughter    Social Determinants of Health   Financial Resource Strain: Not on file  Food Insecurity: Not on file  Transportation Needs: Not on file  Physical Activity: Not on file  Stress: Not on file  Social Connections: Not on file  Intimate Partner Violence: Not on file    Lab Results  Component Value Date   HGBA1C 6.2 (A) 09/14/2023   HGBA1C 5.3 12/28/2022   HGBA1C 6.6 (H) 08/26/2022   Lab Results  Component Value Date   CHOL 168 06/30/2023   Lab Results  Component Value Date   HDL 35.10 (L) 06/30/2023   Lab Results  Component Value Date   LDLCALC 99 06/30/2023   Lab Results  Component Value Date   TRIG 170.0 (H) 06/30/2023   Lab Results  Component Value Date   CHOLHDL 5 06/30/2023   Lab Results  Component Value Date   CREATININE 2.8 (A) 07/14/2023   Lab Results  Component Value Date   GFR 28.42 (L) 06/30/2023   Lab Results  Component Value Date   MICROALBUR 842.6 09/29/2021      Component Value Date/Time   NA 140 07/14/2023 0000   K 4.6 07/14/2023 0000   CL 107  07/14/2023 0000   CO2 28 (A) 07/14/2023 0000   GLUCOSE 105 (H) 06/30/2023 0832   BUN 59 (A) 07/14/2023 0000   CREATININE 2.8 (A) 07/14/2023 0000   CREATININE 2.43 (H) 06/30/2023 0832   CALCIUM 10.6 07/14/2023 0000   PROT 7.8 12/28/2022 1136   PROT 7.4 12/22/2021 0803   ALBUMIN 4.2 07/14/2023 0000   ALBUMIN 4.3 12/22/2021 0803   AST 40 (H) 12/28/2022 1136   ALT 42 12/28/2022 1136   ALKPHOS 109 12/28/2022 1136   BILITOT 0.8 12/28/2022 1136   BILITOT 0.7 12/22/2021 0803   GFRNONAA 28 (L) 12/18/2020 1101   GFRAA 46 09/29/2021 0000      Latest Ref Rng & Units 07/14/2023   12:00 AM 06/30/2023    8:32 AM 12/28/2022   11:36 AM  BMP  Glucose 70 - 99 mg/dL  098  73   BUN 4 - 21 59     41  52   Creatinine 0.6 - 1.3 2.8     2.43  2.69   Sodium 137 - 147 140     142  140   Potassium 3.5 - 5.1 mEq/L 4.6     4.0  4.4   Chloride 99 - 108 107     104  104   CO2 13 - 22 28     30  26    Calcium 8.7 - 10.7 10.6     10.1  10.6      This result is from an external source.       Component Value Date/Time   WBC 4.8 12/28/2022 1136   RBC 3.96 (L) 12/28/2022 1136   HGB 11.8 (A) 07/14/2023 0000   HCT 35.2 (L) 12/28/2022 1136   PLT 173.0 12/28/2022 1136   MCV 88.9 12/28/2022 1136   MCH 29.9 12/18/2020 1101   MCHC 33.5 12/28/2022 1136   RDW 12.9 12/28/2022 1136   LYMPHSABS 0.8 12/28/2022 1136   MONOABS 0.9 12/28/2022 1136   EOSABS 0.4 12/28/2022 1136   BASOSABS 0.0 12/28/2022 1136     Parts of this note may have been dictated using voice recognition software. There may be variances in spelling and vocabulary  which are unintentional. Not all errors are proofread. Please notify the Thereasa Parkin if any discrepancies are noted or if the meaning of any statement is not clear.

## 2023-09-14 NOTE — Patient Instructions (Signed)

## 2023-09-15 ENCOUNTER — Encounter: Payer: Self-pay | Admitting: "Endocrinology

## 2023-11-06 ENCOUNTER — Other Ambulatory Visit: Payer: Self-pay | Admitting: Physician Assistant

## 2023-11-06 ENCOUNTER — Other Ambulatory Visit: Payer: Self-pay | Admitting: Cardiology

## 2023-11-06 DIAGNOSIS — E1169 Type 2 diabetes mellitus with other specified complication: Secondary | ICD-10-CM

## 2023-11-06 DIAGNOSIS — N1831 Chronic kidney disease, stage 3a: Secondary | ICD-10-CM

## 2023-11-06 DIAGNOSIS — R140 Abdominal distension (gaseous): Secondary | ICD-10-CM

## 2023-11-06 DIAGNOSIS — Z8249 Family history of ischemic heart disease and other diseases of the circulatory system: Secondary | ICD-10-CM

## 2023-11-24 ENCOUNTER — Other Ambulatory Visit: Payer: Self-pay

## 2023-11-24 DIAGNOSIS — E1122 Type 2 diabetes mellitus with diabetic chronic kidney disease: Secondary | ICD-10-CM

## 2023-11-24 MED ORDER — BD PEN NEEDLE MINI U/F 31G X 5 MM MISC: 3 refills | Status: DC

## 2023-11-24 NOTE — Telephone Encounter (Signed)
Patient called requesting pen needles be sent to [facility].  Refill sent.

## 2023-12-08 ENCOUNTER — Other Ambulatory Visit: Payer: Self-pay

## 2023-12-08 DIAGNOSIS — N183 Chronic kidney disease, stage 3 unspecified: Secondary | ICD-10-CM

## 2023-12-08 DIAGNOSIS — E1140 Type 2 diabetes mellitus with diabetic neuropathy, unspecified: Secondary | ICD-10-CM

## 2023-12-13 ENCOUNTER — Other Ambulatory Visit: Payer: 59

## 2023-12-16 ENCOUNTER — Telehealth: Payer: Self-pay | Admitting: "Endocrinology

## 2023-12-16 DIAGNOSIS — E1122 Type 2 diabetes mellitus with diabetic chronic kidney disease: Secondary | ICD-10-CM

## 2023-12-16 MED ORDER — BD PEN NEEDLE MINI U/F 31G X 5 MM MISC: 3 refills | Status: DC

## 2023-12-16 NOTE — Telephone Encounter (Signed)
 Refill has been sent.

## 2023-12-16 NOTE — Telephone Encounter (Signed)
Patient requested that the RX for his pen needles be resent to the [id number] - [place], [place] - [address] .  Per patient he is no longer using [facility].

## 2023-12-17 ENCOUNTER — Ambulatory Visit: Payer: Self-pay | Admitting: Adult Health

## 2023-12-17 ENCOUNTER — Encounter: Payer: Self-pay | Admitting: Adult Health

## 2023-12-17 VITALS — BP 126/80 | HR 83 | Temp 97.9°F | Ht 72.0 in | Wt 206.2 lb

## 2023-12-17 DIAGNOSIS — R590 Localized enlarged lymph nodes: Secondary | ICD-10-CM

## 2023-12-17 DIAGNOSIS — J849 Interstitial pulmonary disease, unspecified: Secondary | ICD-10-CM | POA: Diagnosis not present

## 2023-12-17 MED ORDER — ALBUTEROL SULFATE HFA 108 (90 BASE) MCG/ACT IN AERS: 1.0000 | INHALATION_SPRAY | Freq: Four times a day (QID) | RESPIRATORY_TRACT | 2 refills | Status: DC | PRN

## 2023-12-17 NOTE — Assessment & Plan Note (Addendum)
Interstitial lung disease with UIP pattern-mediastinal and bilateral hilar adenopathy along with bronchiectasis and honeycombing-concerning for possible underlying sarcoidosis.  Kidney function would allow for contrast CT.  Would recommend high-resolution CT chest to further evaluate progressive changes.  Could consider possible EBUS for tissue sampling. Recent PFT showed a slight decline in lung function.  Clinically patient appears to be stable and remains very active.  Is having difficulty with employment and his line of work being able to pass respirator test.  Plan Patient Instructions  Set up HRCT chest.  Activity as tolerated.  Try Albuterol inhaler 1-2 puffs every 4-6hr as needed  Follow up with Dr. Francine Graven in 6-8 weeks and As needed

## 2023-12-17 NOTE — Patient Instructions (Addendum)
Set up HRCT chest.  Activity as tolerated.  Try Albuterol inhaler 1-2 puffs every 4-6hr as needed  Follow up with Dr. Francine Graven in 6-8 weeks and As needed

## 2023-12-17 NOTE — Progress Notes (Signed)
@Patient  ID: Wayne Webb, male    DOB: 10-18-1966, 58 y.o.   MRN: [id number]  Chief Complaint  Patient presents with   Follow-up    Referring provider: Wanda Plump, MD  HPI: 58 yo male with minimum smoking history followed for ILD  Medical history significant for diabetes and chronic kidney disease   TEST/EVENTS :  CT Chest 06/20/21 1. There is a spectrum of findings in the lungs considered diagnostic of usual interstitial pneumonia (UIP) per current ATS guidelines, with mild progression compared to remote prior studies from 2016. 2. There is associated mediastinal and bilateral hilar lymphadenopathy which is slightly greater than typically seen in the setting of interstitial lung disease. This fact, the presence of coarse calcifications, and the presence of scattered areas of perilymphatic micro nodularity in the lungs may suggest concurrent process such as sarcoidosis, although the parenchymal lung changes are otherwise not at all typical for sarcoidosis. 3. Right-sided aortic arch (normal anatomical variant) incidentally noted.   MRI Abdomen 05/17/21 Lower chest: Bulky adenopathy in the chest along RIGHT paratracheal chain and in the chest adjacent to the thoracic aorta in the posterior mediastinum. There is a RIGHT-sided aortic arch. Linear opacities are scattered throughout the lung bases and there is no sign of pleural effusion in the chest on limited assessment.   CT Chest 04/10/2015 Mediastinum/Nodes: Bilateral hilar and paratracheal adenopathy with scattered calcifications. An index right paratracheal node measures 3.1 cm in short axis on image 20 of series 2.   There is also right-sided aortic arch. Calcified lower periaortic lymph nodes are present.   Lungs/Pleura: Exaggerated lucency in the lungs suggests emphysema. Coarse interstitial opacity and honeycombing at the lung bases with volume loss in both lower lobes along the hemidiaphragms, and  also volume loss and cylindrical bronchiectasis in the right upper lobe along the minor fissure in the major fissure. There is volume loss in the lingula. The mild nodularity along the fissures  PFT 2024 moderate to severe airflow restriction and obstruction with decreased diffusing capacity.  2019 he had an serologic inflammatory workup completed which showed ANA 1:80 with a nucleolar pattern. Repeat inflammatory workup in 2022 was showed negative ANA and negative myositis panel. He had elevated CK level.   12/17/2023 Follow up: ILD  Patient presents for 84-month follow-up.  Patient has underlying interstitial lung disease with abnormal CT chest dating back to 2016 showing mediastinal and hilar adenopathy, bronchiectasis and honeycombing with interstitial opacities.  PFTs done last visit did show slight decline in lung function.  Serology in 2019 bilateral hilar and peritracheal adenopathy coarse interstitial opacities, bronchiectasis.  Previous serology for autoimmune and connective tissue labs with essentially negative except for elevated CK level.  Patient says he remains very active.  He walks on the treadmill on a regular basis.  Does have some limitations due to right knee pain.  Has a minimal smoking history.  Patient is currently unemployed.  He is trying to get a job but unfortunately not been able to pass the respirator check for job requirements due to his underlying lung capacity.  There has been concern that he could have underlying sarcoidosis previous ACE level was 13.  Patient has never been on oxygen.  He was previously tried on Trelegy in the past but does not remember if he had any perceived benefit.  He denies any significant cough or wheezing.  Does have some postnasal drainage.  No rash.  No known visual changes.  They have family history  of sarcoidosis. Last CT was in August 2022 that showed interstitial lung changes with UIP, mediastinal and bilateral hilar adenopathy.  Serial CT  chest previously recommended but was cost prohibitive with insurance deductible.   Patient denies any hemoptysis, chest pain, orthopnea, edema  No Known Allergies  Immunization History  Administered Date(s) Administered   Influenza Whole 08/19/2007   Influenza, Seasonal, Injecte, Preservative Fre 06/30/2023   Influenza,inj,Quad PF,6+ Mos 08/13/2019, 08/06/2020   Influenza-Unspecified 07/27/2011, 07/26/2021, 07/30/2022   PFIZER(Purple Top)SARS-COV-2 Vaccination 01/09/2020, 01/30/2020   PNEUMOCOCCAL CONJUGATE-20 10/10/2021   Pfizer Covid-19 Vaccine Bivalent Booster 44yrs & up 03/04/2021   Pneumococcal Conjugate-13 01/03/2014   Pneumococcal Polysaccharide-23 10/26/2005   Td 10/26/2004   Tdap 02/15/2015   Zoster Recombinant(Shingrix) 10/03/2019, 01/03/2020    Past Medical History:  Diagnosis Date   ALLERGIC RHINITIS 03/15/2007   CKD (chronic kidney disease) stage 3, GFR 30-59 ml/min (HCC)    CRI (chronic renal insufficiency)    CRI, creat 1.4 , renal u/s 04-2011 neg   DIABETES MELLITUS, TYPE II 03/15/2007   Diplopia 12/24/2009   Elevated LFTs    ERECTILE DYSFUNCTION 03/15/2007   GOITER, MULTINODULAR 03/21/2010   HYPERLIPIDEMIA 03/15/2007    Tobacco History: Social History   Tobacco Use  Smoking Status Former   Current packs/day: 0.00   Average packs/day: 0.5 packs/day for 10.0 years (5.0 ttl pk-yrs)   Types: Cigarettes   Start date: 10/27/1983   Quit date: 10/26/1993   Years since quitting: 30.1  Smokeless Tobacco Never   Counseling given: Not Answered   Outpatient Medications Prior to Visit  Medication Sig Dispense Refill   Azelastine HCl 137 MCG/SPRAY SOLN Place 2 sprays into both nostrils at bedtime as needed. 30 mL 5   Continuous Blood Gluc Sensor (DEXCOM G6 SENSOR) MISC 1 Device by Does not apply route See admin instructions. Change every 10 days 9 each 3   Continuous Glucose Sensor (DEXCOM G6 SENSOR) MISC CHANGE EVERY 10 DAYS 9 each 3   Continuous Glucose  Transmitter (DEXCOM G6 TRANSMITTER) MISC CHANGE EVERY 90 DAYS 1 each 2   empagliflozin (JARDIANCE) 25 MG TABS tablet Take 1 tablet (25 mg total) by mouth daily before breakfast. 90 tablet 1   ezetimibe (ZETIA) 10 MG tablet Take 1 tablet (10 mg total) by mouth daily. 90 tablet 1   Fluticasone-Umeclidin-Vilant (TRELEGY ELLIPTA) 100-62.5-25 MCG/ACT AEPB Inhale 1 puff into the lungs daily. 1 each 0   Glucagon (GVOKE HYPOPEN 1-PACK) 1 MG/0.2ML SOAJ Inject 1 mg into the skin as needed (low blood sugar with impaired consciousness). 0.4 mL 2   insulin glargine (LANTUS SOLOSTAR) 100 UNIT/ML Solostar Pen INJECT SUBCUTANEOUSLY 70 UNITS  AT 6PM (Patient taking differently: Inject 48 Units into the skin daily.) 75 mL 3   Insulin Pen Needle (B-D UF III MINI PEN NEEDLES) 31G X 5 MM MISC USE AS DIRECTED EVERY DAY 90 each 3   losartan (COZAAR) 50 MG tablet Take 1 tablet (50 mg total) by mouth daily. 90 tablet 1   OVER THE COUNTER MEDICATION Take 4 capsules by mouth daily. Nitric oxide supplement     OVER THE COUNTER MEDICATION Take 1 application by mouth daily as needed. Pre workout vitamin supplement mix     pantoprazole (PROTONIX) 40 MG tablet TAKE 1 TABLET BY MOUTH EVERY DAY 30 tablet 3   polyethylene glycol (MIRALAX) 17 g packet Take 17 g by mouth daily. 14 each 0   pravastatin (PRAVACHOL) 40 MG tablet TAKE 1 TABLET BY MOUTH EVERY DAY  IN THE EVENING 30 tablet 8   Protein POWD Take 1 scoop by mouth daily as needed.     sildenafil (REVATIO) 20 MG tablet TAKE 3-4 TABLETS BY MOUTH AT BEDTIME AS NEEDED 15 tablet 5   tirzepatide (MOUNJARO) 7.5 MG/0.5ML Pen Inject 7.5 mg into the skin once a week. 6 mL 1   No facility-administered medications prior to visit.     Review of Systems:   Constitutional:   No  weight loss, night sweats,  Fevers, chills, + fatigue, or  lassitude.  HEENT:   No headaches,  Difficulty swallowing,  Tooth/dental problems, or  Sore throat,                No sneezing, itching, ear ache,  nasal congestion, post nasal drip,   CV:  No chest pain,  Orthopnea, PND, swelling in lower extremities, anasarca, dizziness, palpitations, syncope.   GI  No heartburn, indigestion, abdominal pain, nausea, vomiting, diarrhea, change in bowel habits, loss of appetite, bloody stools.   Resp:   No chest wall deformity  Skin: no rash or lesions.  GU: no dysuria, change in color of urine, no urgency or frequency.  No flank pain, no hematuria   MS:  No joint pain or swelling.  No decreased range of motion.  No back pain.    Physical Exam  BP 126/80 (BP Location: Right Arm, Patient Position: Sitting, Cuff Size: Large)   Pulse 83   Temp 97.9 F (36.6 C) (Oral)   Ht 6' (1.829 m)   Wt 206 lb 3.2 oz (93.5 kg)   SpO2 97%   BMI 27.97 kg/m   GEN: A/Ox3; pleasant , NAD, well nourished    HEENT:  [place]/AT,   NOSE-clear, THROAT-clear, no lesions, no postnasal drip or exudate noted.   NECK:  Supple w/ fair ROM; no JVD; normal carotid impulses w/o bruits; no thyromegaly or nodules palpated; no lymphadenopathy.    RESP  Clear  P & A; w/o, wheezes/ rales/ or rhonchi. no accessory muscle use, no dullness to percussion  CARD:  RRR, no m/r/g, no peripheral edema, pulses intact, no cyanosis or clubbing.  GI:   Soft & nt; nml bowel sounds; no organomegaly or masses detected.   Musco: Warm bil, no deformities or joint swelling noted.   Neuro: alert, no focal deficits noted.    Skin: Warm, no lesions or rashes    Lab Results:  CBC    Component Value Date/Time   WBC 4.8 12/28/2022 1136   RBC 3.96 (L) 12/28/2022 1136   HGB 11.8 (A) 07/14/2023 0000   HCT 35.2 (L) 12/28/2022 1136   PLT 173.0 12/28/2022 1136   MCV 88.9 12/28/2022 1136   MCH 29.9 12/18/2020 1101   MCHC 33.5 12/28/2022 1136   RDW 12.9 12/28/2022 1136   LYMPHSABS 0.8 12/28/2022 1136   MONOABS 0.9 12/28/2022 1136   EOSABS 0.4 12/28/2022 1136   BASOSABS 0.0 12/28/2022 1136    BMET    Component Value Date/Time   NA 140  07/14/2023 0000   K 4.6 07/14/2023 0000   CL 107 07/14/2023 0000   CO2 28 (A) 07/14/2023 0000   GLUCOSE 105 (H) 06/30/2023 0832   BUN 59 (A) 07/14/2023 0000   CREATININE 2.8 (A) 07/14/2023 0000   CREATININE 2.43 (H) 06/30/2023 0832   CALCIUM 10.6 07/14/2023 0000   GFRNONAA 28 (L) 12/18/2020 1101   GFRAA 46 09/29/2021 0000    BNP    Component Value Date/Time   BNP  15.3 12/18/2020 1507    ProBNP    Component Value Date/Time   PROBNP 41 09/02/2021 1126    Imaging: No results found.  Administration History     None          Latest Ref Rng & Units 06/08/2023    8:47 AM 02/19/2015   11:02 AM  PFT Results  FVC-Pre L 2.43  2.64   FVC-Predicted Pre % 47  56   FVC-Post L 2.49  2.72   FVC-Predicted Post % 48  58   Pre FEV1/FVC % % 69  70   Post FEV1/FCV % % 72  72   FEV1-Pre L 1.67  1.84   FEV1-Predicted Pre % 42  49   FEV1-Post L 1.79  1.95   DLCO uncorrected ml/min/mmHg 14.66  19.36   DLCO UNC% % 49  53   DLCO corrected ml/min/mmHg 14.66    DLCO COR %Predicted % 49    DLVA Predicted % 91  99   TLC L 4.40  3.86   TLC % Predicted % 59  51   RV % Predicted % 57  52     No results found for: "NITRICOXIDE"      Assessment & Plan:   No problem-specific Assessment & Plan notes found for this encounter.     Rubye Oaks, NP 12/17/2023

## 2023-12-22 ENCOUNTER — Encounter: Payer: Self-pay | Admitting: Pulmonary Disease

## 2023-12-23 ENCOUNTER — Ambulatory Visit: Payer: 59 | Admitting: "Endocrinology

## 2023-12-27 NOTE — Telephone Encounter (Signed)
 Discussed case with Dr .Francine Graven, He has ILD with restriction on PFT.  Patient remains active, walking daily, not on Oxygen.  Can be approved for Respirator at work -per request 25% of job time. . If he has any paper work with requirements for respirator to make sure that we review in detail that would be best.  Please let patient know. If he has any difficulties with Respirator at work , wearing or completing job functions with respirator please let us know.

## 2023-12-29 ENCOUNTER — Ambulatory Visit (HOSPITAL_COMMUNITY)
Admission: RE | Admit: 2023-12-29 | Discharge: 2023-12-29 | Disposition: A | Discharge status: Home / Self Care | Payer: 59 | Source: Ambulatory Visit | Attending: Adult Health | Admitting: Adult Health

## 2023-12-29 DIAGNOSIS — J849 Interstitial pulmonary disease, unspecified: Secondary | ICD-10-CM | POA: Diagnosis not present

## 2023-12-29 DIAGNOSIS — R590 Localized enlarged lymph nodes: Secondary | ICD-10-CM | POA: Insufficient documentation

## 2023-12-29 DIAGNOSIS — Q2547 Right aortic arch: Secondary | ICD-10-CM | POA: Diagnosis not present

## 2023-12-30 ENCOUNTER — Telehealth: Payer: Self-pay | Admitting: Pharmacy Technician

## 2023-12-30 ENCOUNTER — Other Ambulatory Visit (HOSPITAL_COMMUNITY): Payer: Self-pay

## 2023-12-30 NOTE — Telephone Encounter (Signed)
 Per test claim for sildenafil this medication is not covered by his insurance for the diagnosis they prescribed this for. [facility] called [facility] and they filled the rx on a discount card and cost is 17.66

## 2024-01-02 ENCOUNTER — Other Ambulatory Visit: Payer: Self-pay | Admitting: Internal Medicine

## 2024-01-03 ENCOUNTER — Ambulatory Visit (INDEPENDENT_AMBULATORY_CARE_PROVIDER_SITE_OTHER): Payer: 59 | Admitting: Internal Medicine

## 2024-01-03 ENCOUNTER — Encounter: Payer: Self-pay | Admitting: Internal Medicine

## 2024-01-03 VITALS — BP 116/70 | HR 73 | Temp 98.0°F | Resp 16 | Ht 72.0 in | Wt 210.2 lb

## 2024-01-03 DIAGNOSIS — Z1211 Encounter for screening for malignant neoplasm of colon: Secondary | ICD-10-CM

## 2024-01-03 DIAGNOSIS — E785 Hyperlipidemia, unspecified: Secondary | ICD-10-CM | POA: Diagnosis not present

## 2024-01-03 DIAGNOSIS — Z0000 Encounter for general adult medical examination without abnormal findings: Secondary | ICD-10-CM

## 2024-01-03 DIAGNOSIS — E1122 Type 2 diabetes mellitus with diabetic chronic kidney disease: Secondary | ICD-10-CM | POA: Diagnosis not present

## 2024-01-03 DIAGNOSIS — I10 Essential (primary) hypertension: Secondary | ICD-10-CM | POA: Diagnosis not present

## 2024-01-03 DIAGNOSIS — Z794 Long term (current) use of insulin: Secondary | ICD-10-CM

## 2024-01-03 DIAGNOSIS — N1831 Chronic kidney disease, stage 3a: Secondary | ICD-10-CM

## 2024-01-03 LAB — COMPREHENSIVE METABOLIC PANEL
ALT: 66 U/L — ABNORMAL HIGH (ref 0–53)
AST: 61 U/L — ABNORMAL HIGH (ref 0–37)
Albumin: 4.5 g/dL (ref 3.5–5.2)
Alkaline Phosphatase: 92 U/L (ref 39–117)
BUN: 62 mg/dL — ABNORMAL HIGH (ref 6–23)
CO2: 26 meq/L (ref 19–32)
Calcium: 10.5 mg/dL (ref 8.4–10.5)
Chloride: 106 meq/L (ref 96–112)
Creatinine, Ser: 2.58 mg/dL — ABNORMAL HIGH (ref 0.40–1.50)
GFR: 26.35 mL/min — ABNORMAL LOW (ref >60.00)
Glucose, Bld: 121 mg/dL — ABNORMAL HIGH (ref 70–99)
Potassium: 4.4 meq/L (ref 3.5–5.1)
Sodium: 141 meq/L (ref 135–145)
Total Bilirubin: 0.8 mg/dL (ref 0.2–1.2)
Total Protein: 7.8 g/dL (ref 6.0–8.3)

## 2024-01-03 LAB — LIPID PANEL
Cholesterol: 106 mg/dL (ref 0–200)
HDL: 36.5 mg/dL — ABNORMAL LOW (ref >39.00)
LDL Cholesterol: 48 mg/dL (ref 0–99)
NonHDL: 69.51
Total CHOL/HDL Ratio: 3
Triglycerides: 108 mg/dL (ref 0.0–149.0)
VLDL: 21.6 mg/dL (ref 0.0–40.0)

## 2024-01-03 LAB — PSA: PSA: 2.55 ng/mL (ref 0.10–4.00)

## 2024-01-03 MED ORDER — TRELEGY ELLIPTA 100-62.5-25 MCG/ACT IN AEPB: 1.0000 | INHALATION_SPRAY | Freq: Every day | RESPIRATORY_TRACT | Status: AC

## 2024-01-03 NOTE — Assessment & Plan Note (Signed)
 Here for CPX  Other issues addressed today: DM,: LOV endo  09/14/2023.  A1c 6.2 Hyperlipidemia: On Zetia, Pravachol.  Checking labs. CKD: Checking labs.  LOV nephrology 06-2023. Interstitial lung disease.  Saw pulmonary 12/17/2023, they noted recent PFT showed declining lung function.  Was Rx high-resolution CT, report pending.. currently on albuterol only, was given a sample for Trelegy, not using it.  Encouraged to try. RTC 6 months

## 2024-01-03 NOTE — Patient Instructions (Addendum)
 Vaccines I recommend: COVID booster is no known since September 2024 RSV  GO TO THE LAB : Get the blood work     Please go to the front desk: Schedule follow-up in 6 months    If you have MyChart, please check frequently in the next few days for your results

## 2024-01-03 NOTE — Progress Notes (Signed)
 Subjective:    Patient ID: Wayne Webb, male    DOB: [birth year], 58 y.o.   MRN: [id number]  DOS:  01/03/2024 Type of visit - description: CPX  Here for CPX. Chronic medical problems addressed. Currently on albuterol only, that has help with his mild cough, has noted less shortness of breath.   Review of Systems  Other than above, a 14 point review of systems is negative     Past Medical History:  Diagnosis Date   ALLERGIC RHINITIS 03/15/2007   CKD (chronic kidney disease) stage 3, GFR 30-59 ml/min (HCC)    CRI (chronic renal insufficiency)    CRI, creat 1.4 , renal u/s 04-2011 neg   DIABETES MELLITUS, TYPE II 03/15/2007   Diplopia 12/24/2009   Elevated LFTs    ERECTILE DYSFUNCTION 03/15/2007   GOITER, MULTINODULAR 03/21/2010   HYPERLIPIDEMIA 03/15/2007    Past Surgical History:  Procedure Laterality Date   LASIK Left 10/2012   Social History   Socioeconomic History   Marital status: Divorced    Spouse name: Not on file   Number of children: 1   Years of education: Not on file   Highest education level: Not on file  Occupational History   Occupation: in [employer] jobs    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    Household: pt and daughter    Social Drivers of Corporate investment banker Strain: Not on file  Food Insecurity: Not on file  Transportation Needs: Not on file  Physical Activity: Not on file  Stress: Not on file  Social Connections: Not on file   Intimate Partner Violence: Not on file     Current Outpatient Medications  Medication Instructions   albuterol (VENTOLIN HFA) 108 (90 Base) MCG/ACT inhaler 1-2 puffs, Inhalation, Every 6 hours PRN   Azelastine HCl 137 MCG/SPRAY SOLN 2 sprays, Each Nare, At bedtime PRN   Continuous Blood Gluc Sensor (DEXCOM G6 SENSOR) MISC 1 Device, Does not apply, See admin instructions, Change every 10 days   Continuous Glucose Sensor (DEXCOM G6 SENSOR) MISC CHANGE EVERY 10 DAYS   Continuous Glucose Transmitter (DEXCOM G6 TRANSMITTER) MISC CHANGE EVERY 90 DAYS   empagliflozin (JARDIANCE) 25 mg, Oral, Daily before breakfast   ezetimibe (ZETIA) 10 mg, Oral, Daily   Fluticasone-Umeclidin-Vilant (TRELEGY ELLIPTA) 100-62.5-25 MCG/ACT AEPB 1 puff, Inhalation, Daily   Gvoke HypoPen 1-Pack 1 mg, Subcutaneous, As needed   insulin glargine (LANTUS SOLOSTAR) 100 UNIT/ML Solostar Pen INJECT SUBCUTANEOUSLY 70 UNITS  AT 6PM   Insulin Pen Needle (B-D UF III MINI PEN NEEDLES) 31G X 5 MM MISC USE AS DIRECTED EVERY DAY   losartan (COZAAR) 50 mg, Oral, Daily   OVER THE COUNTER MEDICATION 4 capsules, Daily   OVER THE COUNTER MEDICATION 1 application , Daily PRN   pantoprazole (PROTONIX) 40 mg, Oral, Daily   polyethylene  glycol (MIRALAX) 17 g, Oral, Daily   pravastatin (PRAVACHOL) 40 MG tablet Every evening   Protein POWD 1 Scoop, Daily PRN   sildenafil (REVATIO) 20 MG tablet TAKE 3-4 TABLETS BY MOUTH AT BEDTIME AS NEEDED   tirzepatide (MOUNJARO) 7.5 mg, Subcutaneous, Weekly       Objective:   Physical Exam BP 116/70   Pulse 73   Temp 98 F (36.7 C) (Oral)   Resp 16   Ht 6' (1.829 m)   Wt 210 lb 4 oz (95.4 kg) Comment: w/ shoes  SpO2 98%   BMI 28.52 kg/m  General: Well developed, NAD, BMI noted Neck: No  thyromegaly  HEENT:  Normocephalic . Face symmetric, atraumatic Lungs:  CTA B Normal respiratory effort, no intercostal retractions, no accessory muscle use. Heart: RRR,  no murmur.  Abdomen:  Not  distended, soft, non-tender. No rebound or rigidity.   Lower extremities: no pretibial edema bilaterally  Skin: Exposed areas without rash. Not pale. Not jaundice Neurologic:  alert & oriented X3.  Speech normal, gait appropriate for age and unassisted Strength symmetric and appropriate for age.  Psych: Cognition and judgment appear intact.  Cooperative with normal attention span and concentration.  Behavior appropriate. No anxious or depressed appearing.     Assessment     Assessment DM - endo Hyperlipidemia (h/o increased LFTs w/ simva 2013) Goiter -- per endo- last [place] 06/2016 stable CKD - renal US (-) 2012 ; not f/u  by nephrology as off 09/2018 ED Elevated LFTs Ultrasound 2015 negative,  hepatitis serologies (-) 01-2015: ferritin (-) previously slt elevated; wnl  transferrin saturation, ANA, ceruloplasmin, anti smooth muscles ab. Alpha 1 antitrypsin.  Saw GI 10/2017: U/S wnl, labs borderline abnormal was RX a liver Bx History of ERCP per GI notes reviewed. Pulmonary: --[facility] --Chronic Cough, better after ACEi d/c . Dr. Sherene Sires -- Abnormal lung imaging: dx, Burnout sarcoidosis, then possibly interstitial pneumonia CV:  Dr Odis Hollingshead     -Cardiomyopathy: saw cards d/t CP, w/u 7- 2022: Coronary calcium score 0, stress test low risk, echo: 35 to 40%.       PLAN: Here for CPX -Td 2016 -  PNM 23: 2015; prevnar : 2015; PNM 20: 2022 -shingrix completed - had a flu shot  -covid vax rec booster and RSV recommended - (+) FH prostate cancer, no symptoms, check PSA. - CCS: Colonoscopy, 02/2014, 1 polyp, next 10 years, GI referral placed - Diet exercise: Doing well   -Labs today: CMP FLP PSA Other issues addressed today: DM,: LOV endo  09/14/2023.  A1c 6.2 Hyperlipidemia: On Zetia, Pravachol.  Checking labs. CKD: Checking labs.  LOV nephrology 06-2023. Interstitial lung disease.  Saw pulmonary 12/17/2023, they noted recent PFT showed declining lung function.  Was Rx high-resolution CT, report  pending.. currently on albuterol only, was given a sample for Trelegy, not using it.  Encouraged to try. RTC 6 months

## 2024-01-03 NOTE — Assessment & Plan Note (Signed)
 Here for CPX -Td 2016 -  PNM 23: 2015; prevnar : 2015; PNM 20: 2022 -shingrix completed - had a flu shot  -covid vax rec booster and RSV recommended - (+) FH prostate cancer, no symptoms, check PSA. - CCS: Colonoscopy, 02/2014, 1 polyp, next 10 years, GI referral placed - Diet exercise: Doing well   -Labs today: CMP FLP PSA

## 2024-01-04 ENCOUNTER — Telehealth: Payer: Self-pay | Admitting: Pulmonary Disease

## 2024-01-04 NOTE — Telephone Encounter (Signed)
 This is not urgent Dr. Francine Graven can take care of this on his return. Please advice 5-7 business days for paperwork.

## 2024-01-04 NOTE — Telephone Encounter (Signed)
 I called and spoke to pt. Pt states the work note has to have a letterhead. Pt states the note has to states his restrictions. Pt states he is able to work, but he has to be able to wear his respirators at least 1/4 of the time. His job does not require for him to have this. Pt states he sent them the screen shot from [employer], but they could not accept this. Pt states this could be sent to [facility] email. Francine Graven is not here this week. Sending to DOD (Dr Celine Mans)

## 2024-01-04 NOTE — Telephone Encounter (Signed)
 Patient needs work note to be sent about his work restrictions. They need the document on a proper letterhead.   It needs to be sent to  [email or web address]

## 2024-01-05 ENCOUNTER — Encounter: Payer: Self-pay | Admitting: Internal Medicine

## 2024-01-06 NOTE — Telephone Encounter (Signed)
 Please see previous MyChart messages. I have asked that a letter be written and printed out for me to sign when I am back in clinic.   JD

## 2024-01-07 ENCOUNTER — Telehealth: Payer: Self-pay

## 2024-01-07 NOTE — Telephone Encounter (Signed)
 spoke w/ PT about paper said he will stop by today friday 03/14 i did states that we are only open till noon today PT verbilized he understood

## 2024-01-21 DIAGNOSIS — E1122 Type 2 diabetes mellitus with diabetic chronic kidney disease: Secondary | ICD-10-CM | POA: Diagnosis not present

## 2024-01-21 DIAGNOSIS — N1832 Chronic kidney disease, stage 3b: Secondary | ICD-10-CM | POA: Diagnosis not present

## 2024-01-21 DIAGNOSIS — I5020 Unspecified systolic (congestive) heart failure: Secondary | ICD-10-CM | POA: Diagnosis not present

## 2024-01-21 DIAGNOSIS — N1831 Chronic kidney disease, stage 3a: Secondary | ICD-10-CM | POA: Diagnosis not present

## 2024-01-21 DIAGNOSIS — I129 Hypertensive chronic kidney disease with stage 1 through stage 4 chronic kidney disease, or unspecified chronic kidney disease: Secondary | ICD-10-CM | POA: Diagnosis not present

## 2024-01-21 DIAGNOSIS — D869 Sarcoidosis, unspecified: Secondary | ICD-10-CM | POA: Diagnosis not present

## 2024-01-21 LAB — PROTEIN / CREATININE RATIO, URINE
Albumin, U: 98.6
Creatinine, Urine: 118.2

## 2024-01-26 ENCOUNTER — Telehealth: Payer: Self-pay | Admitting: "Endocrinology

## 2024-01-26 ENCOUNTER — Other Ambulatory Visit: Payer: Self-pay

## 2024-01-26 ENCOUNTER — Encounter: Payer: Self-pay | Admitting: Internal Medicine

## 2024-01-26 DIAGNOSIS — Z794 Long term (current) use of insulin: Secondary | ICD-10-CM

## 2024-01-26 MED ORDER — DEXCOM G6 TRANSMITTER MISC: 2 refills | Status: DC

## 2024-01-26 MED ORDER — DEXCOM G6 SENSOR MISC: 3 refills | Status: DC

## 2024-01-26 NOTE — Telephone Encounter (Signed)
 New  message    1. Which medications need to be refilled? (please list name of each medication and dose if known)   Continuous Blood Gluc Sensor (DEXCOM G6 SENSOR) MISC Continuous Glucose Sensor (DEXCOM G6 SENSOR) MISC Continuous Glucose Transmitter (DEXCOM G6 TRANSMITTER) MISC   2.  Which pharmacy/location (including street and city if local pharmacy) is medication to be sent to? [id number], [place] - [address] Phone: [phone]  Fax: [phone]       5. Do they need a 30 day or 90 day supply?

## 2024-01-26 NOTE — Telephone Encounter (Signed)
 Refill for Dexcom sent to the pharmacy.

## 2024-01-26 NOTE — Telephone Encounter (Signed)
 Requested Prescriptions   Pending Prescriptions Disp Refills   Continuous Glucose Sensor (DEXCOM G6 SENSOR) MISC 9 each 3    Sig: Change every 10 days.   Continuous Glucose Transmitter (DEXCOM G6 TRANSMITTER) MISC 1 each 2    Sig: Change every 90 days

## 2024-01-27 ENCOUNTER — Other Ambulatory Visit: Payer: Self-pay | Admitting: Physician Assistant

## 2024-01-27 DIAGNOSIS — R140 Abdominal distension (gaseous): Secondary | ICD-10-CM

## 2024-02-16 ENCOUNTER — Ambulatory Visit: Payer: 59 | Admitting: Pulmonary Disease

## 2024-02-16 ENCOUNTER — Encounter: Payer: Self-pay | Admitting: Pulmonary Disease

## 2024-02-16 VITALS — BP 127/80 | HR 72 | Ht 72.0 in | Wt 206.0 lb

## 2024-02-16 DIAGNOSIS — Z87891 Personal history of nicotine dependence: Secondary | ICD-10-CM

## 2024-02-16 DIAGNOSIS — R590 Localized enlarged lymph nodes: Secondary | ICD-10-CM | POA: Diagnosis not present

## 2024-02-16 DIAGNOSIS — J849 Interstitial pulmonary disease, unspecified: Secondary | ICD-10-CM | POA: Diagnosis not present

## 2024-02-16 NOTE — Patient Instructions (Signed)
 Your CT Chest scan is stable,  We will continue to monitor your lungs with CT Chest scans and breathing tests  I will review your CT scans at our Interstitial Lung Disease Conference  Follow up in 1 year, call sooner if needed

## 2024-02-16 NOTE — Progress Notes (Signed)
 Synopsis: Referred in August 2022 for Abnormal MRI  Subjective:   PATIENT ID: Wayne Webb GENDER: male DOB: March 26, 1966, MRN: [id number]  HPI  Chief Complaint  Patient presents with   Follow-up   Wayne Webb is a 58 year old male, former smoker with DMII and hyperlipidemia who returns to pulmonary clinic for interstitial lung disease.  Patient was seen by Roena Clark, NP 12/17/23. His HRCT 12/29/23 shows stable mediastinal adenopathy and parenchymal scarring consistent with sarcoid, similar to 2022.   He reports no changes in his respiratory symptoms.   OV 06/08/23 He has been doing well since last visit.  He has been working out and [employer] routinely.  Shortness of breath does not limit his ability to workout or exercise.  He does report dyspnea if he walks up multiple flights of stairs.  He tried Trelegy at last visit without any improvement in his cough or breathing.  He continues to have postnasal drip in which he uses Astelin  as needed.  PFTs today show mixed obstructive and restrictive ventilatory defects with moderate diffusion defect.  No significant bronchodilator response.  OV 01/13/23 He has post-nasal drip. He was recently started on Azelastine  nasal spray which does provide relief. He did not like flonase  nasal spray due to nose bleeds.   He has to wear respirator at work for emergencies. He is having to be fit tested but has trouble passing the breathing test at work. Overall he denies any issues with dyspnea with his daily activities and denies cough or wheezing. He has lost weight due to working out and maintaining a better diet due to his diabetes.   OV 05/27/2021 He reports having shortness of breath with exertion since March/April this year. He has noticed the dyspnea when working out at the gym and when using the stairs or ladders at work. He works as a Armed forces training and education officer at [employer] where he is exposed to various chemicals and elements that are  used to make microchips.   He does have a cough related to post-nasal drip and clearing his throat. He denies any GERD/Heartburn symptoms. He complains of left sided chest discomfort which is not relived with lidocaine  patch. He had issues taking rosuvastatin  recently due to myalgias which improved somewhat after being taken off this medication.   He was seen previously by Dr. Waymond Hailey in 2015-2016 for cough which was thought to be related to ACEi and burned out sarcoidosis.   He was evaluated by cardiology on 7/28, note reviewed, and is scheduled for an ECHO, stress test and Cardiac CT Chest.   Past Medical History:  Diagnosis Date   ALLERGIC RHINITIS 03/15/2007   CKD (chronic kidney disease) stage 3, GFR 30-59 ml/min (HCC)    CRI (chronic renal insufficiency)    CRI, creat 1.4 , renal u/s 04-2011 neg   DIABETES MELLITUS, TYPE II 03/15/2007   Diplopia 12/24/2009   Elevated LFTs    ERECTILE DYSFUNCTION 03/15/2007   GOITER, MULTINODULAR 03/21/2010   HYPERLIPIDEMIA 03/15/2007     Family History  Problem Relation Age of Onset   Heart disease Mother        CABG-onset in her early 103's   Diabetes Mother 7       had DM from her 41's   Hypertension Mother    Prostate cancer Father 30       dx in his 71s   Stroke Neg Hx    Colon cancer Neg Hx      Social  History   Socioeconomic History   Marital status: Divorced    Spouse name: Not on file   Number of children: 1   Years of education: Not on file   Highest education level: Not on file  Occupational History   Occupation: in [employer] jobs    Comment: Maintenence   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    Household: pt and daughter    Social Drivers of Corporate investment banker Strain: Not on file  Food Insecurity: Not on file  Transportation Needs: Not on file  Physical Activity: Not on file  Stress: Not on file  Social Connections: Not on file  Intimate Partner Violence: Not on file     No Known Allergies   Outpatient Medications Prior to Visit  Medication Sig Dispense Refill   albuterol  (VENTOLIN  HFA) 108 (90 Base) MCG/ACT inhaler Inhale 1-2 puffs into the lungs every 6 (six) hours as needed. 8 g 2   Azelastine  HCl 137 MCG/SPRAY SOLN Place 2 sprays into both nostrils at bedtime as needed. 30 mL 5   Continuous Glucose Sensor (DEXCOM G6 SENSOR) MISC Change every 10 days. 9 each 3   Continuous Glucose Transmitter (DEXCOM G6 TRANSMITTER) MISC Change every 90 days 1 each 2   empagliflozin  (JARDIANCE ) 25 MG TABS tablet Take 1 tablet (25 mg total) by mouth daily before breakfast. 90 tablet 1   ezetimibe  (ZETIA ) 10 MG tablet Take 1 tablet (10 mg total) by mouth daily. 90 tablet 1   Fluticasone -Umeclidin-Vilant (TRELEGY ELLIPTA ) 100-62.5-25 MCG/ACT AEPB Inhale 1 puff into the lungs daily.     Glucagon  (GVOKE HYPOPEN  1-PACK) 1 MG/0.2ML SOAJ Inject 1 mg into the skin as needed (low blood sugar with impaired consciousness). 0.4 mL 2   insulin  glargine (LANTUS  SOLOSTAR) 100 UNIT/ML Solostar Pen INJECT SUBCUTANEOUSLY 70 UNITS  AT 6PM (Patient taking differently: Inject 48 Units into the skin daily.) 75 mL 3   Insulin  Pen Needle (B-D UF III MINI PEN NEEDLES) 31G X 5 MM MISC USE AS DIRECTED EVERY DAY 90 each 3   losartan  (COZAAR ) 50 MG tablet Take 1 tablet (50 mg total) by mouth daily. 90 tablet 1   OVER THE COUNTER MEDICATION Take 4 capsules by mouth daily. Nitric oxide supplement     OVER THE COUNTER MEDICATION Take 1 application by mouth daily as  needed. Pre workout vitamin supplement mix     pantoprazole  (PROTONIX ) 40 MG tablet TAKE 1 TABLET BY MOUTH EVERY DAY 90 tablet 2   polyethylene glycol (MIRALAX ) 17 g packet Take 17 g by mouth daily. 14 each 0   pravastatin  (PRAVACHOL ) 40 MG tablet TAKE 1 TABLET BY MOUTH EVERY DAY IN THE EVENING 30 tablet 8   Protein POWD Take 1 scoop by mouth daily as needed.     sildenafil  (REVATIO ) 20 MG tablet TAKE 3-4 TABLETS BY MOUTH AT  BEDTIME AS NEEDED 15 tablet 5   tirzepatide  (MOUNJARO ) 7.5 MG/0.5ML Pen Inject 7.5 mg into the skin once a week. 6 mL 1   No facility-administered medications prior to visit.   Review of Systems  Constitutional:  Negative for chills, fever, malaise/fatigue and weight loss.  HENT:  Positive for congestion (post-nasal drip). Negative for sinus pain and sore throat.   Eyes: Negative.   Respiratory:  Positive for cough. Negative for hemoptysis, sputum production, shortness of breath and wheezing.   Cardiovascular:  Negative for chest pain, palpitations, orthopnea, claudication and leg swelling.  Gastrointestinal:  Negative for abdominal pain, heartburn, nausea and vomiting.  Genitourinary: Negative.   Musculoskeletal:  Negative for joint pain and myalgias.  Skin:  Negative for rash.  Neurological:  Negative for weakness.  Endo/Heme/Allergies: Negative.   Psychiatric/Behavioral: Negative.      Objective:   Vitals:   02/16/24 0857  BP: 127/80  Pulse: 72  SpO2: 94%  Weight: 206 lb (93.4 kg)  Height: 6' (1.829 m)     Physical Exam Constitutional:      General: He is not in acute distress. HENT:     Head: Normocephalic and atraumatic.  Eyes:     Conjunctiva/sclera: Conjunctivae normal.  Cardiovascular:     Rate and Rhythm: Normal rate and regular rhythm.     Pulses: Normal pulses.     Heart sounds: Normal heart sounds. No murmur heard. Pulmonary:     Effort: Pulmonary effort is normal.     Breath sounds: No wheezing, rhonchi or rales.  Musculoskeletal:      Right lower leg: No edema.     Left lower leg: No edema.  Skin:    General: Skin is warm and dry.  Neurological:     General: No focal deficit present.     Mental Status: He is alert.     CBC    Component Value Date/Time   WBC 4.8 12/28/2022 1136   RBC 3.96 (L) 12/28/2022 1136   HGB 11.8 (A) 07/14/2023 0000   HCT 35.2 (L) 12/28/2022 1136   PLT 173.0 12/28/2022 1136   MCV 88.9 12/28/2022 1136   MCH 29.9 12/18/2020 1101   MCHC 33.5 12/28/2022 1136   RDW 12.9 12/28/2022 1136   LYMPHSABS 0.8 12/28/2022 1136   MONOABS 0.9 12/28/2022 1136   EOSABS 0.4 12/28/2022 1136   BASOSABS 0.0 12/28/2022 1136      Latest Ref Rng & Units 01/03/2024    9:16 AM 07/14/2023   12:00 AM 06/30/2023    8:32 AM  BMP  Glucose 70 - 99 mg/dL 841   660   BUN 6 - 23 mg/dL 62  59     41   Creatinine 0.40 - 1.50 mg/dL 6.30  2.8     1.60   Sodium 135 - 145 mEq/L 141  140     142   Potassium 3.5 - 5.1 mEq/L 4.4  4.6     4.0   Chloride 96 - 112 mEq/L 106  107     104   CO2 19 - 32 mEq/L 26  28     30    Calcium  8.4 - 10.5 mg/dL 10.9  32.3     55.7      This result is from an external source.   Chest imaging: HRCT Chest 12/29/23 Mediastinum/Nodes: Calcified and noncalcified bulky mediastinal adenopathy, unchanged. No axillary adenopathy. Esophagus is grossly unremarkable.   Lungs/Pleura: Peribronchovascular nodularity, interstitial ground-glass and cylindrical/cystic bronchiectasis, unchanged. Findings  appear unchanged from 06/20/2021. No pleural fluid. Airway is unremarkable.  CT Chest 06/20/21 1. There is a spectrum of findings in the lungs considered diagnostic of usual interstitial pneumonia (UIP) per current ATS guidelines, with mild progression compared to remote prior studies from 2016. 2. There is associated mediastinal and bilateral hilar lymphadenopathy which is slightly greater than typically seen in the setting of interstitial lung disease. This fact, the presence of coarse  calcifications, and the presence of scattered areas of perilymphatic micro nodularity in the lungs may suggest concurrent process such as sarcoidosis, although the parenchymal lung changes are otherwise not at all typical for sarcoidosis. 3. Right-sided aortic arch (normal anatomical variant) incidentally noted.  MRI Abdomen 05/17/21 Lower chest: Bulky adenopathy in the chest along RIGHT paratracheal chain and in the chest adjacent to the thoracic aorta in the posterior mediastinum. There is a RIGHT-sided aortic arch. Linear opacities are scattered throughout the lung bases and there is no sign of pleural effusion in the chest on limited assessment.  CT Chest 04/10/2015 Mediastinum/Nodes: Bilateral hilar and paratracheal adenopathy with scattered calcifications. An index right paratracheal node measures 3.1 cm in short axis on image 20 of series 2.   There is also right-sided aortic arch. Calcified lower periaortic lymph nodes are present.   Lungs/Pleura: Exaggerated lucency in the lungs suggests emphysema. Coarse interstitial opacity and honeycombing at the lung bases with volume loss in both lower lobes along the hemidiaphragms, and also volume loss and cylindrical bronchiectasis in the right upper lobe along the minor fissure in the major fissure. There is volume loss in the lingula. The mild nodularity along the fissures  PFT:    Latest Ref Rng & Units 06/08/2023    8:47 AM 02/19/2015   11:02 AM  PFT Results  FVC-Pre L 2.43  2.64   FVC-Predicted Pre % 47  56   FVC-Post L 2.49  2.72   FVC-Predicted Post % 48  58   Pre FEV1/FVC % % 69  70   Post FEV1/FCV % % 72  72   FEV1-Pre L 1.67  1.84   FEV1-Predicted Pre % 42  49   FEV1-Post L 1.79  1.95   DLCO uncorrected ml/min/mmHg 14.66  19.36   DLCO UNC% % 49  53   DLCO corrected ml/min/mmHg 14.66    DLCO COR %Predicted % 49    DLVA Predicted % 91  99   TLC L 4.40  3.86   TLC % Predicted % 59  51   RV % Predicted % 57  52    PFT 2016: Moderate restriction, Moderate diffusion defect    Assessment & Plan:   ILD (interstitial lung disease) (HCC) - Plan: CT CHEST HIGH RESOLUTION, CANCELED: CT CHEST HIGH RESOLUTION  Mediastinal adenopathy  Discussion: Ravon Mortellaro is a 58 year old male, former smoker with DMII and hyperlipidemia who returns to pulmonary clinic for ILD.   Sarcoidosis of lung Interstitial lung disease with fibrosis - CT scan findings are stable since 2022 - PFTs show moderate restriction and moderate diffusion defect - In 2019 he had an serologic inflammatory workup completed which showed ANA 1:80 with a nucleolar pattern. Repeat inflammatory workup in 2022 showed negative ANA and negative myositis panel. He had elevated CK level.   Calcification of coronary artery - follow up with cardiology  Follow-up - Schedule follow-up in one year with repeat CT scan in March 2026   Duaine German, MD  Pulmonary & Critical Care Office: [phone]  Current Outpatient Medications:    albuterol  (VENTOLIN  HFA) 108 (90 Base) MCG/ACT inhaler, Inhale 1-2 puffs into the lungs every 6 (six) hours as needed., Disp: 8 g, Rfl: 2   Azelastine  HCl 137 MCG/SPRAY SOLN, Place 2 sprays into both nostrils at bedtime as needed., Disp: 30 mL, Rfl: 5   Continuous Glucose Sensor (DEXCOM G6 SENSOR) MISC, Change every 10 days., Disp: 9 each, Rfl: 3   Continuous Glucose Transmitter (DEXCOM G6 TRANSMITTER) MISC, Change every 90 days, Disp: 1 each, Rfl: 2   empagliflozin  (JARDIANCE ) 25 MG TABS tablet, Take 1 tablet (25 mg total) by mouth daily before breakfast., Disp: 90 tablet, Rfl: 1   ezetimibe  (ZETIA ) 10 MG tablet, Take 1 tablet (10 mg total) by mouth daily., Disp: 90 tablet, Rfl: 1   Fluticasone -Umeclidin-Vilant (TRELEGY ELLIPTA ) 100-62.5-25 MCG/ACT AEPB, Inhale 1 puff into the lungs daily., Disp: , Rfl:    Glucagon  (GVOKE HYPOPEN  1-PACK) 1 MG/0.2ML SOAJ, Inject 1 mg into the skin as needed (low blood sugar  with impaired consciousness)., Disp: 0.4 mL, Rfl: 2   insulin  glargine (LANTUS  SOLOSTAR) 100 UNIT/ML Solostar Pen, INJECT SUBCUTANEOUSLY 70 UNITS  AT 6PM (Patient taking differently: Inject 48 Units into the skin daily.), Disp: 75 mL, Rfl: 3   Insulin  Pen Needle (B-D UF III MINI PEN NEEDLES) 31G X 5 MM MISC, USE AS DIRECTED EVERY DAY, Disp: 90 each, Rfl: 3   losartan  (COZAAR ) 50 MG tablet, Take 1 tablet (50 mg total) by mouth daily., Disp: 90 tablet, Rfl: 1   OVER THE COUNTER MEDICATION, Take 4 capsules by mouth daily. Nitric oxide supplement, Disp: , Rfl:    OVER THE COUNTER MEDICATION, Take 1 application by mouth daily as needed. Pre workout vitamin supplement mix, Disp: , Rfl:    pantoprazole  (PROTONIX ) 40 MG tablet, TAKE 1 TABLET BY MOUTH EVERY DAY, Disp: 90 tablet, Rfl: 2   polyethylene glycol (MIRALAX ) 17 g packet, Take 17 g by mouth daily., Disp: 14 each, Rfl: 0   pravastatin  (PRAVACHOL ) 40 MG tablet, TAKE 1 TABLET BY MOUTH EVERY DAY IN THE EVENING, Disp: 30 tablet, Rfl: 8   Protein POWD, Take 1 scoop by mouth daily as needed., Disp: , Rfl:    sildenafil  (REVATIO ) 20 MG tablet, TAKE 3-4 TABLETS BY MOUTH AT BEDTIME AS NEEDED, Disp: 15 tablet, Rfl: 5   tirzepatide  (MOUNJARO ) 7.5 MG/0.5ML Pen, Inject 7.5 mg into the skin once a week., Disp: 6 mL, Rfl: 1

## 2024-02-17 ENCOUNTER — Telehealth: Payer: Self-pay

## 2024-02-17 NOTE — Telephone Encounter (Signed)
-----   Message from [facility] sent at 02/16/2024  5:43 PM EDT ----- Regarding: RE: follow up appt Hi Jon,   Thanks for reaching out.   Sunit   Lonni Robert,  Can you get a follow up appt for him?  ST ----- Message ----- From: Wilfredo Hanly, MD Sent: 02/16/2024  12:22 PM EDT To: Olinda Bertrand, DO Subject: follow up appt                                 Hi Sunit,  Patient has seen you in the past. He had recent HRCT chest with mention of LAD calcifications, so I wanted to make sure he stayed plugged in with you for follow up if you could get him scheduled in the future.  Thanks, Sam Creighton

## 2024-02-17 NOTE — Telephone Encounter (Signed)
 Attempted to call pt to see if we can schedule an appt with Dr. Albert Huff. Unable to reach pt, told to call our office back to schedule appt.

## 2024-02-28 NOTE — Telephone Encounter (Signed)
 Please try again and document our efforts to reach him.  Consider MyChart message and USPS mail.   Affie Gasner Whitefield, DO, [facility]

## 2024-02-28 NOTE — Telephone Encounter (Signed)
 02/28/24 9:15am LVM to schedule overdue f/u with Dr. Albert Huff - LCN

## 2024-03-01 NOTE — Telephone Encounter (Signed)
 Patient scheduled for 06/10 at 8:20am with Dr. Tolia

## 2024-03-14 ENCOUNTER — Ambulatory Visit (INDEPENDENT_AMBULATORY_CARE_PROVIDER_SITE_OTHER): Admitting: "Endocrinology

## 2024-03-14 ENCOUNTER — Other Ambulatory Visit: Payer: Self-pay | Admitting: "Endocrinology

## 2024-03-14 ENCOUNTER — Encounter: Payer: Self-pay | Admitting: "Endocrinology

## 2024-03-14 VITALS — BP 124/70 | HR 94 | Ht 72.0 in | Wt 201.0 lb

## 2024-03-14 DIAGNOSIS — Z794 Long term (current) use of insulin: Secondary | ICD-10-CM | POA: Diagnosis not present

## 2024-03-14 DIAGNOSIS — E7800 Pure hypercholesterolemia, unspecified: Secondary | ICD-10-CM

## 2024-03-14 DIAGNOSIS — Z7985 Long-term (current) use of injectable non-insulin antidiabetic drugs: Secondary | ICD-10-CM | POA: Diagnosis not present

## 2024-03-14 DIAGNOSIS — E1122 Type 2 diabetes mellitus with diabetic chronic kidney disease: Secondary | ICD-10-CM

## 2024-03-14 DIAGNOSIS — E1140 Type 2 diabetes mellitus with diabetic neuropathy, unspecified: Secondary | ICD-10-CM | POA: Diagnosis not present

## 2024-03-14 DIAGNOSIS — N183 Chronic kidney disease, stage 3 unspecified: Secondary | ICD-10-CM | POA: Diagnosis not present

## 2024-03-14 LAB — POCT GLYCOSYLATED HEMOGLOBIN (HGB A1C): Hemoglobin A1C: 6 % — AB (ref 4.0–5.6)

## 2024-03-14 MED ORDER — TIRZEPATIDE 7.5 MG/0.5ML ~~LOC~~ SOAJ: 7.5000 mg | SUBCUTANEOUS | 1 refills | Status: DC

## 2024-03-14 MED ORDER — LANTUS SOLOSTAR 100 UNIT/ML ~~LOC~~ SOPN: 48.0000 [IU] | PEN_INJECTOR | Freq: Every day | SUBCUTANEOUS | 1 refills | Status: AC

## 2024-03-14 MED ORDER — DEXCOM G7 SENSOR MISC: 1.0000 | 3 refills | Status: AC

## 2024-03-14 NOTE — Progress Notes (Signed)
 Outpatient Endocrinology Note Wayne Newcomer, MD  03/14/24   Wayne Webb January 21, 1966 [id number]  Referring Provider: Ezell Hollow, MD Primary Care Provider: Ezell Hollow, MD Reason for consultation: Subjective   Assessment & Plan  Diagnoses and all orders for this visit:  Type 2 diabetes mellitus with diabetic neuropathy, with long-term current use of insulin  (HCC) -     POCT glycosylated hemoglobin (Hb A1C) -     tirzepatide  (MOUNJARO ) 7.5 MG/0.5ML Pen; Inject 7.5 mg into the skin once a week. -     Continuous Glucose Sensor (DEXCOM G7 SENSOR) MISC; 1 Device by Does not apply route continuous. -     insulin  glargine (LANTUS  SOLOSTAR) 100 UNIT/ML Solostar Pen; Inject 48 Units into the skin daily.  Long-term insulin  use (HCC)  Long-term (current) use of injectable non-insulin  antidiabetic drugs  Pure hypercholesterolemia   Diabetes Type II complicated by neuropathy, nephropathy Lab Results  Component Value Date   GFR 26.35 (L) 01/03/2024   Hba1c goal less than 7, current Hba1c is  Lab Results  Component Value Date   HGBA1C 6.0 (A) 03/14/2024   Will recommend the following: Mounjaro  to 7.5 mg once a week (not keen on further weight loss/dose increase) Lantus  at 48 every morning  Insurance doesn't cover Jardiance  25 mg QD No known contraindications to any of above medications Glucagon  ordered with refills on 05/14/23  -Last LD and Tg are as follows: Lab Results  Component Value Date   LDLCALC 48 01/03/2024    Lab Results  Component Value Date   TRIG 108.0 01/03/2024   -On pravastatin  40 mg QD -Follow low fat diet and exercise   -Blood pressure goal <140/90 - Microalbumin/creatinine goal < 30 -Last MA/Cr is as follows: Lab Results  Component Value Date   MICROALBUR 842.6 09/29/2021   -is on ACE/ARB Losartan  50 mg QD -diet changes including salt restriction -limit eating outside -counseled BP targets per standards of diabetes care -uncontrolled  blood pressure can lead to retinopathy, nephropathy and cardiovascular and atherosclerotic heart disease  Reviewed and counseled on: -A1C target -Blood sugar targets -Complications of uncontrolled diabetes  -Checking blood sugar before meals and bedtime and bring log next visit -All medications with mechanism of action and side effects -Hypoglycemia management: rule of 15's, Glucagon  Emergency Kit and medical alert ID -low-carb low-fat plate-method diet -At least 20 minutes of physical activity per day -Annual dilated retinal eye exam and foot exam -compliance and follow up needs -follow up as scheduled or earlier if problem gets worse  Call if blood sugar is less than 70 or consistently above 250    Take a 15 gm snack of carbohydrate at bedtime before you go to sleep if your blood sugar is less than 100.    If you are going to fast after midnight for a test or procedure, ask your physician for instructions on how to reduce/decrease your insulin  dose.    Call if blood sugar is less than 70 or consistently above 250  -Treating a low sugar by rule of 15  (15 gms of sugar every 15 min until sugar is more than 70) If you feel your sugar is low, test your sugar to be sure If your sugar is low (less than 70), then take 15 grams of a fast acting Carbohydrate (3-4 glucose tablets or glucose gel or 4 ounces of juice or regular soda) Recheck your sugar 15 min after treating low to make sure it is more than  70 If sugar is still less than 70, treat again with 15 grams of carbohydrate          Don't drive the hour of hypoglycemia  If unconscious/unable to eat or drink by mouth, use glucagon  injection or nasal spray baqsimi and call 911. Can repeat again in 15 min if still unconscious.  Return in about 4 months (around 07/15/2024).   I have reviewed current medications, nurse's notes, allergies, vital signs, past medical and surgical history, family medical history, and social history for this  encounter. Counseled patient on symptoms, examination findings, lab findings, imaging results, treatment decisions and monitoring and prognosis. The patient understood the recommendations and agrees with the treatment plan. All questions regarding treatment plan were fully answered.  Wayne Newcomer, MD  03/14/24    History of Present Illness Wayne Webb is a 58 y.o. year old male who presents for follow up of Type II diabetes mellitus.  Home diabetes regimen: Mounjaro  7.5 mg weekly (pt is not sure of his dose) Lantus  48 mg in the morning  Insurance doesn't cover Jardiance  25 mg QD  COMPLICATIONS -  MI/Stroke -  retinopathy +  neuropathy +  nephropathy  BLOOD SUGAR DATA  CGM interpretation: At today's visit, we reviewed her CGM downloads. The full report is scanned in the media. Reviewing the CGM trends, BG are well controlled across the day with some highs.  Physical Exam  BP 124/70   Pulse 94   Ht 6' (1.829 m)   Wt 201 lb (91.2 kg)   SpO2 98%   BMI 27.26 kg/m    Constitutional: well developed, well nourished Head: normocephalic, atraumatic Eyes: sclera anicteric, no redness Neck: supple Lungs: normal respiratory effort Neurology: alert and oriented Skin: dry, no appreciable rashes Musculoskeletal: no appreciable defects Psychiatric: normal mood and affect Diabetic Foot Exam - Simple   No data filed     Current Medications Past Medical History:  Diagnosis Date   ALLERGIC RHINITIS 03/15/2007   CKD (chronic kidney disease) stage 3, GFR 30-59 ml/min (HCC)    CRI (chronic renal insufficiency)    CRI, creat 1.4 , renal u/s 04-2011 neg   DIABETES MELLITUS, TYPE II 03/15/2007   Diplopia 12/24/2009   Elevated LFTs    ERECTILE DYSFUNCTION 03/15/2007   GOITER, MULTINODULAR 03/21/2010   HYPERLIPIDEMIA 03/15/2007     Allergies No Known Allergies  Past Medical History Past Medical History:  Diagnosis Date   ALLERGIC RHINITIS 03/15/2007   CKD (chronic  kidney disease) stage 3, GFR 30-59 ml/min (HCC)    CRI (chronic renal insufficiency)    CRI, creat 1.4 , renal u/s 04-2011 neg   DIABETES MELLITUS, TYPE II 03/15/2007   Diplopia 12/24/2009   Elevated LFTs    ERECTILE DYSFUNCTION 03/15/2007   GOITER, MULTINODULAR 03/21/2010   HYPERLIPIDEMIA 03/15/2007    Past Surgical History Past Surgical History:  Procedure Laterality Date   LASIK Left 10/2012    Family History family history includes Diabetes (age of onset: 84) in his mother; Heart disease in his mother; Hypertension in his mother; Prostate cancer (age of onset: 90) in his father.  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    Household: pt and daughter    Social Drivers of Corporate investment banker Strain: Not on file  Food Insecurity: Not on file  Transportation Needs: Not on file  Physical Activity: Not on file  Stress: Not on file  Social Connections: Not on file  Intimate Partner Violence: Not on file    Lab Results  Component Value Date   HGBA1C 6.0 (A) 03/14/2024   HGBA1C 6.2 (A) 09/14/2023   HGBA1C 5.3 12/28/2022   Lab Results  Component Value Date   CHOL 106 01/03/2024   Lab Results  Component Value  Date   HDL 36.50 (L) 01/03/2024   Lab Results  Component Value Date   LDLCALC 48 01/03/2024   Lab Results  Component Value Date   TRIG 108.0 01/03/2024   Lab Results  Component Value Date   CHOLHDL 3 01/03/2024   Lab Results  Component Value Date   CREATININE 2.58 (H) 01/03/2024   Lab Results  Component Value Date   GFR 26.35 (L) 01/03/2024   Lab Results  Component Value Date   MICROALBUR 842.6 09/29/2021      Component Value Date/Time   NA 141 01/03/2024 0916   NA 140 07/14/2023 0000   K 4.4 01/03/2024 0916   CL 106 01/03/2024 0916   CO2 26 01/03/2024 0916   GLUCOSE 121 (H) 01/03/2024 0916   BUN 62 (H) 01/03/2024 0916   BUN 59 (A) 07/14/2023 0000   CREATININE 2.58 (H) 01/03/2024 0916   CALCIUM  10.5 01/03/2024 0916   PROT 7.8 01/03/2024 0916   PROT 7.4 12/22/2021 0803   ALBUMIN 4.5 01/03/2024 0916   ALBUMIN 4.3 12/22/2021 0803   AST 61 (H) 01/03/2024 0916   ALT 66 (H) 01/03/2024 0916   ALKPHOS 92 01/03/2024 0916   BILITOT 0.8 01/03/2024 0916   BILITOT 0.7 12/22/2021 0803   GFRNONAA 28 (L) 12/18/2020 1101   GFRAA 46 09/29/2021 0000      Latest Ref Rng & Units 01/03/2024    9:16 AM 07/14/2023   12:00 AM 06/30/2023    8:32 AM  BMP  Glucose 70 - 99 mg/dL 409   811   BUN 6 - 23 mg/dL 62  59     41   Creatinine 0.40 - 1.50 mg/dL 9.14  2.8     7.82   Sodium 135 - 145 mEq/L 141  140     142   Potassium 3.5 - 5.1 mEq/L 4.4  4.6     4.0   Chloride 96 - 112 mEq/L 106  107     104   CO2 19 - 32 mEq/L 26  28     30    Calcium  8.4 - 10.5 mg/dL 95.6  21.3     08.6      This result is from an external source.  Component Value Date/Time   WBC 4.8 12/28/2022 1136   RBC 3.96 (L) 12/28/2022 1136   HGB 11.8 (A) 07/14/2023 0000   HCT 35.2 (L) 12/28/2022 1136   PLT 173.0 12/28/2022 1136   MCV 88.9 12/28/2022 1136   MCH 29.9 12/18/2020 1101   MCHC 33.5 12/28/2022 1136   RDW 12.9 12/28/2022 1136   LYMPHSABS 0.8 12/28/2022 1136   MONOABS 0.9 12/28/2022 1136    EOSABS 0.4 12/28/2022 1136   BASOSABS 0.0 12/28/2022 1136     Parts of this note may have been dictated using voice recognition software. There may be variances in spelling and vocabulary which are unintentional. Not all errors are proofread. Please notify the [facility] if any discrepancies are noted or if the meaning of any statement is not clear.

## 2024-03-14 NOTE — Patient Instructions (Signed)

## 2024-03-25 ENCOUNTER — Other Ambulatory Visit: Payer: Self-pay | Admitting: Adult Health

## 2024-03-26 ENCOUNTER — Other Ambulatory Visit: Payer: Self-pay | Admitting: Internal Medicine

## 2024-04-04 ENCOUNTER — Ambulatory Visit: Attending: Cardiology | Admitting: Cardiology

## 2024-04-05 ENCOUNTER — Telehealth: Payer: Self-pay

## 2024-04-05 ENCOUNTER — Other Ambulatory Visit (HOSPITAL_COMMUNITY): Payer: Self-pay

## 2024-04-05 NOTE — Telephone Encounter (Signed)
 Pharmacy Patient Advocate Encounter   Received notification from RX Request Messages that prior authorization for Mounjaro  7.5mg  is required/requested.   Insurance verification completed.   The patient is insured through [facility] .   Per test claim: PA required; PA submitted to above mentioned insurance via CoverMyMeds Key/confirmation #/EOC [id number] Status is pending

## 2024-04-18 ENCOUNTER — Other Ambulatory Visit: Payer: Self-pay

## 2024-04-18 DIAGNOSIS — Z794 Long term (current) use of insulin: Secondary | ICD-10-CM

## 2024-04-18 MED ORDER — TRULICITY 4.5 MG/0.5ML ~~LOC~~ SOAJ: 4.5000 mg | SUBCUTANEOUS | 0 refills | Status: DC

## 2024-04-18 NOTE — Progress Notes (Signed)
 Requested Prescriptions   Signed Prescriptions Disp Refills   Dulaglutide  (TRULICITY ) 4.5 MG/0.5ML SOAJ 6 mL 0    Sig: Inject 4.5 mg as directed once a week.

## 2024-04-18 NOTE — Telephone Encounter (Signed)
 Called pt and informed him that insurance Denied mounjaro . Pt was ok was with Trulicity  4.5mg /weekly per Dr dartha recommendation. Rx sent in to the pharmacy.

## 2024-04-18 NOTE — Telephone Encounter (Signed)
 Pharmacy Patient Advocate Encounter  Received notification from [facility] that Prior Authorization for Mounjaro  7.5MG /0.5ML auto-injectors has been DENIED.  Full denial letter will be uploaded to the media tab. See denial reason below.  Your plan only covers this product when you meet one of these options: A) You have tried other products your plan covers (preferred products), and they did not work well for you, or B) Your doctor gives us  a medical reason you cannot take those other products. For your plan, you may need to try up to three preferred products.  The preferred products for your plan are: Trulicity , Victoza/liraglutide, Rybelsus. Your doctor may need to get approval from your plan for preferred products.  PA #/Case ID/Reference #: [id number]

## 2024-04-23 ENCOUNTER — Other Ambulatory Visit: Payer: Self-pay | Admitting: "Endocrinology

## 2024-04-23 DIAGNOSIS — Z794 Long term (current) use of insulin: Secondary | ICD-10-CM

## 2024-04-24 NOTE — Telephone Encounter (Signed)
 Requested Prescriptions   Pending Prescriptions Disp Refills   Continuous Glucose Transmitter (DEXCOM G6 TRANSMITTER) MISC [Pharmacy Med Name: DEXCOM G6 TRANSMITTER] 1 each 2    Sig: CHANGE EVERY 90 DAYS

## 2024-05-31 ENCOUNTER — Ambulatory Visit: Payer: Self-pay | Admitting: Acute Care

## 2024-06-02 NOTE — Progress Notes (Signed)
 Interstitial Lung Disease Multidisciplinary Conference   Wayne Webb    MRN [id number]    DOB [birth year]  Primary Care Physician:Paz, Aloysius BRAVO, MD  Referring Physician: Dr. Kara  Time of Conference: 7.30am- 8.30am Date of conference: 05/31/2024 Location of Conference: -  Virtual  Participating Pulmonary: Dr. Dorethia Cave Pathology: Dr Katrine Muskrat Radiology: Dr Newell Eke Others:   Brief History: 58M followed for stable parenchymal changes on HRCT chest with moderate restriction and moderate diffusion defects on PFTs. He was followed for suspected sarcoidosis 2015-2017, then re-established in clinic in 2022. Inflammatory work up was negative, he did have elevated CK but myositis panel was negative. He has not had a biopsy to prove this is sarcoidosis. No limitations in his daily functions, active going to the gym routinely. I have monitored him over past years with CT and PFTs, which have been stable. Question is does he warrant antifibrotic treatment, or continue with watchful waiting.    PFT    Latest Ref Rng & Units 06/08/2023    8:47 AM 02/19/2015   11:02 AM  PFT Results  FVC-Pre L 2.43  2.64   FVC-Predicted Pre % 47  56   FVC-Post L 2.49  2.72   FVC-Predicted Post % 48  58   Pre FEV1/FVC % % 69  70   Post FEV1/FCV % % 72  72   FEV1-Pre L 1.67  1.84   FEV1-Predicted Pre % 42  49   FEV1-Post L 1.79  1.95   DLCO uncorrected ml/min/mmHg 14.66  19.36   DLCO UNC% % 49  53   DLCO corrected ml/min/mmHg 14.66    DLCO COR %Predicted % 49    DLVA Predicted % 91  99   TLC L 4.40  3.86   TLC % Predicted % 59  51   RV % Predicted % 57  52       MDD discussion of CT scan    - Date or time period of scan: HRCT: 12/29/2023 CT Chest WO: 06/20/2021  - Discussion synopsis:  In Aug 2022: classi mediastinal calcified adnoepathy. In Lung paranchma - more prominent lung basses iwtih HC but is NOT subplerual predomonant. In March 2025L: looks stable. No  proigression. Stable sarcoid since 2022 he is also stable since 2016.   - What is the final conclusion per 2018 ATS/Fleischner Criteria - burnt out sarcoid  - Concordance with official report: Not commented upon   Pathology discussion of biopsy: n/a    MDD Impression/Recs: stable ILD/friboris 2026 -> date. However, check PFT as well for progression. Rule out PAH iof needed based on DLCO/Echo.  If definitely stable - jut monitor. This is NOT UIP/IPF. 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 Dorethia Cave, M.D., F.C.C.P,  Pulmonary and Critical Care Medicine Staff Physician, [facility] Director - Interstitial Lung Disease  Program  Pulmonary Fibrosis [facility] at [facility] [place], [place], [address]  Pager: [phone], If no answer or between  15:00h - 7:00h: call [phone] Telephone: [phone]  1:41 PM 06/02/2024 ...................................................................................................................SABRA References: Diagnosis of Hypersensitivity Pneumonitis in Adults. An Official ATS/JRS/ALAT Clinical Practice Guideline. Ragu G et al, Am J Respir Crit Care Med. 2020 Aug 1;202(3):e36-e69.       Diagnosis of Idiopathic Pulmonary Fibrosis. An Official ATS/ERS/JRS/ALAT Clinical Practice Guideline. Raghu G  et al, Am J Respir Crit Care Med. 2018 Sep 1;198(5):e44-e68.   IPF Suspected   Histopath ology Pattern      UIP  Probable UIP  Indeterminate for  UIP  Alternative  diagnosis    UIP  IPF  IPF  IPF  Non-IPF dx   HRCT   Probabe UIP  IPF  IPF  IPF (Likely)**  Non-IPF dx  Pattern  Indeterminate for UIP  IPF  IPF (Likely)**  Indeterminate  for IPF**  Non-IPF dx    Alternative diagnosis  IPF (Likely)**/ non-IPF dx  Non-IPF dx  Non-IPF dx  Non-IPF dx     Idiopathic pulmonary fibrosis diagnosis based upon  HRCT and Biopsy paterns.  ** IPF is the likely diagnosis when any of following features are present:  Moderate-to-severe traction bronchiectasis/bronchiolectasis (defined as mild traction bronchiectasis/bronchiolectasis in four or more lobes including the lingual as a lobe, or moderate to severe traction bronchiectasis in two or more lobes) in a man over age 60 years or in a woman over age 60 years Extensive (>30%) reticulation on HRCT and an age >70 years  Increased neutrophils and/or absence of lymphocytosis in BAL fluid  Multidisciplinary discussion reaches a confident diagnosis of IPF.   **Indeterminate for IPF  Without an adequate biopsy is unlikely to be IPF  With an adequate biopsy may be reclassified to a more specific diagnosis after multidisciplinary discussion and/or additional consultation.   dx = diagnosis; HRCT = high-resolution computed tomography; IPF = idiopathic pulmonary fibrosis; UIP = usual interstitial pneumonia.

## 2024-06-03 ENCOUNTER — Other Ambulatory Visit: Payer: Self-pay | Admitting: "Endocrinology

## 2024-06-03 DIAGNOSIS — E1122 Type 2 diabetes mellitus with diabetic chronic kidney disease: Secondary | ICD-10-CM

## 2024-06-23 ENCOUNTER — Other Ambulatory Visit: Payer: Self-pay | Admitting: Internal Medicine

## 2024-07-07 ENCOUNTER — Ambulatory Visit: Admitting: Internal Medicine

## 2024-07-11 DIAGNOSIS — E785 Hyperlipidemia, unspecified: Secondary | ICD-10-CM | POA: Diagnosis not present

## 2024-07-11 DIAGNOSIS — R591 Generalized enlarged lymph nodes: Secondary | ICD-10-CM | POA: Diagnosis not present

## 2024-07-11 DIAGNOSIS — I129 Hypertensive chronic kidney disease with stage 1 through stage 4 chronic kidney disease, or unspecified chronic kidney disease: Secondary | ICD-10-CM | POA: Diagnosis not present

## 2024-07-11 DIAGNOSIS — N1832 Chronic kidney disease, stage 3b: Secondary | ICD-10-CM | POA: Diagnosis not present

## 2024-07-11 DIAGNOSIS — E1122 Type 2 diabetes mellitus with diabetic chronic kidney disease: Secondary | ICD-10-CM | POA: Diagnosis not present

## 2024-07-11 DIAGNOSIS — D869 Sarcoidosis, unspecified: Secondary | ICD-10-CM | POA: Diagnosis not present

## 2024-07-11 DIAGNOSIS — I5020 Unspecified systolic (congestive) heart failure: Secondary | ICD-10-CM | POA: Diagnosis not present

## 2024-07-15 ENCOUNTER — Other Ambulatory Visit: Payer: Self-pay | Admitting: Internal Medicine

## 2024-07-17 ENCOUNTER — Encounter: Payer: Self-pay | Admitting: "Endocrinology

## 2024-07-17 ENCOUNTER — Ambulatory Visit: Admitting: "Endocrinology

## 2024-07-17 VITALS — BP 100/60 | HR 74 | Ht 72.0 in | Wt 204.0 lb

## 2024-07-17 DIAGNOSIS — E1122 Type 2 diabetes mellitus with diabetic chronic kidney disease: Secondary | ICD-10-CM

## 2024-07-17 DIAGNOSIS — N183 Chronic kidney disease, stage 3 unspecified: Secondary | ICD-10-CM | POA: Diagnosis not present

## 2024-07-17 DIAGNOSIS — E7800 Pure hypercholesterolemia, unspecified: Secondary | ICD-10-CM | POA: Diagnosis not present

## 2024-07-17 DIAGNOSIS — Z794 Long term (current) use of insulin: Secondary | ICD-10-CM

## 2024-07-17 DIAGNOSIS — E1165 Type 2 diabetes mellitus with hyperglycemia: Secondary | ICD-10-CM | POA: Diagnosis not present

## 2024-07-17 LAB — POCT GLYCOSYLATED HEMOGLOBIN (HGB A1C): Hemoglobin A1C: 6.3 % — AB (ref 4.0–5.6)

## 2024-07-17 MED ORDER — INSULIN LISPRO (1 UNIT DIAL) 100 UNIT/ML (KWIKPEN): 1.0000 [IU] | PEN_INJECTOR | Freq: Three times a day (TID) | SUBCUTANEOUS | 1 refills | Status: AC

## 2024-07-17 MED ORDER — SEMAGLUTIDE(0.25 OR 0.5MG/DOS) 2 MG/3ML ~~LOC~~ SOPN: 0.2500 mg | PEN_INJECTOR | SUBCUTANEOUS | 1 refills | Status: AC

## 2024-07-17 NOTE — Telephone Encounter (Signed)
 Rx's denied. Pt informed last month he needed an appt. See below.   Me to TALLIS SOLEDAD     06/23/24  7:51 AM Good morning, Per our records you are due for an appointment with Dr. Amon. Please call the office to schedule an appointment at your earliest convenience.   Thank you, [facility]  Last read by Alm GORMAN Loge at 8:55AM on 06/24/2024.

## 2024-07-17 NOTE — Progress Notes (Signed)
 Outpatient Endocrinology Note Wayne Birmingham, MD  07/17/24   Wayne Webb 1966-09-29 [id number]  Referring Provider: Amon Aloysius BRAVO, MD Primary Care Provider: Amon Aloysius BRAVO, MD Reason for consultation: Subjective   Assessment & Plan  Diagnoses and all orders for this visit:  Uncontrolled type 2 diabetes mellitus with hyperglycemia (HCC) -     POCT glycosylated hemoglobin (Hb A1C) -     Semaglutide ,0.25 or 0.5MG /DOS, 2 MG/3ML SOPN; Inject 0.25 mg into the skin once a week. -     insulin  lispro (HUMALOG  KWIKPEN) 100 UNIT/ML KwikPen; Inject 1-10 Units into the skin 3 (three) times daily.  Long-term insulin  use (HCC)  Pure hypercholesterolemia   Diabetes Type II complicated by neuropathy, nephropathy Lab Results  Component Value Date   GFR 26.35 (L) 01/03/2024   Hba1c goal less than 7, current Hba1c is  Lab Results  Component Value Date   HGBA1C 6.3 (A) 07/17/2024   Will recommend the following: Ozempic  0.25mg /week Lantus  48 every morning Humalog  correction scale: Use 15 min before meals based on blood sugars as follows:  201 - 225: 3 units 226 - 250: 4 units 251 - 275: 5 units 276 - 300: 6 units 301 - 325: 7 units 326 - 350: 8 units 351 - 375: 9 units 376 - 400: 10 units    No known contraindications to any of above medications Insurance doesn't cover Jardiance /mounjaro /trulicity  Glucagon  ordered with refills on 05/14/23  -Last LD and Tg are as follows: Lab Results  Component Value Date   LDLCALC 48 01/03/2024    Lab Results  Component Value Date   TRIG 108.0 01/03/2024   -On pravastatin  40 mg QD -Follow low fat diet and exercise   -Blood pressure goal <140/90 - Microalbumin/creatinine goal < 30 -Last MA/Cr is as follows: Lab Results  Component Value Date   MICROALBUR 842.6 09/29/2021   -is on ACE/ARB Losartan  50 mg QD -diet changes including salt restriction -limit eating outside -counseled BP targets per standards of diabetes  care -uncontrolled blood pressure can lead to retinopathy, nephropathy and cardiovascular and atherosclerotic heart disease  Reviewed and counseled on: -A1C target -Blood sugar targets -Complications of uncontrolled diabetes  -Checking blood sugar before meals and bedtime and bring log next visit -All medications with mechanism of action and side effects -Hypoglycemia management: rule of 15's, Glucagon  Emergency Kit and medical alert ID -low-carb low-fat plate-method diet -At least 20 minutes of physical activity per day -Annual dilated retinal eye exam and foot exam -compliance and follow up needs -follow up as scheduled or earlier if problem gets worse  Call if blood sugar is less than 70 or consistently above 250    Take a 15 gm snack of carbohydrate at bedtime before you go to sleep if your blood sugar is less than 100.    If you are going to fast after midnight for a test or procedure, ask your physician for instructions on how to reduce/decrease your insulin  dose.    Call if blood sugar is less than 70 or consistently above 250  -Treating a low sugar by rule of 15  (15 gms of sugar every 15 min until sugar is more than 70) If you feel your sugar is low, test your sugar to be sure If your sugar is low (less than 70), then take 15 grams of a fast acting Carbohydrate (3-4 glucose tablets or glucose gel or 4 ounces of juice or regular soda) Recheck your sugar 15 min after  treating low to make sure it is more than 70 If sugar is still less than 70, treat again with 15 grams of carbohydrate          Don't drive the hour of hypoglycemia  If unconscious/unable to eat or drink by mouth, use glucagon  injection or nasal spray baqsimi and call 911. Can repeat again in 15 min if still unconscious.  Return in about 3 months (around 10/16/2024).   I have reviewed current medications, nurse's notes, allergies, vital signs, past medical and surgical history, family medical history, and social  history for this encounter. Counseled patient on symptoms, examination findings, lab findings, imaging results, treatment decisions and monitoring and prognosis. The patient understood the recommendations and agrees with the treatment plan. All questions regarding treatment plan were fully answered.  Wayne Birmingham, MD  07/17/24    History of Present Illness Wayne Webb is a 58 y.o. year old male who presents for follow up of Type II diabetes mellitus.  Home diabetes regimen: Stopped Mounjaro  7.5 mg weekly due to insurance issues  Lantus  48 mg in the morning  Insurance doesn't cover Jardiance /mounjaro /trulicity   COMPLICATIONS -  MI/Stroke -  retinopathy +  neuropathy +  nephropathy  BLOOD SUGAR DATA CGM interpretation: At today's visit, we reviewed her CGM downloads. The full report is scanned in the media. Reviewing the CGM trends, BG are well controlled across the day with some highs between 3-5pm and 9pm-3am.  Physical Exam  BP 100/60   Pulse 74   Ht 6' (1.829 m)   Wt 204 lb (92.5 kg)   SpO2 98%   BMI 27.67 kg/m    Constitutional: well developed, well nourished Head: normocephalic, atraumatic Eyes: sclera anicteric, no redness Neck: supple Lungs: normal respiratory effort Neurology: alert and oriented Skin: dry, no appreciable rashes Musculoskeletal: no appreciable defects Psychiatric: normal mood and affect Diabetic Foot Exam - Simple   Simple Foot Form Diabetic Foot exam was performed with the following findings: Yes 07/17/2024  8:40 AM  Visual Inspection No deformities, no ulcerations, no other skin breakdown bilaterally: Yes Sensation Testing Pulse Check Posterior Tibialis and Dorsalis pulse intact bilaterally: Yes Comments Monofilament: 2/3 B/L     Current Medications Past Medical History:  Diagnosis Date   ALLERGIC RHINITIS 03/15/2007   CKD (chronic kidney disease) stage 3, GFR 30-59 ml/min (HCC)    CRI (chronic renal insufficiency)    CRI,  creat 1.4 , renal u/s 04-2011 neg   DIABETES MELLITUS, TYPE II 03/15/2007   Diplopia 12/24/2009   Elevated LFTs    ERECTILE DYSFUNCTION 03/15/2007   GOITER, MULTINODULAR 03/21/2010   HYPERLIPIDEMIA 03/15/2007     Allergies No Known Allergies  Past Medical History Past Medical History:  Diagnosis Date   ALLERGIC RHINITIS 03/15/2007   CKD (chronic kidney disease) stage 3, GFR 30-59 ml/min (HCC)    CRI (chronic renal insufficiency)    CRI, creat 1.4 , renal u/s 04-2011 neg   DIABETES MELLITUS, TYPE II 03/15/2007   Diplopia 12/24/2009   Elevated LFTs    ERECTILE DYSFUNCTION 03/15/2007   GOITER, MULTINODULAR 03/21/2010   HYPERLIPIDEMIA 03/15/2007    Past Surgical History Past Surgical History:  Procedure Laterality Date   LASIK Left 10/2012    Family History family history includes Diabetes (age of onset: 68) in his mother; Heart disease in his mother; Hypertension in his mother; Prostate cancer (age of onset: 61) in his father.  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    Household: pt and daughter    Social Drivers of Corporate investment banker Strain: Not on file  Food Insecurity: Not on file  Transportation  Needs: Not on file  Physical Activity: Not on file  Stress: Not on file  Social Connections: Not on file  Intimate Partner Violence: Not on file    Lab Results  Component Value Date   HGBA1C 6.3 (A) 07/17/2024   HGBA1C 6.0 (A) 03/14/2024   HGBA1C 6.2 (A) 09/14/2023   Lab Results  Component Value Date   CHOL 106 01/03/2024   Lab Results  Component Value Date   HDL 36.50 (L) 01/03/2024   Lab Results  Component Value Date   LDLCALC 48 01/03/2024   Lab Results  Component Value Date   TRIG 108.0 01/03/2024   Lab Results  Component Value Date   CHOLHDL 3 01/03/2024   Lab Results  Component Value Date   CREATININE 2.58 (H) 01/03/2024   Lab Results  Component Value Date   GFR 26.35 (L) 01/03/2024   Lab Results  Component Value Date   MICROALBUR 842.6 09/29/2021      Component Value Date/Time   NA 141 01/03/2024 0916   NA 140 07/14/2023 0000   K 4.4 01/03/2024 0916   CL 106 01/03/2024 0916   CO2 26 01/03/2024 0916   GLUCOSE 121 (H) 01/03/2024 0916   BUN 62 (H) 01/03/2024 0916   BUN 59 (A) 07/14/2023 0000   CREATININE 2.58 (H) 01/03/2024 0916   CALCIUM  10.5 01/03/2024 0916   PROT 7.8 01/03/2024 0916   PROT 7.4 12/22/2021 0803   ALBUMIN 4.5 01/03/2024 0916   ALBUMIN 4.3 12/22/2021 0803   AST 61 (H) 01/03/2024 0916   ALT 66 (H) 01/03/2024 0916   ALKPHOS 92 01/03/2024 0916   BILITOT 0.8 01/03/2024 0916   BILITOT 0.7 12/22/2021 0803   GFRNONAA 28 (L) 12/18/2020 1101   GFRAA 46 09/29/2021 0000      Latest Ref Rng & Units 01/03/2024    9:16 AM 07/14/2023   12:00 AM 06/30/2023    8:32 AM  BMP  Glucose 70 - 99 mg/dL 878   894   BUN 6 - 23 mg/dL 62  59     41   Creatinine 0.40 - 1.50 mg/dL 7.41  2.8     7.56   Sodium 135 - 145 mEq/L 141  140     142   Potassium 3.5 - 5.1 mEq/L 4.4  4.6     4.0   Chloride 96 - 112 mEq/L 106  107     104   CO2 19 - 32 mEq/L 26  28     30    Calcium  8.4 - 10.5 mg/dL 89.4  89.3     89.8      This result is from an external  source.       Component Value Date/Time   WBC 4.8 12/28/2022 1136   RBC 3.96 (L) 12/28/2022 1136   HGB 11.8 (A) 07/14/2023 0000   HCT 35.2 (L) 12/28/2022 1136   PLT 173.0 12/28/2022 1136   MCV 88.9 12/28/2022 1136   MCH 29.9 12/18/2020 1101   MCHC 33.5 12/28/2022 1136   RDW 12.9 12/28/2022 1136   LYMPHSABS 0.8 12/28/2022 1136   MONOABS 0.9 12/28/2022 1136   EOSABS 0.4 12/28/2022 1136   BASOSABS 0.0 12/28/2022 1136     Parts of this note may have been dictated using voice recognition software. There may be variances in spelling and vocabulary which are unintentional. Not all errors are proofread. Please notify the dino if any discrepancies are noted or if the meaning of any statement is not clear.

## 2024-07-17 NOTE — Patient Instructions (Addendum)
 Humalog  correction scale: Use 15 min before meals based on blood sugars as follows:  201 - 225: 3 units 226 - 250: 4 units 251 - 275: 5 units 276 - 300: 6 units 301 - 325: 7 units 326 - 350: 8 units 351 - 375: 9 units 376 - 400: 10 units

## 2024-07-19 ENCOUNTER — Encounter: Payer: Self-pay | Admitting: "Endocrinology

## 2024-08-01 ENCOUNTER — Other Ambulatory Visit: Payer: Self-pay | Admitting: "Endocrinology

## 2024-08-01 DIAGNOSIS — E1122 Type 2 diabetes mellitus with diabetic chronic kidney disease: Secondary | ICD-10-CM

## 2024-08-19 ENCOUNTER — Other Ambulatory Visit: Payer: Self-pay | Admitting: Internal Medicine

## 2024-09-08 ENCOUNTER — Encounter: Payer: Self-pay | Admitting: "Endocrinology

## 2024-09-08 NOTE — Telephone Encounter (Signed)
 Called pt regarding mychart message, Advise pt to go to urgent care to get his toe check out to make sure it not broken. Pt understood and say that he would go get toe looked at.

## 2024-09-12 ENCOUNTER — Encounter: Payer: Self-pay | Admitting: Podiatry

## 2024-09-12 ENCOUNTER — Ambulatory Visit: Admitting: Podiatry

## 2024-09-12 DIAGNOSIS — S99921A Unspecified injury of right foot, initial encounter: Secondary | ICD-10-CM

## 2024-09-12 DIAGNOSIS — E119 Type 2 diabetes mellitus without complications: Secondary | ICD-10-CM

## 2024-09-12 DIAGNOSIS — S99922A Unspecified injury of left foot, initial encounter: Secondary | ICD-10-CM | POA: Diagnosis not present

## 2024-09-12 DIAGNOSIS — Z0189 Encounter for other specified special examinations: Secondary | ICD-10-CM | POA: Diagnosis not present

## 2024-09-12 NOTE — Progress Notes (Signed)
  Subjective:  Patient ID: Wayne Webb, male    DOB: 04-14-1966,   MRN: [id number]  Chief Complaint  Patient presents with   Diabetes    I went back to work and a cart ran over my toe.  It's been about two weeks, my toe turned black.  It's on both feet.  One happened on the Wednesday and the other happened on a Thursday.  Saw Dr. Obadiah Birmingham - 07/17/2024; A1c - 6.3    58 y.o. male presents for concern of bilateral toe injuries that have happened in the past couple weeks.  Relates last Wednesday and Thursday his left foot and then his right foot run over with a cart.  He relates he works at [employer].  He has had darkening of the toe since then.  He wanted to have them checked out.  He is diabetic and his last A1c was 6.3.. Denies any other pedal complaints. Denies n/v/f/c.   Past Medical History:  Diagnosis Date   ALLERGIC RHINITIS 03/15/2007   CKD (chronic kidney disease) stage 3, GFR 30-59 ml/min (HCC)    CRI (chronic renal insufficiency)    CRI, creat 1.4 , renal u/s 04-2011 neg   DIABETES MELLITUS, TYPE II 03/15/2007   Diplopia 12/24/2009   Elevated LFTs    ERECTILE DYSFUNCTION 03/15/2007   GOITER, MULTINODULAR 03/21/2010   HYPERLIPIDEMIA 03/15/2007    Objective:  Physical Exam: Vascular: DP/PT pulses 2/4 bilateral. CFT <3 seconds. Normal hair growth on digits. No edema.  Skin. No lacerations or abrasions bilateral feet.  Nails 1 through 5 bilateral normal in length and appearance.  Of note bilateral second digits with darkened discoloration.  Noted hemorrhage underlying the nail.  Nail well adhered to nailbed.  No erythema edema or purulence noted. Musculoskeletal: MMT 5/5 bilateral lower extremities in DF, PF, Inversion and Eversion. Deceased ROM in DF of ankle joint.  Neurological: Sensation intact to light touch.   Assessment:   1. Injury of toe on right foot, initial encounter   2. Toe injury, left, initial encounter   3. Encounter for diabetic foot exam (HCC)   4.  Type 2 diabetes mellitus without complication, without long-term current use of insulin  (HCC)      Plan:  Patient was evaluated and treated and all questions answered. -Discussed and educated patient on diabetic foot care, especially with  regards to the vascular, neurological and musculoskeletal systems.  -Stressed the importance of good glycemic control and the detriment of not  controlling glucose levels in relation to the foot. -Discussed supportive shoes at all times and checking feet regularly.  - Discussed bruising of the nails bilateral.  Currently no concern for acute infection or fracture of the toe.  Advised with time the discoloration should grow out.  Discussed there is the possibility that the nails can grow and thicker and dystrophic at this time. -Answered all patient questions -Patient to return in 1 year for diabetic foot check -Patient advised to call the office if any problems or questions arise in the meantime.   Asberry Failing, DPM

## 2024-10-25 ENCOUNTER — Other Ambulatory Visit: Payer: Self-pay | Admitting: "Endocrinology

## 2024-10-25 DIAGNOSIS — Z794 Long term (current) use of insulin: Secondary | ICD-10-CM

## 2024-10-31 ENCOUNTER — Ambulatory Visit: Admitting: "Endocrinology

## 2024-11-22 ENCOUNTER — Other Ambulatory Visit: Payer: Self-pay | Admitting: Internal Medicine

## 2024-11-22 NOTE — Telephone Encounter (Signed)
 Rx's denied. Pt informed last month he needed an appt. See below.   Me to TALLIS SOLEDAD     06/23/24  7:51 AM Good morning, Per our records you are due for an appointment with Dr. Amon. Please call the office to schedule an appointment at your earliest convenience.   Thank you, [facility]  Last read by Alm GORMAN Loge at 8:55AM on 06/24/2024.

## 2024-11-22 NOTE — Telephone Encounter (Signed)
 See below. Pt was informed back in August 2025 that he was overdue for appt. Rx's denied.
